# Patient Record
Sex: Female | Born: 1935 | Race: White | Hispanic: No | Smoking: Never smoker
Health system: Southern US, Community
[De-identification: ages and names within clinical notes are randomized; demographics above are authoritative.]

## PROBLEM LIST (undated history)

## (undated) DIAGNOSIS — J189 Pneumonia, unspecified organism: Secondary | ICD-10-CM

## (undated) DIAGNOSIS — I8390 Asymptomatic varicose veins of unspecified lower extremity: Secondary | ICD-10-CM

## (undated) DIAGNOSIS — H8110 Benign paroxysmal vertigo, unspecified ear: Secondary | ICD-10-CM

## (undated) DIAGNOSIS — R252 Cramp and spasm: Secondary | ICD-10-CM

## (undated) DIAGNOSIS — I10 Essential (primary) hypertension: Secondary | ICD-10-CM

## (undated) DIAGNOSIS — I639 Cerebral infarction, unspecified: Secondary | ICD-10-CM

## (undated) DIAGNOSIS — R112 Nausea with vomiting, unspecified: Secondary | ICD-10-CM

## (undated) DIAGNOSIS — Z8489 Family history of other specified conditions: Secondary | ICD-10-CM

## (undated) DIAGNOSIS — M719 Bursopathy, unspecified: Secondary | ICD-10-CM

## (undated) DIAGNOSIS — Z9889 Other specified postprocedural states: Secondary | ICD-10-CM

## (undated) DIAGNOSIS — A0472 Enterocolitis due to Clostridium difficile, not specified as recurrent: Secondary | ICD-10-CM

## (undated) DIAGNOSIS — M1990 Unspecified osteoarthritis, unspecified site: Secondary | ICD-10-CM

## (undated) HISTORY — PX: COLONOSCOPY: SHX174

## (undated) HISTORY — DX: Bursopathy, unspecified: M71.9

## (undated) HISTORY — PX: TUBAL LIGATION: SHX77

## (undated) HISTORY — PX: HERNIA REPAIR: SHX51

## (undated) HISTORY — DX: Cerebral infarction, unspecified: I63.9

## (undated) HISTORY — PX: ABDOMINAL HYSTERECTOMY: SHX81

## (undated) HISTORY — DX: Unspecified osteoarthritis, unspecified site: M19.90

## (undated) HISTORY — PX: KYPHOSIS SURGERY: SHX114

## (undated) HISTORY — DX: Essential (primary) hypertension: I10

## (undated) HISTORY — DX: Asymptomatic varicose veins of unspecified lower extremity: I83.90

## (undated) HISTORY — DX: Benign paroxysmal vertigo, unspecified ear: H81.10

## (undated) HISTORY — PX: APPENDECTOMY: SHX54

## (undated) HISTORY — PX: CATARACT EXTRACTION: SUR2

---

## 2004-03-22 ENCOUNTER — Emergency Department (HOSPITAL_COMMUNITY): Admission: EM | Admit: 2004-03-22 | Discharge: 2004-03-22 | Payer: Self-pay | Admitting: Emergency Medicine

## 2004-03-24 ENCOUNTER — Emergency Department (HOSPITAL_COMMUNITY): Admission: EM | Admit: 2004-03-24 | Discharge: 2004-03-24 | Payer: Self-pay | Admitting: Family Medicine

## 2005-01-13 ENCOUNTER — Encounter: Admission: RE | Admit: 2005-01-13 | Discharge: 2005-01-13 | Payer: Self-pay | Admitting: Geriatric Medicine

## 2005-02-09 ENCOUNTER — Encounter: Admission: RE | Admit: 2005-02-09 | Discharge: 2005-02-09 | Payer: Self-pay | Admitting: Geriatric Medicine

## 2005-04-15 ENCOUNTER — Encounter: Admission: RE | Admit: 2005-04-15 | Discharge: 2005-04-15 | Payer: Self-pay | Admitting: Geriatric Medicine

## 2005-04-24 ENCOUNTER — Ambulatory Visit (HOSPITAL_COMMUNITY): Admission: RE | Admit: 2005-04-24 | Discharge: 2005-04-24 | Payer: Self-pay | Admitting: Geriatric Medicine

## 2005-04-24 ENCOUNTER — Encounter (INDEPENDENT_AMBULATORY_CARE_PROVIDER_SITE_OTHER): Payer: Self-pay | Admitting: Specialist

## 2005-05-08 ENCOUNTER — Encounter: Payer: Self-pay | Admitting: Interventional Radiology

## 2005-06-29 ENCOUNTER — Encounter: Payer: Self-pay | Admitting: Interventional Radiology

## 2005-07-02 ENCOUNTER — Ambulatory Visit (HOSPITAL_COMMUNITY): Admission: RE | Admit: 2005-07-02 | Discharge: 2005-07-02 | Payer: Self-pay | Admitting: Interventional Radiology

## 2006-07-09 ENCOUNTER — Encounter: Admission: RE | Admit: 2006-07-09 | Discharge: 2006-07-09 | Payer: Self-pay | Admitting: Geriatric Medicine

## 2006-10-20 ENCOUNTER — Encounter: Admission: RE | Admit: 2006-10-20 | Discharge: 2006-10-20 | Payer: Self-pay | Admitting: General Surgery

## 2006-10-21 ENCOUNTER — Ambulatory Visit (HOSPITAL_BASED_OUTPATIENT_CLINIC_OR_DEPARTMENT_OTHER): Admission: RE | Admit: 2006-10-21 | Discharge: 2006-10-21 | Payer: Self-pay | Admitting: General Surgery

## 2007-07-12 ENCOUNTER — Encounter: Admission: RE | Admit: 2007-07-12 | Discharge: 2007-07-12 | Payer: Self-pay | Admitting: Geriatric Medicine

## 2007-10-04 ENCOUNTER — Encounter: Admission: RE | Admit: 2007-10-04 | Discharge: 2007-10-04 | Payer: Self-pay | Admitting: Geriatric Medicine

## 2007-10-15 ENCOUNTER — Inpatient Hospital Stay (HOSPITAL_COMMUNITY): Admission: EM | Admit: 2007-10-15 | Discharge: 2007-10-29 | Payer: Self-pay | Admitting: Emergency Medicine

## 2007-10-19 ENCOUNTER — Encounter (INDEPENDENT_AMBULATORY_CARE_PROVIDER_SITE_OTHER): Payer: Self-pay | Admitting: Gastroenterology

## 2009-02-25 ENCOUNTER — Encounter: Admission: RE | Admit: 2009-02-25 | Discharge: 2009-02-25 | Payer: Self-pay | Admitting: Geriatric Medicine

## 2010-01-09 ENCOUNTER — Encounter: Admission: RE | Admit: 2010-01-09 | Discharge: 2010-01-09 | Payer: Self-pay | Admitting: Geriatric Medicine

## 2011-05-12 NOTE — Op Note (Signed)
[name], [name] NO.:  [id number]   MEDICAL RECORD NO.:  [id number]          PATIENT TYPE:  INP   LOCATION:  6709                         FACILITY:  [facility]   PHYSICIAN:  Shirley Friar, MDDATE OF BIRTH:  August 22, 1928   DATE OF PROCEDURE:  10/19/2007  DATE OF DISCHARGE:                               OPERATIVE REPORT   PROCEDURE:  Colonoscopy.   INDICATIONS:  Chronic diarrhea.  Colonoscopy.   MEDICATIONS:  Fentanyl 75 mcg IV, Versed 2.5 mg IV.   FINDINGS:  A pediatric Pentax colonoscope was inserted into an  inadequately prepped colon and advanced to the cecum.  On insertion of  the colonoscope, there were scattered pseudomembranes noted in the  sigmoid colon and descending colon.  There were also pseudomembranes  noted in the rectum.  There was diffuse edema in the sigmoid colon as  well as scattered diverticula seen.  The colonoscope was easily advanced  through the colon down to the cecum where the ileocecal valve and  appendiceal orifice were identified.  In the cecum, were a few  pseudomembranes with white colored plaques as noted on the left side as  well.  In the ascending colon and transverse colon, there were no  identified pseudomembranes although the prep was not adequate to screen  the colon.  No mass lesions were noted and no obstruction was seen.  Random biopsies were taken on the right side and left side of the colon  for histology.   ASSESSMENT:  1. Pseudomembranous colitis mainly in the sigmoid colon but also noted      in cecum.  2. Edematous sigmoid colon secondary to pseudomembranous colitis.  3. Sigmoid diverticulosis.   PLAN:  1. Change Flagyl to 500 mg IV q.8h.  2. Continue p.o. vancomycin 250 mg p.o. q.i.d.  3. Clear liquid diet and advance as tolerated.  4. Follow-up on pathology.      Shirley Friar, MD  Electronically Signed     VCS/MEDQ  D:  10/19/2007  T:  10/20/2007  Job:  [id number]   cc:   Hal T.  Stoneking, M.D.

## 2011-05-12 NOTE — Consult Note (Signed)
[name], [name] NO.:  [id number]   MEDICAL RECORD NO.:  [id number]          PATIENT TYPE:  INP   LOCATION:  6709                         FACILITY:  [facility]   PHYSICIAN:  Shirley Friar, MDDATE OF BIRTH:  Jun 02, 1928   DATE OF CONSULTATION:  10/17/2007  DATE OF DISCHARGE:                                 CONSULTATION   HISTORY OF PRESENT ILLNESS:  Dr. Bosie Clos was asked to see Ms.  [name] today by Dr. Adela Glimpse in consultation for chronic diarrhea,  possible C.  Diff colitis.   HISTORY OF PRESENT ILLNESS:  This is a 77 year old female who reports  that prior to one month ago her bowel movements were soft and once a  day.  Since that time, she was bitten by a dog in her right hand and  suffered severe swelling.  She was started on amoxicillin which caused  severe diarrhea.  The amoxicillin was changed to clindamycin.  The  diarrhea continued.  Her stools are now very urgent and frequent.  She  has 10 or more per day.  They are semisolid.  They are yellow-brown in  color.  There is no blood, but they are guaiac-positive.  Today she is  positive for fever.  She is complaining of abdominal distention,  cramping in her groin.  She says I feel like I have a blockage in my  rectum, and I also am sore from straining.  She has no previous history  of such symptoms, her only vomiting was secondary to Percocet that was  given on Saturday.  She has been on Flagyl for the past 3 days.  Her  right hand is still very painful from her dog bite, and recent MRI shows  inflammation.  She was supposed to see Dr. Amanda Pea as an outpatient for a  biopsy.   PAST MEDICAL HISTORY:  1. Osteoporosis and chronic pain.  2. Mild hypertension.  3. Inguinal hernia repair.  4. Hysterectomy.  5. Bilateral tubal ligation.  6. Cataract surgery.  7. Appendectomy.  8. Shoulder surgery.  9. Vertigo approximately one month ago that she says it is better now.  10.Primary care physician is  Dr. Merlene Laughter.  She has never had a      colonoscopy despite by Dr. Laverle Hobby recommendation.   ALLERGIES:  PENICILLIN, AMOXICILLIN, CLINDAMYCIN AND CIPRO.  SHE ALSO  HAS ALLERGIES TO MANY NARCOTICS PERCOCET IS ONE OF THEM.   CURRENT MEDICATIONS:  Premarin, HCTZ, Fosamax, multivitamin, Flagyl x3  days and Advil.   REVIEW OF SYSTEMS:  Significant for severe osteoarthritis pain and also  pain and inflammation in her right hand.   SOCIAL HISTORY:  She lives alone.  She has no history of drug, alcohol  or tobacco use.   FAMILY HISTORY:  Negative for colon cancer.   PHYSICAL EXAMINATION:  GENERAL:  She is alert, oriented and in no  apparent distress.  HEART:  Regular rate and rhythm.  LUNGS:  Clear to auscultation bilaterally.  ABDOMEN:  Distended, somewhat firm, tender in the lower quadrant to mild  palpation.  She has decreased bowel sounds.  CURRENT VITAL SIGNS:  Her  temperature right now is 102.0, pulse 104,  respirations 19, blood pressure is 118/69.   LABORATORY DATA:  Labs show a sodium 133, potassium 3.7, chloride 101,  bicarb 23, BUN 5, creatinine 0.82, glucose 95.  Her hemoglobin is 11.1,  white count 9.8.  Her free T3 is 1.9.  She has been guaiac positive  times four on October 18 and 19.  Current radiological studies are a CT  that was done on October 18 that showed the patient was constipated, but  with no obstruction in her colon.  She has extensive sigmoid  diverticulosis.  X-ray of her abdomen on the 18th showed constipation as  well.   ASSESSMENT:  Dr. Charlott Rakes has seen and examined the patient,  collected a history and reviewed the chart.  His impression is that this  is a 77 year old female who is experiencing severe constipation despite  her loose stools which may very well be overflow diarrhea or C. diff  colitis.  These stools are guaiac-positive and she currently has fever.  Stool cultures are pending.  The patient will need laxatives to help   clear her colon.  A possible ID consult may be necessary considering her  many antibiotic allergies if her C diff stool studies come back  positive.      Stephani Police, [place]      Shirley Friar, MD  Electronically Signed    MLY/MEDQ  D:  10/17/2007  T:  10/18/2007  Job:  [id number]   cc:   Hal T. Stoneking, M.D.

## 2011-05-12 NOTE — H&P (Signed)
NAMEJAYLIANI, Kimberly NO.:  [id number]   MEDICAL RECORD NO.:  [id number]          PATIENT TYPE:  EMS   LOCATION:  MAJO                         FACILITY:  [facility]   PHYSICIAN:  [name], MDDATE OF BIRTH:  1928/07/23   DATE OF ADMISSION:  10/15/2007  DATE OF DISCHARGE:                              HISTORY & PHYSICAL   PRIMARY CARE PHYSICIAN:  Dr. Pete Glatter.   HISTORY OF PRESENT ILLNESS:  This is a 77 year old female with past  medical history significant for mild hypertension and recent antibiotic  use for a dog bite, presents with refractory diarrhea for 4 weeks  despite use of Imodium.   One month ago, the patient had sustained a dog bite and was started on  amoxicillin.  She developed diarrhea shortly thereafter, and took some  Imodium and then got better.  The patient was seen again in the office  and was given clindamycin and Cipro for her hand, and shortly thereafter  developed diarrhea again, which she attempted to treat with Imodium with  some improvement.  Then, one week ago, the patient started to have some  nausea, and then rapidly developed mucus diarrhea with no blood in  stools, as well as chills at night.  The patient was seen by Dr.  Laverle Hobby office, and was prescribed Flagyl for what presumptively is  possible C. diff.  The patient was also given Lomotil to control her  diarrhea, which has not helped.  Now, the patient presents at the  emergency department because her diarrhea has not improved, and she has  not been able to tolerate much p.o. for the past one week.   ALLERGIES:  PENICILLIN.   MEDICATIONS:  1. Premarin 0.45 mg p.o. daily.  2. Hydrochlorothiazide 12.5 mg p.o. daily.  3. Fosamax 70 mg p.o. once a week.  4. Multivitamins.  5. Flagyl.  6. Advil.   PAST MEDICAL HISTORY:  Significant for mild hypertension.   REVIEW OF SYSTEMS:  Negative for fever, but does report chills.  Negative for chest pain, shortness of  breath.  Occasional headache one  week ago, but currently improved.  No shortness of breath.  No  orthostasis, but does have occasional vertigo.  Denies cough.  Denies  dysuria.  No neurological abnormalities.  Reports nausea and vomiting,  as well as diarrhea per HPI.  Denies melena or red blood per rectum.   SOCIAL HISTORY:  Denies tobacco or alcohol.  Lives at home.  No drug  use.   FAMILY HISTORY:  Noncontributory.   PHYSICAL EXAMINATION:  VITAL SIGNS:  Temperature 98.7, pulse 103,  respirations 16, blood pressure 94/82, improved to 122/85 with fluids.  GENERAL:  Well-appearing female in no acute distress.  HEENT:  Dry mucous membranes.  HEART:  No murmurs, rubs or gallops.  Regular, but rapid.  LUNGS:  Clear to auscultation bilaterally.  SKIN:  No rashes.  ABDOMEN:  Tender over left lower quadrant, as well as somewhat right  lower quadrant.  No guarding.  No peritoneal signs.  LOWER EXTREMITIES:  Without edema.  RECTAL:  Trace heme-positive.  NEUROLOGICAL:  Nonfocal.   RADIOLOGICAL STUDIES:  KUB and chest possible COPD, nonspecific gas  pattern, possible left wall infusion and basil atelectasis.   LABORATORY DATA:  White blood cell count 7.0, H&H 11.2 and 33.6,  platelets 352.  Sodium 132, potassium 3.5, creatinine 1.0.   ASSESSMENT AND PLAN:  This is a 77 year old female with history of  hypertension, and now with diarrhea, possible secondary to Clostridium  difficile.  1. We will admit to hospital.  Rehydrate with normal saline at 125.      We will order a Clostridium difficile stool cultures, stool white      blood cell count or fecal leukocytes.  Given abdominal tenderness,      we will also evaluate the abdomen with CT with contrast.  We will      continue Flagyl here while in hospital.  2. Anemia:  The patient is mildly anemic.  Hemoccult is mildly      positive.  Hard to judge in this patient given the patient has been      having diarrhea and a lot of rectal  irritation.  We will order      anemia panel.  Currently, the patient is hemodynamically stable.  3. Dehydration:  We will rehydrate.  We will check orthostatics.  4. Prophylaxis:  We will put on sequential compression devices and      Protonix.  5. The patient is full code during this admission.      Kimberly Cowboy, MD  Electronically Signed     AVD/MEDQ  D:  10/15/2007  T:  10/16/2007  Job:  [id number]   cc:   Hal T. Stoneking, M.D.

## 2011-05-12 NOTE — Discharge Summary (Signed)
[name], [name] NO.:  [id number]   MEDICAL RECORD NO.:  [id number]          PATIENT TYPE:  INP   LOCATION:  6709                         FACILITY:  [facility]   PHYSICIAN:  Ramiro Harvest, MD    DATE OF BIRTH:  [birth year]   DATE OF ADMISSION:  10/15/2007  DATE OF DISCHARGE:  10/29/2007                               DISCHARGE SUMMARY   PRIMARY CARE PHYSICIAN:  Hal T. Stoneking, M.D.   DISCHARGE DIAGNOSES:  1. Pseudomembranous colitis secondary to Clostridium difficile.  2. Bilateral lower lobe pneumonia.  3. Hypokalemia.  4. Hypertension.  5. Anemia.  6. Dehydration.  7. Gout versus pseudogout.  8. Osteoporosis and chronic pain.   DISCHARGE MEDICATIONS:  1. Advil as needed.  2. Fosamax 70 mg weekly.  3. Multivitamins daily.  4. Premarin 0.45 mg daily.   DISPOSITION AND FOLLOWUP:  Patient is to schedule a followup with  gastroenterologist, Dr. Laural Benes, in 1 week.  On followup basic metabolic  profile needs to be checked to follow patient's electrolytes and a CBC  needs to be checked to follow patient's hemoglobin.  Patient is also to  follow up with her primary care physician, Dr. Merlene Laughter as  scheduled.  On followup PCP needs to reassess patient's blood pressure  and decide whether patient needs to be restarted on her low dose  diuretic of hydrochlorothiazide as patient will not be discharged home  on any antihypertensives as her blood pressures have been in the 120s  throughout the hospitalization.  A followup CBC will also have to be  monitored to follow patient's hemoglobin and platelets as patient may  have had a reactive thrombocytosis.   PROCEDURES PERFORMED:  1. Colonoscopy was performed on October 19, 2007.  Findings on the      colonoscopy was pseudomembranous colitis, mainly in the sigmoid      colon, also noted in the cecum edematous sigmoid colon secondary to      pseudomembranous colitis and sigmoid diverticulosis.  2. A 2-D echo  was obtained on October 20, 2007 which showed left      ventricular systolic function with normal EF 60-65%.  No evidence      of left ventricular regional wall motion abnormalities, mild aortic      valvular regurgitation, mild mitral valvular regurgitation, left      atrial size upper limits of normal, mild to moderate tricuspid      valvular regurgitation and right atrium was mildly dilated.      Estimated peak pulmonary artery systolic pressure was moderately      increased.  3. An acute abdominal series was done on October 21, 2007 which showed      a nonobstructive bowel gas pattern, probable __________  disease.      There may be a small amount of left pleural fluids versus basilar      atelectasis.  4. CT of the abdomen was done on October 15, 2007 that showed      constipation.  No acute abnormality.  5. CT of the pelvis was done on October 15, 2007 that showed chronic  sigmoid diverticulosis and constipation.  No acute abnormality.  6. Two views of the abdomen were done October 18, 2007 which showed a      nonspecific bowel gas pattern.  7. Chest x-ray was done October 20, 2007 that showed bilateral lower      lobe pneumonia and bilateral effusions.  8. Chest x-ray done on October 23, 2007 showed slight decrease in left      effusion with persistent bilateral effusions, bibasilar      consolidation and atelectasis.  9. A chest x-ray was done October 24, 2007 that showed bilateral      pleural effusions, right greater than left with bibasilar      atelectasis.  10.October 25, 2007 chest x-ray was done that showed persistent right-      sided pleural effusion with possible loculation laterally, improved      or resolved small left pleural effusion with persistent right      greater than left bibasilar air-space disease.  11.CT of the chest done October 17, 2007 showed small bilateral      pleural effusions and bibasilar atelectasis, right greater than      left, small  pericardial effusion, mild mediastinal adenopathy.   CONSULTATIONS:  Gastroenterology consult was done on October 17, 2007.  Patient was seen in consultation by Dr. Charlott Rakes and later on  followed by Dr. Laural Benes.   ADMISSION HISTORY AND PHYSICAL:  As done per Dr. Adela Glimpse, patient is a  77 year old female with a past medical history significant for mild  hypertension and recent antibiotic use secondary to a dog bite who  presented with refractory diarrhea x4, resolved by use of Imodium.  One  prior to admission patient had sustained a dog bite.  Was started on  amoxicillin.  Patient developed diarrhea shortly thereafter.  Took some  Imodium and got better.  Patient was seen in the office and was put on  clindamycin and Cipro for her hand and shortly thereafter developed  diarrhea again, which she attempted to treat with Imodium with some  improvement.  One week prior to admission, patient started to have some  nausea and then rapidly developed mucous diarrhea with no blood in the  stools as well as chills at night.  Patient was seen by Dr. Laverle Hobby  office, was prescribed Flagyl for what presumptively was possibly C.  diff.  Patient was also given Lomotil to control her diarrhea, which  gave no relief.  Patient now presents to the emergency room because her  diarrhea has not improved and she has not been able to tolerate any p.o.  for the past week.   PHYSICAL EXAMINATION:  VITAL SIGNS:  Temperature of 98.7, pulse of 103,  respirations 16, blood pressure 94/82 which increased to 122/85 with  fluid boluses.  GENERAL:  Patient is a well-appearing female in no apparent distress.  HEENT:  Normocephalic and atraumatic.  Pupils equal, round and reactive  to light.  Extraocular movements were intact.  Oropharynx was clear with  dry mucous membranes.  HEART:  Tachycardic but regular and normal rhythm.  No murmurs, rubs or  gallops.  RESPIRATORY:  Lungs are clear to auscultation  bilaterally.  ABDOMEN:  Soft, some tenderness over the left lower quadrant and the  right lower quadrant.  No guarding.  No rebound.  No peritoneal signs.  EXTREMITIES:  Lower extremities:  No clubbing, cyanosis or edema.  RECTAL:  Trace heme-positive.  NEUROLOGICAL:O  Patient was alert and oriented x3.  Cranial nerves II-  XII were grossly intact.  No focal deficits.   ADMISSION LABS:  A KUB and chest x-ray showed possible COPD and  nonspecific gas pattern, possible left wall effusion and bibasilar  atelectasis.  CBC:  White count 7, hemoglobin 11.2, hematocrit 33.6,  platelets of 352, ANC of 5.5, absolute lymphocytes of 0.6, absolute  monocytes of 0.9.  Her urinalysis was amber, cloudy.  Specific gravity  of 1.014, pH of 6, glucose negative.  Bilirubin small.  Ketones  negative.  Blood negative.  Protein negative.  Urobilinogen 0.2,  nitrites negative.  Small leukocyte esterase.  Microscopy:  3-6 white  blood cells.  Rare bacteria.  Comprehensive metabolic profile showed a  sodium of 132, potassium of 3.7, chloride of 100, bicarb 26, BUN 7,  creatinine 0.71, glucose of 99, bilirubin of 0.6, alkaline phosphatase  51, AST 14, ALT 11, total protein 5.9, albumin 2.7 and calcium of 7.8.  PT of 15.1, INR of 1.2, PTT of 37.  FOBT was positive and anemia panel  showed a reticulocyte of 2.3, absolute reticulocytes of 90.2, iron of  12, TIBC of 202, percent saturations of 6, vitamin B12 of 857, serum  folate greater than 20 and ferritin of 47.   HOSPITAL COURSE:  1. Pseudomembranous colitis.  Patient had presented as stated above      with symptoms of diarrhea after use of antibiotics.  Patient was      admitted into the hospital.  Patient's stools were guaiac-positive.      Stool cultures were also obtained.  Patient had fevers during the      initial phase of her hospitalization.  Stool cultures were      negative.  Blood cultures were negative.  The stool was checked for      C. diff which  was positive.  Patient was then started on Flagyl      p.o.  CT of the abdomen was also obtained and plain films of the      abdomen were obtained with the results as stated above.      Consultation was done per gastroenterology.  Patient was seen in      consultation by Dr. Bosie Clos.  Patient had a colonoscopy which was      done on October 19, 2007 with results as stated above.  Patient's      Flagyl was then switched to IV Flagyl.  Vancomycin was also added      to patient's regimen and a rectal pouch was placed on the patient      on October 17, 2007 and later removed on October 24, 2007.  Patient      was also placed on Questran and probiotics.  Patient was still      having diarrhea.  Her symptoms improved slowly throughout the      hospitalization.  The frequency of her diarrhea decreased and      stools started to become more formed.  Flagyl was discontinued on      October 27.  Patient was maintained on oral vancomycin.  The      patient had a total 10-day course of oral vancomycin.  Patient was      followed throughout the hospitalization by Dr. Laural Benes of      gastroenterology.  Patient's symptoms improved.  The frequency of      her diarrhea decreased and the stools became a little more formed.      Patient will be  discharged home in stable and improved condition to      follow up with Dr. Laural Benes in 1 week for reassessment of her      pseudomembranous colitis.  Patient has had a full course of      antibiotics and will not need any more antibiotics on discharge.      Patient will be discharged in stable and improved condition.  2. Bilateral lower lobe pneumonia.  During the hospitalization on      October 20, 2007 patient started to develop some shortness of      breath.  It was felt that patient may also be volume overloaded      from volume resuscitation secondary to dehydration.  Patient's IV      fluids were saline locked.  A chest x-ray was obtained with the      results  as stated above which showed that patient had bilateral      pneumonia.  A 2-D echocardiogram was also obtained which was      normal.  Patient was then started on Avelox and treated for 5 days      on Avelox for bilateral lower lobe pneumonia and also given a dose      of Lasix secondary to some pleural effusions.  Chest x-ray was      obtained.  CT scans were also done to make sure patient did not      have loculated effusion with the results as stated above.      Patient's condition improved.  Patient's oxygenation improved.  On      the day of discharge, patient was in improved and stable condition.      Patient no longer had any oxygen requirements prior to discharge.      Patient was discharged in stable and improved condition.  3. Hypokalemia.  Patient was hypokalemic on admission.  This was      thought to be secondary to problem #1.  Patient's potassium was      repleted.  On the day of discharge, patient's potassium had been      repleted.  4. Hypertension.  Throughout the hospitalization, the patient was not      restarted on hydrochlorothiazide secondary to volume depletion.      Patient's volume was resuscitated with IV fluids.  Patient's blood      pressure remained in the low 100s to 120s and improved steadily.      Patient's blood pressure for the rest of the hospitalization      remained in the 120s.  Patient was discharged home and not put on      antihypertensives.  This will be deferred to her primary care      physician on followup where her blood pressure will be reassessed,      and if needed, patient may be restarted back on her low dose      hydrochlorothiazide.  5. Anemia.  Patient was anemic on presentation.  It was thought      possibly secondary to diarrhea.  Patient's hemoglobin remained      stable throughout hospitalization and patient was discharged in      stable and improved condition.  6. Dehydration.  Patient presented dehydrated, likely secondary  to      problem #1.  Patient was fluid resuscitated.  Patient then started      tolerating p.o. and was discharged in stable and improved      condition.  The rest of the patient's medical problems were stable      throughout the hospitalization.  Patient will be discharged in      stable and improved condition.   DISCHARGE VITAL SIGNS:  On the day of discharge, patient had a  temperature of 98.6, pulse of 98, blood pressure 133/84, respirations of  21, saturating 97% on room air.   DISCHARGE LABS:  Basic metabolic profile:  Sodium 133, potassium 4,  chloride 101, bicarb 26, BUN 7, creatinine 0.64, glucose of 122 and a  calcium of 8.8, white count of 10.8, hemoglobin of 11.4, hematocrit of  34.2 and a platelet count of 716, magnesium level of 2.1.   It has been a pleasure taking care of Mrs. Kevyn Rabelo.      Ramiro Harvest, MD  Electronically Signed     DT/MEDQ  D:  10/29/2007  T:  10/30/2007  Job:  [id number]   cc:   Hal T. Stoneking, M.D.  Danise Edge, M.D.

## 2011-05-15 NOTE — Op Note (Signed)
[name], HADA NO.:  [id number]   MEDICAL RECORD NO.:  [id number]          PATIENT TYPE:  AMB   LOCATION:  DSC                          FACILITY:  [facility]   PHYSICIAN:  Adolph Pollack, M.D.DATE OF BIRTH:  20-Aug-1928   DATE OF PROCEDURE:  10/21/2006  DATE OF DISCHARGE:                                 OPERATIVE REPORT   PREOPERATIVE DIAGNOSIS:  Right inguinal hernia.   POSTOPERATIVE DIAGNOSIS:  Direct right inguinal hernia   PROCEDURE:  Right inguinal hernia repair with mesh.   SURGEON:  Adolph Pollack, M.D.   ANESTHESIA:  General plus 0.5% Marcaine local.   INDICATIONS:  A 77 year old female has had a right inguinal bulge that she  is having to reduce and wear tight underwear for.  It is symptomatic.  It is  a right inguinal hernia by examine and she now presents for repair.  We  discussed the procedure risks and aftercare preoperatively.   TECHNIQUE:  She was seen in the holding area and the right groin marked with  my initials.  She is brought to the operating room, placed supine on the  operating table and general anesthetic was administered.  Hair on the right  groin was clipped and the area sterilely prepped and draped.  Marcaine  solution was infiltrated superficially and deep in the right groin.  A right  groin incision was made dividing the skin, subcutaneous tissue and Scarpa  fascia.  The external oblique aponeurosis was identified and local  anesthetic infiltrated deep to that.  Scarring from previous Pfannenstiel  incision between the internal and external oblique aponeurosis was noted.  I  dissected this and freed and separated the two using electrocautery.  An  incision was made in the external oblique aponeurosis through the external  ring area medially and up toward the anterior superior iliac spine  laterally.  Blunt dissection was used at this level to expose the external  oblique muscle and aponeurosis superiorly and the  shelving edge of the  inguinal ligament inferiorly.  The ilioinguinal nerve was identified and  retracted inferiorly.   A large direct defect was noted with extraperitoneal fat but was reducible.  I identified the round ligament and isolated this.  Once I had reduced of  the hernia contents back through the defect, I brought a 3 x 6 inch piece of  polypropylene mesh to the field and anchored it 1 cm medial to the pubic  tubercle with 2-0 Prolene suture.  The inferior edge of the mesh was then  anchored to the shelving edge of the inguinal ligament with a running 2-0  Prolene suture well lateral of the defect.  A slit was cut in the mesh and  the tails were wrapped around the round ligament.  Superior aspect of the  mesh was anchored to the internal oblique aponeurosis with interrupted 2-0  Vicryl sutures.  The two tails of the mesh were crossed and anchored to the  shelving edge of the inguinal ligament with 2-0 Prolene suture.  The lateral  aspect of the mesh was tucked deep to the external oblique aponeurosis.  This provided for more than adequate coverage of the defect with good  overlap.  Hemostasis was adequate.  The external oblique aponeurosis was  closed over the mesh and round ligament with a running 3-0 Vicryl suture.  Scarpa's fascia was closed with running 2-0 Vicryl suture.  The skin was  closed with 4-0 Monocryl subcuticular stitch followed by Steri-Strips and  sterile dressing.   She tolerated the procedure well without any apparent complications and was  taken to the recovery room in satisfactory condition.      Adolph Pollack, M.D.  Electronically Signed     TJR/MEDQ  D:  10/21/2006  T:  10/22/2006  Job:  [id number]   cc:   Hal T. Stoneking, M.D.

## 2011-05-15 NOTE — Consult Note (Signed)
[name], [name] NO.:  [id number]   MEDICAL RECORD NO.:  [id number]          PATIENT TYPE:  OUT   LOCATION:  XRAY                         FACILITY:  [facility]   PHYSICIAN:  Sanjeev K. Deveshwar, M.D.DATE OF BIRTH:  November 06, 1928   DATE OF CONSULTATION:  04/21/2005  DATE OF DISCHARGE:                                   CONSULTATION   CHIEF COMPLAINT:  T10 compression fracture.   HISTORY OF PRESENT ILLNESS:  This is a 77 year old female followed by Dr.  Pete Glatter and referred to Dr. Corliss Skains for evaluation of a compression  fracture.  The patient fell on March 27, 2005 while trying to catch a dog  that had escaped.  She gradually developed some back pain over the next  several weeks.  An MRI was performed on April 15, 2005 that showed a mild  subacute inferior endplate compression fracture at T10, consistent with  osteoporosis.  There was focal disk bulging.  Since that time the patient's  pain has become progressively worse.  She is intolerant to many narcotic  pain medications, as they cause nausea and vomiting.  She has tolerated  Talwin in the past; however, this is not providing much relief.  She is also  taking Advil; however, the pain is quite severe and very debilitating.  Her  activity has been extremely limited.  The pain waxes and wanes, but at its  worse she rates it on a 10 on a 1-10 scale.   PAST MEDICAL HISTORY:  The patient has been fairly healthy.  She denies  diabetes, hyperlipidemia.  She has never had a stroke.  She has no  hypertension.  No known coronary disease.  No peptic ulcer disease.  No  gastroesophageal reflux disease.  No known cancer.  She does not have COPD.   The patient does have osteoporosis.  She has had an inguinal hernia repair.  She has some venous insufficiency. She is status post hysterectomy.  She had  cataract surgery, bilateral tubal ligation.  She is status post appendectomy  and shoulder surgery.   ALLERGIES:  The  patient is allergic to PENICILLIN (which causes a rash).  As  noted, she is INTOLERANT TO MANY NARCOTICS (which cause nausea and  vomiting).   CURRENT MEDICATIONS:  Calcium, multivitamin, Detrol LA 4 mg p.r.n.   SOCIAL HISTORY:  The patient is widowed.  Her husband recently died several  weeks ago from lung cancer.  She has three children and she lives alone in  [place].  She does not use alcohol or tobacco.   FAMILY HISTORY:  Her mother died from dementia.  Her father died in his 90's  from coronary disease.  She has two siblings who are alive and well.   REVIEW OF SYSTEMS:  Negative, except for the following:  She has had some  dizziness, she feels is related to her pain medications.  She has a previous  history of nosebleed.  She does have some arthritis.  She has been depressed  over the loss of her husband, which is understandable.  She has had  difficulty with her gait, secondary to her compression  fracture and obvious  pain.   IMPRESSION:  1.  T10 compression fracture.  2.  Osteoporosis.  3.  Status post multiple surgery.  4.  History of venous insufficiency.  5.  Recent depression, due to loss of her husband.  6.  Intolerant to many narcotic pain medications.   PLAN:  The patient was seen in consultation by Dr. Corliss Skains.  She was  accompanied by her daughter-in-law.  We had a long discussion regarding her  compression fracture, the possible causes and treatment options.  Risks and  benefit of kyphoplasty/vertebroplasty were discussed, and the patient and  her daughter agreed that they wanted the patient to be scheduled for this  procedure.  She has been scheduled for this coming Friday, April 24, 2005;  to undergo the kyphoplasty for the above-noted compression fracture.  The  risks and benefits of the procedure have been discussed.  They agree to  proceed.      DR/MEDQ  D:  04/21/2005  T:  04/21/2005  Job:  [id number]

## 2011-09-25 ENCOUNTER — Other Ambulatory Visit: Payer: Self-pay

## 2011-09-25 DIAGNOSIS — I83893 Varicose veins of bilateral lower extremities with other complications: Secondary | ICD-10-CM

## 2011-10-07 LAB — BASIC METABOLIC PANEL
BUN: 2 — ABNORMAL LOW
BUN: 3 — ABNORMAL LOW
CO2: 23
CO2: 27
CO2: 28
CO2: 29
CO2: 31
Calcium: 6.9 — ABNORMAL LOW
Calcium: 7.2 — ABNORMAL LOW
Calcium: 7.4 — ABNORMAL LOW
Calcium: 7.5 — ABNORMAL LOW
Calcium: 7.8 — ABNORMAL LOW
Calcium: 8 — ABNORMAL LOW
Calcium: 8.3 — ABNORMAL LOW
Chloride: 100
Chloride: 101
Chloride: 101
Chloride: 104
Creatinine, Ser: 0.68
Creatinine, Ser: 0.71
Creatinine, Ser: 0.8
Creatinine, Ser: 0.82
GFR calc Af Amer: >60
GFR calc Af Amer: >60
GFR calc Af Amer: >60
GFR calc Af Amer: >60
GFR calc Af Amer: >60
GFR calc Af Amer: >60
GFR calc Af Amer: >60
GFR calc non Af Amer: >60
GFR calc non Af Amer: >60
GFR calc non Af Amer: >60
GFR calc non Af Amer: >60
GFR calc non Af Amer: >60
GFR calc non Af Amer: >60
GFR calc non Af Amer: >60
GFR calc non Af Amer: >60
Glucose, Bld: 103 — ABNORMAL HIGH
Glucose, Bld: 149 — ABNORMAL HIGH
Glucose, Bld: 95
Glucose, Bld: 97
Glucose, Bld: 97
Potassium: 3 — ABNORMAL LOW
Potassium: 3.2 — ABNORMAL LOW
Potassium: 3.4 — ABNORMAL LOW
Potassium: 3.4 — ABNORMAL LOW
Potassium: 3.6
Potassium: 3.7
Potassium: 3.7
Potassium: 4
Sodium: 129 — ABNORMAL LOW
Sodium: 133 — ABNORMAL LOW
Sodium: 134 — ABNORMAL LOW
Sodium: 134 — ABNORMAL LOW
Sodium: 135
Sodium: 135
Sodium: 137
Sodium: 137

## 2011-10-07 LAB — CBC
HCT: 30.6 — ABNORMAL LOW
HCT: 31.2 — ABNORMAL LOW
HCT: 32.3 — ABNORMAL LOW
HCT: 33.6 — ABNORMAL LOW
HCT: 34 — ABNORMAL LOW
HCT: 34.2 — ABNORMAL LOW
Hemoglobin: 10.3 — ABNORMAL LOW
Hemoglobin: 10.3 — ABNORMAL LOW
Hemoglobin: 10.4 — ABNORMAL LOW
Hemoglobin: 10.7 — ABNORMAL LOW
Hemoglobin: 11.2 — ABNORMAL LOW
Hemoglobin: 11.2 — ABNORMAL LOW
Hemoglobin: 11.3 — ABNORMAL LOW
MCHC: 33.2
MCHC: 33.2
MCHC: 33.3
MCHC: 33.4
MCHC: 33.5
MCHC: 33.6
MCHC: 33.7
MCV: 86.7
MCV: 86.7
MCV: 86.8
MCV: 86.9
MCV: 87.4
Platelets: 352
Platelets: 373
Platelets: 408 — ABNORMAL HIGH
Platelets: 668 — ABNORMAL HIGH
RBC: 3.53 — ABNORMAL LOW
RBC: 3.56 — ABNORMAL LOW
RBC: 3.84 — ABNORMAL LOW
RBC: 3.84 — ABNORMAL LOW
RBC: 3.88
RBC: 3.94
RBC: 3.97
RBC: 4
RDW: 13.9
RDW: 14.4 — ABNORMAL HIGH
RDW: 14.5 — ABNORMAL HIGH
RDW: 14.5 — ABNORMAL HIGH
RDW: 14.6 — ABNORMAL HIGH
RDW: 14.8 — ABNORMAL HIGH
WBC: 10
WBC: 10.8 — ABNORMAL HIGH
WBC: 11 — ABNORMAL HIGH
WBC: 12.2 — ABNORMAL HIGH
WBC: 7
WBC: 8.1
WBC: 8.2
WBC: 9.8

## 2011-10-07 LAB — CLOSTRIDIUM DIFFICILE EIA

## 2011-10-07 LAB — PROTIME-INR: Prothrombin Time: 15.1

## 2011-10-07 LAB — DIFFERENTIAL
Basophils Absolute: 0
Basophils Relative: 0
Eosinophils Absolute: 0
Eosinophils Relative: 1
Lymphocytes Relative: 8 — ABNORMAL LOW
Lymphs Abs: 0.6 — ABNORMAL LOW
Monocytes Absolute: 0.9 — ABNORMAL HIGH
Monocytes Relative: 12 — ABNORMAL HIGH
Neutro Abs: 5.5
Neutrophils Relative %: 79 — ABNORMAL HIGH

## 2011-10-07 LAB — COMPREHENSIVE METABOLIC PANEL
ALT: 11
ALT: 17
AST: 27
Albumin: 2.1 — ABNORMAL LOW
BUN: 1 — ABNORMAL LOW
CO2: 23
Calcium: 7.8 — ABNORMAL LOW
Chloride: 103
Creatinine, Ser: 0.71
GFR calc Af Amer: >60
GFR calc non Af Amer: >60
GFR calc non Af Amer: >60
Glucose, Bld: 104 — ABNORMAL HIGH
Glucose, Bld: 99
Potassium: 3.4 — ABNORMAL LOW
Sodium: 132 — ABNORMAL LOW
Total Protein: 4.6 — ABNORMAL LOW
Total Protein: 5.9 — ABNORMAL LOW

## 2011-10-07 LAB — IRON AND TIBC
Iron: 12 — ABNORMAL LOW
Saturation Ratios: 6 — ABNORMAL LOW
UIBC: 190

## 2011-10-07 LAB — OSMOLALITY, STOOL: Osmolality,Stl: 366 mosm/kg — ABNORMAL HIGH (ref 220–280)

## 2011-10-07 LAB — FECAL LACTOFERRIN, QUANT: Fecal Lactoferrin: POSITIVE

## 2011-10-07 LAB — POTASSIUM, STOOL: Potassium, Stl: 35 meq/kg (ref 0–200)

## 2011-10-07 LAB — URINALYSIS, ROUTINE W REFLEX MICROSCOPIC
Glucose, UA: NEGATIVE
Hgb urine dipstick: NEGATIVE
Ketones, ur: NEGATIVE
Nitrite: NEGATIVE
Protein, ur: NEGATIVE
Specific Gravity, Urine: 1.014
Urobilinogen, UA: 0.2
pH: 6

## 2011-10-07 LAB — I-STAT 8, (EC8 V) (CONVERTED LAB)
Bicarbonate: 24.8 — ABNORMAL HIGH
Glucose, Bld: 129 — ABNORMAL HIGH
HCT: 36
Operator id: 285491
Sodium: 132 — ABNORMAL LOW

## 2011-10-07 LAB — URINE MICROSCOPIC-ADD ON

## 2011-10-07 LAB — FERRITIN: Ferritin: 47 (ref 10–291)

## 2011-10-07 LAB — CULTURE, BLOOD (ROUTINE X 2): Culture: NO GROWTH

## 2011-10-07 LAB — POCT I-STAT CREATININE
Creatinine, Ser: 1
Operator id: 285491

## 2011-10-07 LAB — OCCULT BLOOD X 1 CARD TO LAB, STOOL
Fecal Occult Bld: POSITIVE
Fecal Occult Bld: POSITIVE
Fecal Occult Bld: POSITIVE

## 2011-10-07 LAB — STOOL CULTURE

## 2011-10-07 LAB — MAGNESIUM: Magnesium: 2.1

## 2011-10-07 LAB — RETICULOCYTES
RBC.: 3.92
Retic Count, Absolute: 90.2
Retic Ct Pct: 2.3

## 2011-10-07 LAB — SODIUM, STOOL: Sodium, Stl: 77 meq/kg (ref 0–160)

## 2011-11-02 ENCOUNTER — Other Ambulatory Visit: Payer: Self-pay | Admitting: Geriatric Medicine

## 2011-11-02 DIAGNOSIS — M25552 Pain in left hip: Secondary | ICD-10-CM

## 2011-11-03 ENCOUNTER — Ambulatory Visit
Admission: RE | Admit: 2011-11-03 | Discharge: 2011-11-03 | Disposition: A | Discharge status: Home / Self Care | Payer: Medicare Other | Source: Ambulatory Visit | Attending: Geriatric Medicine | Admitting: Geriatric Medicine

## 2011-11-03 DIAGNOSIS — M25552 Pain in left hip: Secondary | ICD-10-CM

## 2011-11-12 ENCOUNTER — Encounter: Payer: Self-pay | Admitting: Vascular Surgery

## 2011-12-09 ENCOUNTER — Other Ambulatory Visit: Payer: Self-pay

## 2011-12-09 ENCOUNTER — Encounter: Payer: Self-pay | Admitting: Vascular Surgery

## 2011-12-10 ENCOUNTER — Other Ambulatory Visit: Payer: Self-pay

## 2011-12-10 ENCOUNTER — Encounter: Payer: Self-pay | Admitting: Vascular Surgery

## 2011-12-30 DIAGNOSIS — M76899 Other specified enthesopathies of unspecified lower limb, excluding foot: Secondary | ICD-10-CM | POA: Diagnosis not present

## 2011-12-31 DIAGNOSIS — M76899 Other specified enthesopathies of unspecified lower limb, excluding foot: Secondary | ICD-10-CM | POA: Diagnosis not present

## 2012-01-05 ENCOUNTER — Encounter: Payer: Self-pay | Admitting: Vascular Surgery

## 2012-01-06 ENCOUNTER — Encounter: Payer: Self-pay | Admitting: Vascular Surgery

## 2012-01-06 ENCOUNTER — Other Ambulatory Visit (INDEPENDENT_AMBULATORY_CARE_PROVIDER_SITE_OTHER): Payer: Medicare Other | Admitting: Vascular Surgery

## 2012-01-06 ENCOUNTER — Ambulatory Visit (INDEPENDENT_AMBULATORY_CARE_PROVIDER_SITE_OTHER): Payer: Medicare Other | Admitting: Vascular Surgery

## 2012-01-06 VITALS — BP 121/88 | HR 74 | Resp 16 | Ht 59.0 in | Wt 127.0 lb

## 2012-01-06 DIAGNOSIS — I83893 Varicose veins of bilateral lower extremities with other complications: Secondary | ICD-10-CM

## 2012-01-06 DIAGNOSIS — M79609 Pain in unspecified limb: Secondary | ICD-10-CM

## 2012-01-06 DIAGNOSIS — M76899 Other specified enthesopathies of unspecified lower limb, excluding foot: Secondary | ICD-10-CM | POA: Diagnosis not present

## 2012-01-06 NOTE — Progress Notes (Signed)
[facility]  Patient name: [name] MRN: [id number] DOB: May 22, 1928 Sex: female  REASON FOR CONSULT: varicose veins. Referred by Dr. Pete Glatter.  HPI: [name] is a 78 y.o. female with a long history of varicose veins. She describes cramping pain in her legs at night but has had no other significant symptoms related to her varicose veins. She denies burning pain significant aching pain or heaviness. She's had no claudication or rest pain. She does state that her legs get tired at times.  She had a friend who had laser ablation of her saphenous vein and had an excellent result and she wanted to consider this option if possible. She feels that the varicosities have been more prominent recently. She had tried compression stockings in the past and had a very difficult time putting them on because of her arthritis. Tablet she had stocking with a fairly significant gradient.  She does have a family history of varicose veins. Her mother had significant percussive disease.  Past Medical History  Diagnosis Date  . Hypertension   . Varicose veins   . Benign positional vertigo   . DJD (degenerative joint disease)   . Osteoporosis   . Bursitis started 10-2011    left leg and left hip    Family History  Problem Relation Age of Onset  . Alzheimer's disease Mother   . Coronary artery disease Father   . Ulcers Father     SOCIAL HISTORY: History  Substance Use Topics  . Smoking status: Never Smoker   . Smokeless tobacco: Not on file  . Alcohol Use: No    Allergies  Allergen Reactions  . Amoxicillin Diarrhea  . Ciprofloxacin   . Clindamycin/Lincomycin Diarrhea  . Codeine   . Lisinopril   . Penicillins   . Tetanus Toxoids   . Ultracet (Tramadol-Acetaminophen)     Current Outpatient Prescriptions  Medication Sig Dispense Refill  . alendronate (FOSAMAX) 70 MG tablet Take 70 mg by mouth every 7 (seven) days. Take with a full glass  of water on an empty stomach.       Marland Kitchen aspirin EC 81 MG tablet Take 81 mg by mouth daily.        . calcium gluconate 500 MG tablet Take 500 mg by mouth daily.        . Cholecalciferol (VITAMIN D) 2000 UNITS CAPS Take 1 capsule by mouth daily.        . Cranberry Extract 250 MG TABS Take 1 tablet by mouth daily.        . hydrochlorothiazide (HYDRODIURIL) 25 MG tablet Take 25 mg by mouth daily.      Marland Kitchen ibuprofen (ADVIL,MOTRIN) 200 MG tablet Take 200 mg by mouth every 6 (six) hours as needed.        . loratadine (CLARITIN) 10 MG tablet Take 10 mg by mouth daily.        . Lutein 6 MG CAPS Take 6 mg by mouth daily.        . Magnesium 300 MG CAPS Take 1 capsule by mouth daily.        . meclizine (ANTIVERT) 25 MG tablet Take 25 mg by mouth 2 (two) times daily as needed.        . vitamin B-12 (CYANOCOBALAMIN) 1000 MCG tablet Take 1,000 mcg by mouth daily.        . Bilberry, Vaccinium myrtillus, 60 MG CAPS Take by mouth.        Marland Kitchen  dextroamphetamine (DEXTROSTAT) 5 MG tablet Take 5 mg by mouth daily.        Marland Kitchen guaiFENesin (MUCINEX) 600 MG 12 hr tablet Take 1,200 mg by mouth 2 (two) times daily.          REVIEW OF SYSTEMS: Arly.Keller ] denotes positive finding; [  ] denotes negative finding CARDIOVASCULAR:  [ ]  chest pain   [ ]  chest pressure   [ ]  palpitations   [ ]  orthopnea   [ ]  dyspnea on exertion   [ ]  claudication   [ ]  rest pain   [ ]  DVT   [ ]  phlebitis PULMONARY:   [ ]  productive cough   [ ]  asthma   [ ]  wheezing NEUROLOGIC:   [ ]  weakness  [ ]  paresthesias  [ ]  aphasia  [ ]  amaurosis  Arly.Keller ] dizziness HEMATOLOGIC:   Arly.Keller ] bleeding problems (not related to her varicose veins)  [ ]  clotting disorders MUSCULOSKELETAL:  [ ]  joint pain   [ ]  joint swelling [ ]  leg swelling GASTROINTESTINAL: [ ]   blood in stool  [ ]   hematemesis GENITOURINARY:  [ ]   dysuria  [ ]   hematuria PSYCHIATRIC:  [ ]  history of major depression INTEGUMENTARY:  [ ]  rashes  [ ]  ulcers CONSTITUTIONAL:  [ ]  fever   [ ]  chills  PHYSICAL  EXAM: Filed Vitals:   01/06/12 1136  BP: 121/88  Pulse: 74  Resp: 16  Height: 4\' 11"  (1.499 m)  Weight: 127 lb (57.607 kg)   Body mass index is 25.65 kg/(m^2). GENERAL: The patient is a well-nourished female, in no acute distress. The vital signs are documented above. CARDIOVASCULAR: There is a regular rate and rhythm without significant murmur appreciated. I do not detect any carotid bruits. She has palpable femoral pulses and palpable dorsalis pedis pulses bilaterally. His mild bilateral lower extremity swelling. PULMONARY: There is good air exchange bilaterally without wheezing or rales. ABDOMEN: Soft and non-tender with normal pitched bowel sounds.  MUSCULOSKELETAL: There are no major deformities or cyanosis. NEUROLOGIC: No focal weakness or paresthesias are detected. SKIN: she has hyperpigmentation bilaterally. She has significant  Telangiectasias bilaterally. She has some superficial varicosities in the medial aspect of her left leg. PSYCHIATRIC: The patient has a normal affect.  DATA:  Have independently interpreted her venous duplex scan today. She has no evidence of DVT in either lower extremity. She has competent greater saphenous veins and lesser saphenous veins bilaterally. She does have some deep vein reflux in both common femoral veins. She has an incompetent perforator in the left mid medial calf.  Have reviewed her records from Dr. Ann Maki Stoneking's office. She has a history of hypertension which is been well controlled.  MEDICAL ISSUES: I've explained that given that her greater saphenous veins are competent she is not a candidate for laser ablated of her saphenous veins. Her venous insufficiency is related to valvular incompetence in the common femoral veins bilaterally. Therefore the treatment is simply elevation and compression therapy. I have instructed her on the importance of daily leg elevation and the proper positioning for this. I've also written her a prescription  for a compression stocking with a mild gradient (only 10-15 mmHg) as I think these stockings would help some and would be easier to get on and off. I'll be happy to see her back at any time if her symptoms progress.   Jaivon Vanbeek S [facility] Beeper: [phone]

## 2012-01-11 DIAGNOSIS — M76899 Other specified enthesopathies of unspecified lower limb, excluding foot: Secondary | ICD-10-CM | POA: Diagnosis not present

## 2012-01-13 NOTE — Procedures (Unsigned)
LOWER EXTREMITY VENOUS REFLUX EXAM  INDICATION:  Varicose veins, pain.  EXAM:  Using color-flow imaging and pulse Doppler spectral analysis, the bilateral common femoral, superficial femoral, popliteal, posterior tibial, greater and lesser saphenous veins are evaluated.  There is evidence suggesting deep venous insufficiency in the bilateral common femoral veins.  The bilateral saphenofemoral junctions are competent. The bilateral GSV's are competent.  The bilateral proximal short saphenous vein demonstrate competency.  GSV Diameter (used if found to be incompetent only)                                           Right    Left Proximal Greater Saphenous Vein           cm       cm Proximal-to-mid-thigh                     cm       cm Mid thigh                                 cm       cm Mid-distal thigh                          cm       cm Distal thigh                              cm       cm Knee                                      cm       cm  IMPRESSION: 1. The bilateral great saphenous veins are competent. 2. The bilateral great saphenous veins are not tortuous. 3. The deep venous system bilaterally including the common femoral     veins, is not competent with reflux of >500 milliseconds . 4. The bilateral small saphenous veins are competent. 5. There is a perforator vein involving the left medial calf, mid     segment, measuring 0.22 cm with reflux of >500 milliseconds.  ___________________________________________ Di Kindle. Edilia Bo, M.D.  SH/MEDQ  D:  01/06/2012  T:  01/06/2012  Job:  [id number]

## 2012-01-18 DIAGNOSIS — M76899 Other specified enthesopathies of unspecified lower limb, excluding foot: Secondary | ICD-10-CM | POA: Diagnosis not present

## 2012-01-25 DIAGNOSIS — M7150 Other bursitis, not elsewhere classified, unspecified site: Secondary | ICD-10-CM | POA: Diagnosis not present

## 2012-01-25 DIAGNOSIS — I10 Essential (primary) hypertension: Secondary | ICD-10-CM | POA: Diagnosis not present

## 2012-01-25 DIAGNOSIS — J309 Allergic rhinitis, unspecified: Secondary | ICD-10-CM | POA: Diagnosis not present

## 2012-01-25 DIAGNOSIS — Z79899 Other long term (current) drug therapy: Secondary | ICD-10-CM | POA: Diagnosis not present

## 2012-01-25 DIAGNOSIS — M810 Age-related osteoporosis without current pathological fracture: Secondary | ICD-10-CM | POA: Diagnosis not present

## 2012-01-27 DIAGNOSIS — M76899 Other specified enthesopathies of unspecified lower limb, excluding foot: Secondary | ICD-10-CM | POA: Diagnosis not present

## 2012-01-27 DIAGNOSIS — M4180 Other forms of scoliosis, site unspecified: Secondary | ICD-10-CM | POA: Diagnosis not present

## 2012-02-01 DIAGNOSIS — M25559 Pain in unspecified hip: Secondary | ICD-10-CM | POA: Diagnosis not present

## 2012-02-01 DIAGNOSIS — W010XXA Fall on same level from slipping, tripping and stumbling without subsequent striking against object, initial encounter: Secondary | ICD-10-CM | POA: Diagnosis not present

## 2012-02-01 DIAGNOSIS — M25579 Pain in unspecified ankle and joints of unspecified foot: Secondary | ICD-10-CM | POA: Diagnosis not present

## 2012-02-01 DIAGNOSIS — R51 Headache: Secondary | ICD-10-CM | POA: Diagnosis not present

## 2012-02-03 DIAGNOSIS — R269 Unspecified abnormalities of gait and mobility: Secondary | ICD-10-CM | POA: Diagnosis not present

## 2012-02-03 DIAGNOSIS — M4180 Other forms of scoliosis, site unspecified: Secondary | ICD-10-CM | POA: Diagnosis not present

## 2012-02-04 DIAGNOSIS — M76899 Other specified enthesopathies of unspecified lower limb, excluding foot: Secondary | ICD-10-CM | POA: Diagnosis not present

## 2012-02-04 DIAGNOSIS — R269 Unspecified abnormalities of gait and mobility: Secondary | ICD-10-CM | POA: Diagnosis not present

## 2012-02-08 DIAGNOSIS — R269 Unspecified abnormalities of gait and mobility: Secondary | ICD-10-CM | POA: Diagnosis not present

## 2012-02-10 DIAGNOSIS — R269 Unspecified abnormalities of gait and mobility: Secondary | ICD-10-CM | POA: Diagnosis not present

## 2012-02-15 DIAGNOSIS — R269 Unspecified abnormalities of gait and mobility: Secondary | ICD-10-CM | POA: Diagnosis not present

## 2012-02-17 DIAGNOSIS — R269 Unspecified abnormalities of gait and mobility: Secondary | ICD-10-CM | POA: Diagnosis not present

## 2012-02-23 DIAGNOSIS — R269 Unspecified abnormalities of gait and mobility: Secondary | ICD-10-CM | POA: Diagnosis not present

## 2012-02-25 DIAGNOSIS — M545 Low back pain: Secondary | ICD-10-CM | POA: Diagnosis not present

## 2012-02-25 DIAGNOSIS — M76899 Other specified enthesopathies of unspecified lower limb, excluding foot: Secondary | ICD-10-CM | POA: Diagnosis not present

## 2012-02-25 DIAGNOSIS — R269 Unspecified abnormalities of gait and mobility: Secondary | ICD-10-CM | POA: Diagnosis not present

## 2012-03-01 DIAGNOSIS — M810 Age-related osteoporosis without current pathological fracture: Secondary | ICD-10-CM | POA: Diagnosis not present

## 2012-03-02 DIAGNOSIS — M25559 Pain in unspecified hip: Secondary | ICD-10-CM | POA: Diagnosis not present

## 2012-03-14 DIAGNOSIS — M545 Low back pain: Secondary | ICD-10-CM | POA: Diagnosis not present

## 2012-03-14 DIAGNOSIS — M25559 Pain in unspecified hip: Secondary | ICD-10-CM | POA: Diagnosis not present

## 2012-03-16 DIAGNOSIS — R269 Unspecified abnormalities of gait and mobility: Secondary | ICD-10-CM | POA: Diagnosis not present

## 2012-03-17 DIAGNOSIS — R269 Unspecified abnormalities of gait and mobility: Secondary | ICD-10-CM | POA: Diagnosis not present

## 2012-03-21 DIAGNOSIS — R269 Unspecified abnormalities of gait and mobility: Secondary | ICD-10-CM | POA: Diagnosis not present

## 2012-03-22 DIAGNOSIS — Z79899 Other long term (current) drug therapy: Secondary | ICD-10-CM | POA: Diagnosis not present

## 2012-03-22 DIAGNOSIS — J301 Allergic rhinitis due to pollen: Secondary | ICD-10-CM | POA: Diagnosis not present

## 2012-03-22 DIAGNOSIS — M79609 Pain in unspecified limb: Secondary | ICD-10-CM | POA: Diagnosis not present

## 2012-03-22 DIAGNOSIS — I10 Essential (primary) hypertension: Secondary | ICD-10-CM | POA: Diagnosis not present

## 2012-03-22 DIAGNOSIS — M810 Age-related osteoporosis without current pathological fracture: Secondary | ICD-10-CM | POA: Diagnosis not present

## 2012-03-22 DIAGNOSIS — I872 Venous insufficiency (chronic) (peripheral): Secondary | ICD-10-CM | POA: Diagnosis not present

## 2012-03-23 DIAGNOSIS — R269 Unspecified abnormalities of gait and mobility: Secondary | ICD-10-CM | POA: Diagnosis not present

## 2012-03-30 DIAGNOSIS — J3489 Other specified disorders of nose and nasal sinuses: Secondary | ICD-10-CM | POA: Diagnosis not present

## 2012-03-30 DIAGNOSIS — J309 Allergic rhinitis, unspecified: Secondary | ICD-10-CM | POA: Diagnosis not present

## 2012-03-30 DIAGNOSIS — H04129 Dry eye syndrome of unspecified lacrimal gland: Secondary | ICD-10-CM | POA: Diagnosis not present

## 2012-04-01 DIAGNOSIS — R269 Unspecified abnormalities of gait and mobility: Secondary | ICD-10-CM | POA: Diagnosis not present

## 2012-04-05 DIAGNOSIS — R269 Unspecified abnormalities of gait and mobility: Secondary | ICD-10-CM | POA: Diagnosis not present

## 2012-04-07 DIAGNOSIS — R269 Unspecified abnormalities of gait and mobility: Secondary | ICD-10-CM | POA: Diagnosis not present

## 2012-04-12 DIAGNOSIS — M79609 Pain in unspecified limb: Secondary | ICD-10-CM | POA: Diagnosis not present

## 2012-04-13 ENCOUNTER — Other Ambulatory Visit: Payer: Self-pay | Admitting: Vascular Surgery

## 2012-04-28 DIAGNOSIS — M109 Gout, unspecified: Secondary | ICD-10-CM | POA: Diagnosis not present

## 2012-04-28 DIAGNOSIS — J3489 Other specified disorders of nose and nasal sinuses: Secondary | ICD-10-CM | POA: Diagnosis not present

## 2012-04-28 DIAGNOSIS — R490 Dysphonia: Secondary | ICD-10-CM | POA: Diagnosis not present

## 2012-04-28 DIAGNOSIS — J309 Allergic rhinitis, unspecified: Secondary | ICD-10-CM | POA: Diagnosis not present

## 2012-05-25 DIAGNOSIS — I872 Venous insufficiency (chronic) (peripheral): Secondary | ICD-10-CM | POA: Diagnosis not present

## 2012-05-25 DIAGNOSIS — M810 Age-related osteoporosis without current pathological fracture: Secondary | ICD-10-CM | POA: Diagnosis not present

## 2012-05-25 DIAGNOSIS — L299 Pruritus, unspecified: Secondary | ICD-10-CM | POA: Diagnosis not present

## 2012-05-25 DIAGNOSIS — I10 Essential (primary) hypertension: Secondary | ICD-10-CM | POA: Diagnosis not present

## 2012-06-23 DIAGNOSIS — R21 Rash and other nonspecific skin eruption: Secondary | ICD-10-CM | POA: Diagnosis not present

## 2012-09-28 DIAGNOSIS — Z23 Encounter for immunization: Secondary | ICD-10-CM | POA: Diagnosis not present

## 2012-09-28 DIAGNOSIS — Z0000 Encounter for general adult medical examination without abnormal findings: Secondary | ICD-10-CM | POA: Diagnosis not present

## 2012-09-28 DIAGNOSIS — I10 Essential (primary) hypertension: Secondary | ICD-10-CM | POA: Diagnosis not present

## 2012-09-28 DIAGNOSIS — Z1331 Encounter for screening for depression: Secondary | ICD-10-CM | POA: Diagnosis not present

## 2012-10-26 DIAGNOSIS — R05 Cough: Secondary | ICD-10-CM | POA: Diagnosis not present

## 2012-10-26 DIAGNOSIS — I10 Essential (primary) hypertension: Secondary | ICD-10-CM | POA: Diagnosis not present

## 2012-10-26 DIAGNOSIS — R079 Chest pain, unspecified: Secondary | ICD-10-CM | POA: Diagnosis not present

## 2013-04-26 DIAGNOSIS — I10 Essential (primary) hypertension: Secondary | ICD-10-CM | POA: Diagnosis not present

## 2013-04-26 DIAGNOSIS — D239 Other benign neoplasm of skin, unspecified: Secondary | ICD-10-CM | POA: Diagnosis not present

## 2013-04-26 DIAGNOSIS — M810 Age-related osteoporosis without current pathological fracture: Secondary | ICD-10-CM | POA: Diagnosis not present

## 2013-04-26 DIAGNOSIS — M1990 Unspecified osteoarthritis, unspecified site: Secondary | ICD-10-CM | POA: Diagnosis not present

## 2013-04-26 DIAGNOSIS — L659 Nonscarring hair loss, unspecified: Secondary | ICD-10-CM | POA: Diagnosis not present

## 2013-06-06 DIAGNOSIS — H40019 Open angle with borderline findings, low risk, unspecified eye: Secondary | ICD-10-CM | POA: Diagnosis not present

## 2013-06-06 DIAGNOSIS — H04129 Dry eye syndrome of unspecified lacrimal gland: Secondary | ICD-10-CM | POA: Diagnosis not present

## 2013-08-15 ENCOUNTER — Other Ambulatory Visit: Payer: Self-pay | Admitting: Dermatology

## 2013-08-15 DIAGNOSIS — L820 Inflamed seborrheic keratosis: Secondary | ICD-10-CM | POA: Diagnosis not present

## 2013-08-15 DIAGNOSIS — C44519 Basal cell carcinoma of skin of other part of trunk: Secondary | ICD-10-CM | POA: Diagnosis not present

## 2013-08-15 DIAGNOSIS — C44611 Basal cell carcinoma of skin of unspecified upper limb, including shoulder: Secondary | ICD-10-CM | POA: Diagnosis not present

## 2013-09-18 DIAGNOSIS — H40019 Open angle with borderline findings, low risk, unspecified eye: Secondary | ICD-10-CM | POA: Diagnosis not present

## 2013-10-04 ENCOUNTER — Ambulatory Visit
Admission: RE | Admit: 2013-10-04 | Discharge: 2013-10-04 | Disposition: A | Discharge status: Home / Self Care | Payer: Medicare Other | Source: Ambulatory Visit | Attending: Geriatric Medicine | Admitting: Geriatric Medicine

## 2013-10-04 ENCOUNTER — Other Ambulatory Visit: Payer: Self-pay | Admitting: Geriatric Medicine

## 2013-10-04 DIAGNOSIS — Z0000 Encounter for general adult medical examination without abnormal findings: Secondary | ICD-10-CM | POA: Diagnosis not present

## 2013-10-04 DIAGNOSIS — L659 Nonscarring hair loss, unspecified: Secondary | ICD-10-CM | POA: Diagnosis not present

## 2013-10-04 DIAGNOSIS — I872 Venous insufficiency (chronic) (peripheral): Secondary | ICD-10-CM | POA: Diagnosis not present

## 2013-10-04 DIAGNOSIS — D649 Anemia, unspecified: Secondary | ICD-10-CM | POA: Diagnosis not present

## 2013-10-04 DIAGNOSIS — M549 Dorsalgia, unspecified: Secondary | ICD-10-CM | POA: Diagnosis not present

## 2013-10-04 DIAGNOSIS — M4040 Postural lordosis, site unspecified: Secondary | ICD-10-CM | POA: Diagnosis not present

## 2013-10-04 DIAGNOSIS — Z79899 Other long term (current) drug therapy: Secondary | ICD-10-CM | POA: Diagnosis not present

## 2013-10-04 DIAGNOSIS — R252 Cramp and spasm: Secondary | ICD-10-CM | POA: Diagnosis not present

## 2013-10-04 DIAGNOSIS — M779 Enthesopathy, unspecified: Secondary | ICD-10-CM | POA: Diagnosis not present

## 2013-10-04 DIAGNOSIS — Z1331 Encounter for screening for depression: Secondary | ICD-10-CM | POA: Diagnosis not present

## 2013-12-26 DIAGNOSIS — Z23 Encounter for immunization: Secondary | ICD-10-CM | POA: Diagnosis not present

## 2014-01-17 DIAGNOSIS — Z85828 Personal history of other malignant neoplasm of skin: Secondary | ICD-10-CM | POA: Diagnosis not present

## 2014-01-22 DIAGNOSIS — H40019 Open angle with borderline findings, low risk, unspecified eye: Secondary | ICD-10-CM | POA: Diagnosis not present

## 2014-02-02 DIAGNOSIS — J019 Acute sinusitis, unspecified: Secondary | ICD-10-CM | POA: Diagnosis not present

## 2014-04-06 DIAGNOSIS — I10 Essential (primary) hypertension: Secondary | ICD-10-CM | POA: Diagnosis not present

## 2014-04-06 DIAGNOSIS — M545 Low back pain, unspecified: Secondary | ICD-10-CM | POA: Diagnosis not present

## 2014-04-06 DIAGNOSIS — Z79899 Other long term (current) drug therapy: Secondary | ICD-10-CM | POA: Diagnosis not present

## 2014-05-29 DIAGNOSIS — H40019 Open angle with borderline findings, low risk, unspecified eye: Secondary | ICD-10-CM | POA: Diagnosis not present

## 2014-05-29 DIAGNOSIS — H35039 Hypertensive retinopathy, unspecified eye: Secondary | ICD-10-CM | POA: Diagnosis not present

## 2014-05-29 DIAGNOSIS — H18509 Unspecified hereditary corneal dystrophies, unspecified eye: Secondary | ICD-10-CM | POA: Diagnosis not present

## 2014-09-17 DIAGNOSIS — H40019 Open angle with borderline findings, low risk, unspecified eye: Secondary | ICD-10-CM | POA: Diagnosis not present

## 2014-10-10 DIAGNOSIS — I872 Venous insufficiency (chronic) (peripheral): Secondary | ICD-10-CM | POA: Diagnosis not present

## 2014-10-10 DIAGNOSIS — M810 Age-related osteoporosis without current pathological fracture: Secondary | ICD-10-CM | POA: Diagnosis not present

## 2014-10-10 DIAGNOSIS — Z79899 Other long term (current) drug therapy: Secondary | ICD-10-CM | POA: Diagnosis not present

## 2014-10-10 DIAGNOSIS — I10 Essential (primary) hypertension: Secondary | ICD-10-CM | POA: Diagnosis not present

## 2014-10-10 DIAGNOSIS — R159 Full incontinence of feces: Secondary | ICD-10-CM | POA: Diagnosis not present

## 2014-10-10 DIAGNOSIS — Z1389 Encounter for screening for other disorder: Secondary | ICD-10-CM | POA: Diagnosis not present

## 2014-10-10 DIAGNOSIS — Z23 Encounter for immunization: Secondary | ICD-10-CM | POA: Diagnosis not present

## 2014-10-10 DIAGNOSIS — Z0000 Encounter for general adult medical examination without abnormal findings: Secondary | ICD-10-CM | POA: Diagnosis not present

## 2014-10-24 DIAGNOSIS — M810 Age-related osteoporosis without current pathological fracture: Secondary | ICD-10-CM | POA: Diagnosis not present

## 2014-11-05 DIAGNOSIS — M810 Age-related osteoporosis without current pathological fracture: Secondary | ICD-10-CM | POA: Diagnosis not present

## 2015-02-13 DIAGNOSIS — I10 Essential (primary) hypertension: Secondary | ICD-10-CM | POA: Diagnosis not present

## 2015-02-13 DIAGNOSIS — R252 Cramp and spasm: Secondary | ICD-10-CM | POA: Diagnosis not present

## 2015-02-13 DIAGNOSIS — M542 Cervicalgia: Secondary | ICD-10-CM | POA: Diagnosis not present

## 2015-02-13 DIAGNOSIS — Z79899 Other long term (current) drug therapy: Secondary | ICD-10-CM | POA: Diagnosis not present

## 2015-02-14 DIAGNOSIS — R040 Epistaxis: Secondary | ICD-10-CM | POA: Diagnosis not present

## 2015-02-27 DIAGNOSIS — R040 Epistaxis: Secondary | ICD-10-CM | POA: Diagnosis not present

## 2015-02-27 DIAGNOSIS — J300 Vasomotor rhinitis: Secondary | ICD-10-CM | POA: Diagnosis not present

## 2015-04-15 DIAGNOSIS — H35033 Hypertensive retinopathy, bilateral: Secondary | ICD-10-CM | POA: Diagnosis not present

## 2015-04-15 DIAGNOSIS — H40023 Open angle with borderline findings, high risk, bilateral: Secondary | ICD-10-CM | POA: Diagnosis not present

## 2015-04-15 DIAGNOSIS — H401230 Low-tension glaucoma, bilateral, stage unspecified: Secondary | ICD-10-CM | POA: Diagnosis not present

## 2015-06-03 DIAGNOSIS — H40023 Open angle with borderline findings, high risk, bilateral: Secondary | ICD-10-CM | POA: Diagnosis not present

## 2015-06-03 DIAGNOSIS — H04123 Dry eye syndrome of bilateral lacrimal glands: Secondary | ICD-10-CM | POA: Diagnosis not present

## 2015-06-03 DIAGNOSIS — H35033 Hypertensive retinopathy, bilateral: Secondary | ICD-10-CM | POA: Diagnosis not present

## 2015-06-07 ENCOUNTER — Encounter (HOSPITAL_COMMUNITY): Payer: Self-pay | Admitting: Emergency Medicine

## 2015-06-07 ENCOUNTER — Emergency Department (INDEPENDENT_AMBULATORY_CARE_PROVIDER_SITE_OTHER)
Admission: EM | Admit: 2015-06-07 | Discharge: 2015-06-07 | Disposition: A | Discharge status: Home / Self Care | Payer: Medicare Other | Source: Home / Self Care | Attending: Emergency Medicine | Admitting: Emergency Medicine

## 2015-06-07 DIAGNOSIS — S30860A Insect bite (nonvenomous) of lower back and pelvis, initial encounter: Secondary | ICD-10-CM

## 2015-06-07 DIAGNOSIS — W57XXXA Bitten or stung by nonvenomous insect and other nonvenomous arthropods, initial encounter: Secondary | ICD-10-CM

## 2015-06-07 MED ORDER — MUPIROCIN CALCIUM 2 % EX CREA: 1.0000 "application " | TOPICAL_CREAM | Freq: Two times a day (BID) | CUTANEOUS | Status: DC

## 2015-06-07 NOTE — ED Notes (Signed)
Patient c/o tick bite on her upper back x 2 days ago. She states her family helped her remove the tick and she is not sure if it all came out. She had a neighbor put [employer] and aloe vera on it. Site has redness and itching. Patient is in NAD.

## 2015-06-07 NOTE — ED Provider Notes (Signed)
CSN: [id number]     Arrival date & time 06/07/15  77 History   First MD Initiated Contact with Patient 06/07/15 1327     Chief Complaint  Patient presents with  . Tick Removal   (Consider location/radiation/quality/duration/timing/severity/associated sxs/prior Treatment) HPI  She is an 81 year old woman here for evaluation of tick bite. She states she noticed the take 2 days ago. Her grandson removed it. It is located on her upper back at the level of the bra strap. She states the site is red and itchy. She denies any drainage. No fevers or rash. She has a history of severe C. difficile.  Past Medical History  Diagnosis Date  . Hypertension   . Varicose veins   . Benign positional vertigo   . DJD (degenerative joint disease)   . Osteoporosis   . Bursitis started 10-2011    left leg and left hip   Past Surgical History  Procedure Laterality Date  . Abdominal hysterectomy    . Hernia repair    . Tubal ligation      bilateral  . Appendectomy    . Cataract extraction      right  . Kyphosis surgery     Family History  Problem Relation Age of Onset  . Alzheimer's disease Mother   . Coronary artery disease Father   . Ulcers Father    History  Substance Use Topics  . Smoking status: Never Smoker   . Smokeless tobacco: Not on file  . Alcohol Use: No   OB History    No data available     Review of Systems As in history of present illness Allergies  Amoxicillin; Ciprofloxacin; Clindamycin/lincomycin; Codeine; Lisinopril; Penicillins; Tetanus toxoids; and Ultracet  Home Medications   Prior to Admission medications   Medication Sig Start Date End Date Taking? Authorizing Provider  alendronate (FOSAMAX) 70 MG tablet Take 70 mg by mouth every 7 (seven) days. Take with a full glass of water on an empty stomach.     Historical Provider, MD  aspirin EC 81 MG tablet Take 81 mg by mouth daily.      Historical Provider, MD  Bilberry, Vaccinium myrtillus, 60 MG CAPS Take by  mouth.      Historical Provider, MD  calcium gluconate 500 MG tablet Take 500 mg by mouth daily.      Historical Provider, MD  Cholecalciferol (VITAMIN D) 2000 UNITS CAPS Take 1 capsule by mouth daily.      Historical Provider, MD  Cranberry Extract 250 MG TABS Take 1 tablet by mouth daily.      Historical Provider, MD  dextroamphetamine (DEXTROSTAT) 5 MG tablet Take 5 mg by mouth daily.      Historical Provider, MD  guaiFENesin (MUCINEX) 600 MG 12 hr tablet Take 1,200 mg by mouth 2 (two) times daily.      Historical Provider, MD  hydrochlorothiazide (HYDRODIURIL) 25 MG tablet Take 25 mg by mouth daily.    Historical Provider, MD  ibuprofen (ADVIL,MOTRIN) 200 MG tablet Take 200 mg by mouth every 6 (six) hours as needed.      Historical Provider, MD  loratadine (CLARITIN) 10 MG tablet Take 10 mg by mouth daily.      Historical Provider, MD  Lutein 6 MG CAPS Take 6 mg by mouth daily.      Historical Provider, MD  Magnesium 300 MG CAPS Take 1 capsule by mouth daily.      Historical Provider, MD  meclizine (ANTIVERT) 25 MG tablet  Take 25 mg by mouth 2 (two) times daily as needed.      Historical Provider, MD  mupirocin cream (BACTROBAN) 2 % Apply 1 application topically 2 (two) times daily. 06/07/15   Melony Overly, MD  vitamin B-12 (CYANOCOBALAMIN) 1000 MCG tablet Take 1,000 mcg by mouth daily.      Historical Provider, MD   BP 151/86 mmHg  Pulse 99  Temp(Src) 97.8 F (36.6 C) (Oral)  Resp 12  SpO2 98% Physical Exam  Constitutional: She is oriented to person, place, and time. She appears well-developed and well-nourished. No distress.  Cardiovascular: Normal rate.   Pulmonary/Chest: Effort normal.  Neurological: She is alert and oriented to person, place, and time.  Skin:  1.5 cm area of redness in her mid back at the level of the bra strap. There are no retained tick products.    ED Course  Procedures (including critical care time) Labs Review Labs Reviewed - No data to  display  Imaging Review No results found.   MDM   1. Tick bite of back, initial encounter    The site of the tick bite is irritated, likely from the bra strap. No signs of infection. No fever or erythema migrans at this time to suggest need for antibiotics. Especially given her history of C. difficile, we will avoid antibiotics. Recommended using mupirocin cream twice a day. Reviewed return precautions including a spreading red rash or fever.    Melony Overly, MD 06/07/15 [phone]

## 2015-06-07 NOTE — Discharge Instructions (Signed)
There is no retained tick. At this time, I do not see signs of infection. The site is irritated from your bra. Apply mupirocin cream twice a day. If you develop fevers or you see a spreading red rash, please follow-up here or with your doctor.

## 2015-06-12 ENCOUNTER — Other Ambulatory Visit: Payer: Self-pay | Admitting: Geriatric Medicine

## 2015-06-12 DIAGNOSIS — I10 Essential (primary) hypertension: Secondary | ICD-10-CM | POA: Diagnosis not present

## 2015-06-12 DIAGNOSIS — R1084 Generalized abdominal pain: Secondary | ICD-10-CM

## 2015-06-12 DIAGNOSIS — Z79899 Other long term (current) drug therapy: Secondary | ICD-10-CM | POA: Diagnosis not present

## 2015-06-12 DIAGNOSIS — S20369A Insect bite (nonvenomous) of unspecified front wall of thorax, initial encounter: Secondary | ICD-10-CM | POA: Diagnosis not present

## 2015-06-12 DIAGNOSIS — Z1389 Encounter for screening for other disorder: Secondary | ICD-10-CM | POA: Diagnosis not present

## 2015-06-12 DIAGNOSIS — W57XXXA Bitten or stung by nonvenomous insect and other nonvenomous arthropods, initial encounter: Secondary | ICD-10-CM | POA: Diagnosis not present

## 2015-06-12 DIAGNOSIS — I872 Venous insufficiency (chronic) (peripheral): Secondary | ICD-10-CM | POA: Diagnosis not present

## 2015-06-19 ENCOUNTER — Ambulatory Visit
Admission: RE | Admit: 2015-06-19 | Discharge: 2015-06-19 | Disposition: A | Discharge status: Home / Self Care | Payer: Medicare Other | Source: Ambulatory Visit | Attending: Geriatric Medicine | Admitting: Geriatric Medicine

## 2015-06-19 DIAGNOSIS — R1084 Generalized abdominal pain: Secondary | ICD-10-CM | POA: Diagnosis not present

## 2015-06-19 DIAGNOSIS — K5730 Diverticulosis of large intestine without perforation or abscess without bleeding: Secondary | ICD-10-CM | POA: Diagnosis not present

## 2015-06-19 MED ORDER — IOPAMIDOL (ISOVUE-300) INJECTION 61%
100.0000 mL | Freq: Once | INTRAVENOUS | Status: AC | PRN
  Administered 2015-06-19: 100 mL via INTRAVENOUS

## 2015-07-29 DIAGNOSIS — R197 Diarrhea, unspecified: Secondary | ICD-10-CM | POA: Diagnosis not present

## 2015-08-26 DIAGNOSIS — H35033 Hypertensive retinopathy, bilateral: Secondary | ICD-10-CM | POA: Diagnosis not present

## 2015-08-26 DIAGNOSIS — H40023 Open angle with borderline findings, high risk, bilateral: Secondary | ICD-10-CM | POA: Diagnosis not present

## 2015-08-26 DIAGNOSIS — H04123 Dry eye syndrome of bilateral lacrimal glands: Secondary | ICD-10-CM | POA: Diagnosis not present

## 2015-12-03 DIAGNOSIS — I872 Venous insufficiency (chronic) (peripheral): Secondary | ICD-10-CM | POA: Diagnosis not present

## 2015-12-03 DIAGNOSIS — Z23 Encounter for immunization: Secondary | ICD-10-CM | POA: Diagnosis not present

## 2015-12-03 DIAGNOSIS — Z1389 Encounter for screening for other disorder: Secondary | ICD-10-CM | POA: Diagnosis not present

## 2015-12-03 DIAGNOSIS — M810 Age-related osteoporosis without current pathological fracture: Secondary | ICD-10-CM | POA: Diagnosis not present

## 2015-12-03 DIAGNOSIS — Z0000 Encounter for general adult medical examination without abnormal findings: Secondary | ICD-10-CM | POA: Diagnosis not present

## 2015-12-03 DIAGNOSIS — Z79899 Other long term (current) drug therapy: Secondary | ICD-10-CM | POA: Diagnosis not present

## 2015-12-03 DIAGNOSIS — I10 Essential (primary) hypertension: Secondary | ICD-10-CM | POA: Diagnosis not present

## 2015-12-03 DIAGNOSIS — R252 Cramp and spasm: Secondary | ICD-10-CM | POA: Diagnosis not present

## 2016-02-12 DIAGNOSIS — B029 Zoster without complications: Secondary | ICD-10-CM | POA: Diagnosis not present

## 2016-05-05 DIAGNOSIS — H04123 Dry eye syndrome of bilateral lacrimal glands: Secondary | ICD-10-CM | POA: Diagnosis not present

## 2016-05-05 DIAGNOSIS — H40023 Open angle with borderline findings, high risk, bilateral: Secondary | ICD-10-CM | POA: Diagnosis not present

## 2016-05-05 DIAGNOSIS — H35033 Hypertensive retinopathy, bilateral: Secondary | ICD-10-CM | POA: Diagnosis not present

## 2016-05-08 DIAGNOSIS — W57XXXA Bitten or stung by nonvenomous insect and other nonvenomous arthropods, initial encounter: Secondary | ICD-10-CM | POA: Diagnosis not present

## 2016-05-08 DIAGNOSIS — S30860A Insect bite (nonvenomous) of lower back and pelvis, initial encounter: Secondary | ICD-10-CM | POA: Diagnosis not present

## 2016-05-08 DIAGNOSIS — R21 Rash and other nonspecific skin eruption: Secondary | ICD-10-CM | POA: Diagnosis not present

## 2016-06-03 DIAGNOSIS — I10 Essential (primary) hypertension: Secondary | ICD-10-CM | POA: Diagnosis not present

## 2016-06-03 DIAGNOSIS — W57XXXD Bitten or stung by nonvenomous insect and other nonvenomous arthropods, subsequent encounter: Secondary | ICD-10-CM | POA: Diagnosis not present

## 2016-06-03 DIAGNOSIS — S30860D Insect bite (nonvenomous) of lower back and pelvis, subsequent encounter: Secondary | ICD-10-CM | POA: Diagnosis not present

## 2016-06-03 DIAGNOSIS — Z79899 Other long term (current) drug therapy: Secondary | ICD-10-CM | POA: Diagnosis not present

## 2016-06-03 DIAGNOSIS — M4120 Other idiopathic scoliosis, site unspecified: Secondary | ICD-10-CM | POA: Diagnosis not present

## 2016-06-03 DIAGNOSIS — M25561 Pain in right knee: Secondary | ICD-10-CM | POA: Diagnosis not present

## 2016-06-09 DIAGNOSIS — W57XXXA Bitten or stung by nonvenomous insect and other nonvenomous arthropods, initial encounter: Secondary | ICD-10-CM | POA: Diagnosis not present

## 2016-06-09 DIAGNOSIS — S20161A Insect bite (nonvenomous) of breast, right breast, initial encounter: Secondary | ICD-10-CM | POA: Diagnosis not present

## 2016-08-03 DIAGNOSIS — H524 Presbyopia: Secondary | ICD-10-CM | POA: Diagnosis not present

## 2016-08-03 DIAGNOSIS — H40023 Open angle with borderline findings, high risk, bilateral: Secondary | ICD-10-CM | POA: Diagnosis not present

## 2016-08-03 DIAGNOSIS — H35033 Hypertensive retinopathy, bilateral: Secondary | ICD-10-CM | POA: Diagnosis not present

## 2016-08-03 DIAGNOSIS — H04123 Dry eye syndrome of bilateral lacrimal glands: Secondary | ICD-10-CM | POA: Diagnosis not present

## 2016-08-03 DIAGNOSIS — H52223 Regular astigmatism, bilateral: Secondary | ICD-10-CM | POA: Diagnosis not present

## 2016-08-10 DIAGNOSIS — R1031 Right lower quadrant pain: Secondary | ICD-10-CM | POA: Diagnosis not present

## 2016-08-10 DIAGNOSIS — K469 Unspecified abdominal hernia without obstruction or gangrene: Secondary | ICD-10-CM | POA: Diagnosis not present

## 2016-08-24 ENCOUNTER — Ambulatory Visit
Admission: RE | Admit: 2016-08-24 | Discharge: 2016-08-24 | Disposition: A | Discharge status: Home / Self Care | Payer: Medicare Other | Source: Ambulatory Visit | Attending: Geriatric Medicine | Admitting: Geriatric Medicine

## 2016-08-24 ENCOUNTER — Other Ambulatory Visit: Payer: Self-pay | Admitting: Geriatric Medicine

## 2016-08-24 DIAGNOSIS — M25551 Pain in right hip: Secondary | ICD-10-CM

## 2016-08-24 DIAGNOSIS — M1611 Unilateral primary osteoarthritis, right hip: Secondary | ICD-10-CM | POA: Diagnosis not present

## 2016-08-24 DIAGNOSIS — R21 Rash and other nonspecific skin eruption: Secondary | ICD-10-CM | POA: Diagnosis not present

## 2016-08-24 DIAGNOSIS — G47419 Narcolepsy without cataplexy: Secondary | ICD-10-CM | POA: Diagnosis not present

## 2016-08-24 DIAGNOSIS — I10 Essential (primary) hypertension: Secondary | ICD-10-CM | POA: Diagnosis not present

## 2016-08-24 DIAGNOSIS — K432 Incisional hernia without obstruction or gangrene: Secondary | ICD-10-CM | POA: Diagnosis not present

## 2016-09-16 ENCOUNTER — Emergency Department (HOSPITAL_COMMUNITY): Payer: Medicare Other

## 2016-09-16 ENCOUNTER — Inpatient Hospital Stay (HOSPITAL_COMMUNITY)
Admission: EM | Admit: 2016-09-16 | Discharge: 2016-09-18 | DRG: 066 | Disposition: A | Discharge status: Home Health | Payer: Medicare Other | Attending: Internal Medicine | Admitting: Internal Medicine

## 2016-09-16 ENCOUNTER — Other Ambulatory Visit (HOSPITAL_COMMUNITY): Payer: Medicare Other

## 2016-09-16 ENCOUNTER — Encounter (HOSPITAL_COMMUNITY): Payer: Self-pay | Admitting: Internal Medicine

## 2016-09-16 DIAGNOSIS — Z79899 Other long term (current) drug therapy: Secondary | ICD-10-CM

## 2016-09-16 DIAGNOSIS — R29810 Facial weakness: Secondary | ICD-10-CM | POA: Diagnosis present

## 2016-09-16 DIAGNOSIS — H8110 Benign paroxysmal vertigo, unspecified ear: Secondary | ICD-10-CM | POA: Diagnosis not present

## 2016-09-16 DIAGNOSIS — Z880 Allergy status to penicillin: Secondary | ICD-10-CM

## 2016-09-16 DIAGNOSIS — M810 Age-related osteoporosis without current pathological fracture: Secondary | ICD-10-CM | POA: Diagnosis present

## 2016-09-16 DIAGNOSIS — R000 Tachycardia, unspecified: Secondary | ICD-10-CM | POA: Diagnosis present

## 2016-09-16 DIAGNOSIS — I639 Cerebral infarction, unspecified: Secondary | ICD-10-CM | POA: Insufficient documentation

## 2016-09-16 DIAGNOSIS — I10 Essential (primary) hypertension: Secondary | ICD-10-CM | POA: Diagnosis present

## 2016-09-16 DIAGNOSIS — I6330 Cerebral infarction due to thrombosis of unspecified cerebral artery: Principal | ICD-10-CM | POA: Insufficient documentation

## 2016-09-16 DIAGNOSIS — R269 Unspecified abnormalities of gait and mobility: Secondary | ICD-10-CM

## 2016-09-16 DIAGNOSIS — Z888 Allergy status to other drugs, medicaments and biological substances status: Secondary | ICD-10-CM

## 2016-09-16 DIAGNOSIS — R195 Other fecal abnormalities: Secondary | ICD-10-CM | POA: Diagnosis present

## 2016-09-16 DIAGNOSIS — Z885 Allergy status to narcotic agent status: Secondary | ICD-10-CM

## 2016-09-16 DIAGNOSIS — Z90710 Acquired absence of both cervix and uterus: Secondary | ICD-10-CM

## 2016-09-16 DIAGNOSIS — Z887 Allergy status to serum and vaccine status: Secondary | ICD-10-CM

## 2016-09-16 DIAGNOSIS — Z7982 Long term (current) use of aspirin: Secondary | ICD-10-CM

## 2016-09-16 DIAGNOSIS — R4781 Slurred speech: Secondary | ICD-10-CM | POA: Diagnosis not present

## 2016-09-16 DIAGNOSIS — E785 Hyperlipidemia, unspecified: Secondary | ICD-10-CM | POA: Diagnosis present

## 2016-09-16 DIAGNOSIS — R29702 NIHSS score 2: Secondary | ICD-10-CM | POA: Diagnosis present

## 2016-09-16 LAB — COMPREHENSIVE METABOLIC PANEL
ALBUMIN: 4.1 g/dL (ref 3.5–5.0)
ALK PHOS: 51 U/L (ref 38–126)
ALT: 16 U/L (ref 14–54)
ANION GAP: 6 (ref 5–15)
AST: 22 U/L (ref 15–41)
BILIRUBIN TOTAL: 0.4 mg/dL (ref 0.3–1.2)
BUN: 17 mg/dL (ref 6–20)
CALCIUM: 9.6 mg/dL (ref 8.9–10.3)
CO2: 27 mmol/L (ref 22–32)
CREATININE: 0.91 mg/dL (ref 0.44–1.00)
Chloride: 104 mmol/L (ref 101–111)
GFR calc Af Amer: >60 mL/min (ref >60)
GFR calc non Af Amer: 56 mL/min — ABNORMAL LOW (ref >60)
GLUCOSE: 119 mg/dL — AB (ref 65–99)
Potassium: 4.2 mmol/L (ref 3.5–5.1)
SODIUM: 137 mmol/L (ref 135–145)
TOTAL PROTEIN: 7.2 g/dL (ref 6.5–8.1)

## 2016-09-16 LAB — URINE MICROSCOPIC-ADD ON: RBC / HPF: NONE SEEN RBC/hpf (ref 0–5)

## 2016-09-16 LAB — I-STAT CHEM 8, ED
BUN: 17 mg/dL (ref 6–20)
CALCIUM ION: 1.19 mmol/L (ref 1.15–1.40)
CHLORIDE: 101 mmol/L (ref 101–111)
CREATININE: 0.9 mg/dL (ref 0.44–1.00)
GLUCOSE: 120 mg/dL — AB (ref 65–99)
HCT: 38 % (ref 36.0–46.0)
Hemoglobin: 12.9 g/dL (ref 12.0–15.0)
POTASSIUM: 4.2 mmol/L (ref 3.5–5.1)
Sodium: 139 mmol/L (ref 135–145)
TCO2: 26 mmol/L (ref 0–100)

## 2016-09-16 LAB — DIFFERENTIAL
BASOS ABS: 0.1 10*3/uL (ref 0.0–0.1)
Basophils Relative: 1 %
EOS PCT: 2 %
Eosinophils Absolute: 0.2 10*3/uL (ref 0.0–0.7)
LYMPHS ABS: 1.4 10*3/uL (ref 0.7–4.0)
LYMPHS PCT: 20 %
Monocytes Absolute: 1 10*3/uL (ref 0.1–1.0)
Monocytes Relative: 15 %
NEUTROS ABS: 4.3 10*3/uL (ref 1.7–7.7)
NEUTROS PCT: 62 %

## 2016-09-16 LAB — URINALYSIS, ROUTINE W REFLEX MICROSCOPIC
Bilirubin Urine: NEGATIVE
GLUCOSE, UA: NEGATIVE mg/dL
HGB URINE DIPSTICK: NEGATIVE
Ketones, ur: NEGATIVE mg/dL
Nitrite: NEGATIVE
PROTEIN: NEGATIVE mg/dL
SPECIFIC GRAVITY, URINE: 1.007 (ref 1.005–1.030)
pH: 8 (ref 5.0–8.0)

## 2016-09-16 LAB — CBC
HCT: 37.6 % (ref 36.0–46.0)
HEMOGLOBIN: 12.1 g/dL (ref 12.0–15.0)
MCH: 28.3 pg (ref 26.0–34.0)
MCHC: 32.2 g/dL (ref 30.0–36.0)
MCV: 87.9 fL (ref 78.0–100.0)
PLATELETS: 310 10*3/uL (ref 150–400)
RBC: 4.28 MIL/uL (ref 3.87–5.11)
RDW: 14.6 % (ref 11.5–15.5)
WBC: 7 10*3/uL (ref 4.0–10.5)

## 2016-09-16 LAB — PROTIME-INR
INR: 1.02
PROTHROMBIN TIME: 13.4 s (ref 11.4–15.2)

## 2016-09-16 LAB — APTT: APTT: 32 s (ref 24–36)

## 2016-09-16 LAB — CBG MONITORING, ED: GLUCOSE-CAPILLARY: 118 mg/dL — AB (ref 65–99)

## 2016-09-16 LAB — I-STAT TROPONIN, ED: Troponin i, poc: 0 ng/mL (ref 0.00–0.08)

## 2016-09-16 MED ORDER — MECLIZINE HCL 25 MG PO TABS
25.0000 mg | ORAL_TABLET | Freq: Two times a day (BID) | ORAL | Status: DC | PRN
  Filled 2016-09-16: qty 1

## 2016-09-16 MED ORDER — FLUTICASONE PROPIONATE 50 MCG/ACT NA SUSP
2.0000 | Freq: Every day | NASAL | Status: DC
  Administered 2016-09-17 – 2016-09-18 (×2): 2 via NASAL
  Filled 2016-09-16: qty 16

## 2016-09-16 MED ORDER — OXYCODONE-ACETAMINOPHEN 5-325 MG PO TABS: 0.5000 | ORAL_TABLET | ORAL | Status: DC | PRN

## 2016-09-16 MED ORDER — SENNOSIDES-DOCUSATE SODIUM 8.6-50 MG PO TABS: 1.0000 | ORAL_TABLET | Freq: Every evening | ORAL | Status: DC | PRN

## 2016-09-16 MED ORDER — DEXTROAMPHETAMINE SULFATE ER 5 MG PO CP24
5.0000 mg | ORAL_CAPSULE | Freq: Every day | ORAL | Status: DC
  Administered 2016-09-17 – 2016-09-18 (×2): 5 mg via ORAL
  Filled 2016-09-16 (×2): qty 1

## 2016-09-16 MED ORDER — LORATADINE 10 MG PO TABS: 10.0000 mg | ORAL_TABLET | Freq: Every day | ORAL | Status: DC | PRN

## 2016-09-16 MED ORDER — HYDRALAZINE HCL 20 MG/ML IJ SOLN: 5.0000 mg | INTRAMUSCULAR | Status: DC | PRN

## 2016-09-16 MED ORDER — ASPIRIN 300 MG RE SUPP: 300.0000 mg | Freq: Every day | RECTAL | Status: DC

## 2016-09-16 MED ORDER — CALCIUM CARBONATE 1250 (500 CA) MG PO TABS
1250.0000 mg | ORAL_TABLET | Freq: Every day | ORAL | Status: DC
  Administered 2016-09-17 – 2016-09-18 (×2): 1250 mg via ORAL
  Filled 2016-09-16 (×2): qty 1

## 2016-09-16 MED ORDER — ONDANSETRON HCL 4 MG/2ML IJ SOLN: 4.0000 mg | Freq: Three times a day (TID) | INTRAMUSCULAR | Status: DC | PRN

## 2016-09-16 MED ORDER — STROKE: EARLY STAGES OF RECOVERY BOOK
Freq: Once | Status: AC
  Administered 2016-09-17
  Filled 2016-09-16: qty 1

## 2016-09-16 MED ORDER — ENOXAPARIN SODIUM 40 MG/0.4ML ~~LOC~~ SOLN
40.0000 mg | Freq: Every day | SUBCUTANEOUS | Status: DC
  Administered 2016-09-17 (×2): 40 mg via SUBCUTANEOUS
  Filled 2016-09-16 (×2): qty 0.4

## 2016-09-16 MED ORDER — IBUPROFEN 200 MG PO TABS: 200.0000 mg | ORAL_TABLET | Freq: Four times a day (QID) | ORAL | Status: DC | PRN

## 2016-09-16 MED ORDER — LISINOPRIL 10 MG PO TABS
10.0000 mg | ORAL_TABLET | Freq: Every day | ORAL | Status: DC
  Administered 2016-09-17: 10 mg via ORAL
  Filled 2016-09-16: qty 1

## 2016-09-16 MED ORDER — SODIUM CHLORIDE 0.9 % IV SOLN
INTRAVENOUS | Status: DC
  Administered 2016-09-17: via INTRAVENOUS

## 2016-09-16 MED ORDER — CRANBERRY EXTRACT 250 MG PO TABS: 1.0000 | ORAL_TABLET | Freq: Every day | ORAL | Status: DC

## 2016-09-16 MED ORDER — CALCIUM GLUCONATE 500 MG PO TABS
500.0000 mg | ORAL_TABLET | Freq: Every day | ORAL | Status: DC
  Filled 2016-09-16: qty 1

## 2016-09-16 MED ORDER — VITAMIN D 1000 UNITS PO TABS
5000.0000 [IU] | ORAL_TABLET | Freq: Every day | ORAL | Status: DC
  Administered 2016-09-17 – 2016-09-18 (×2): 5000 [IU] via ORAL
  Filled 2016-09-16 (×3): qty 5

## 2016-09-16 MED ORDER — LORAZEPAM 2 MG/ML IJ SOLN
1.0000 mg | Freq: Once | INTRAMUSCULAR | Status: AC
  Administered 2016-09-17: 1 mg via INTRAVENOUS
  Filled 2016-09-16: qty 1

## 2016-09-16 MED ORDER — LUTEIN 6 MG PO CAPS: 6.0000 mg | ORAL_CAPSULE | Freq: Every day | ORAL | Status: DC

## 2016-09-16 MED ORDER — TRIAMCINOLONE ACETONIDE 0.5 % EX CREA: 1.0000 "application " | TOPICAL_CREAM | Freq: Two times a day (BID) | CUTANEOUS | Status: DC | PRN

## 2016-09-16 MED ORDER — ASPIRIN 325 MG PO TABS
325.0000 mg | ORAL_TABLET | Freq: Once | ORAL | Status: AC
  Administered 2016-09-16: 325 mg via ORAL
  Filled 2016-09-16: qty 1

## 2016-09-16 MED ORDER — VITAMIN B-12 1000 MCG PO TABS
1000.0000 ug | ORAL_TABLET | Freq: Every day | ORAL | Status: DC
  Administered 2016-09-17 – 2016-09-18 (×2): 1000 ug via ORAL
  Filled 2016-09-16 (×3): qty 1

## 2016-09-16 MED ORDER — ZOLPIDEM TARTRATE 5 MG PO TABS: 5.0000 mg | ORAL_TABLET | Freq: Every evening | ORAL | Status: DC | PRN

## 2016-09-16 MED ORDER — MAGNESIUM OXIDE 400 (241.3 MG) MG PO TABS
400.0000 mg | ORAL_TABLET | Freq: Every day | ORAL | Status: DC
  Administered 2016-09-17 – 2016-09-18 (×2): 400 mg via ORAL
  Filled 2016-09-16 (×3): qty 1

## 2016-09-16 MED ORDER — ASPIRIN 325 MG PO TABS
325.0000 mg | ORAL_TABLET | Freq: Every day | ORAL | Status: DC
  Administered 2016-09-17 – 2016-09-18 (×2): 325 mg via ORAL
  Filled 2016-09-16 (×2): qty 1

## 2016-09-16 NOTE — Progress Notes (Signed)
.  PHARMACIST - PHYSICIAN ORDER COMMUNICATION  CONCERNING: P&T Medication Policy on Herbal Medications  DESCRIPTION:  This patient's order for:  Cranberry extract and lutein  has been noted.  This product(s) is classified as an "herbal" or natural product. Due to a lack of definitive safety studies or FDA approval, nonstandard manufacturing practices, plus the potential risk of unknown drug-drug interactions while on inpatient medications, the Pharmacy and Therapeutics Committee does not permit the use of "herbal" or natural products of this type within [facility].   ACTION TAKEN: The pharmacy department is unable to verify this order at this time and your patient has been informed of this safety policy. Please reevaluate patient's clinical condition at discharge and address if the herbal or natural product(s) should be resumed at that time.  Sherlon Handing, PharmD, BCPS Clinical pharmacist, pager [phone] 09/16/2016 11:02 PM

## 2016-09-16 NOTE — H&P (Addendum)
History and Physical    [name] [id number] DOB: [birth year] DOA: 09/16/2016  Referring MD/NP/PA:   PCP: Mathews Argyle, MD   Patient coming from:  The patient is coming from home.  At baseline, pt is independent for most of ADL.      Chief Complaint: Slurred speech, left facial droop  HPI: [name] is a 82 y.o. female with medical history significant of hypertension, BPPV, DJD, varicose veins, who presents with a slurred speech and left facial droop.  Per patient's daughter, patient was noted to have left facial droop and mild slurred speech since 3 PM yesterday. Symptoms have been persistent. Patient does not have unilateral weakness, numbness or tingling sensations. Denies recent viral infection symptoms. She has chronic hearing loss, which has not changed recently. No vision change. Patient does not have chest pain, shortness of breath, cough. No nausea, vomiting or abdominal pain. She states she has passed out thick mucus in stool, not sure if she had diarrhea. She is taking psyllium. Patient states that she always urinates frequently since she drinks a lot of water, denies dysuria or burning on urination.  ED Course: pt was found to have WBC 7.0, electrolytes and renal function okay, INR 1.02, troponin negative, temperature normal, mildly tachycardia, oxygen saturation 99% on room air, negative CT head for acute intracranial abnormalities. Pending urinalysis. Patient is placed on telemetry bed observation. Neurology will be consulted by EDP.  Review of Systems:   General: no fevers, chills, no changes in body weight, no fatigue HEENT: no blurry vision, has chronic hearing loss. No sore throat Respiratory: no dyspnea, coughing, wheezing CV: no chest pain, no palpitations GI: no nausea, vomiting, abdominal pain, diarrhea, constipation GU: no dysuria, burning on urination, increased urinary frequency, hematuria  Ext: no leg edema Neuro: has left facial  droop and slurred speech. No vision change. No unilateral weakness in extremities. Skin: no rash, no skin tear. MSK: No muscle spasm, no deformity, no limitation of range of movement in spin Heme: No easy bruising.  Travel history: No recent long distant travel.  Allergy:  Allergies  Allergen Reactions  . Amoxicillin Diarrhea  . Ciprofloxacin Other (See Comments)    unknown  . Clindamycin/Lincomycin Diarrhea  . Codeine Nausea And Vomiting  . Tetanus Toxoids     unknown  . Ultracet [Tramadol-Acetaminophen] Nausea And Vomiting  . Penicillins Hives, Swelling and Rash    Has patient had a PCN reaction causing immediate rash, facial/tongue/throat swelling, SOB or lightheadedness with hypotension: swelling of arm, and rash Has patient had a PCN reaction causing severe rash involving mucus membranes or skin necrosis: unknown Has patient had a PCN reaction that required hospitalization : unknown Has patient had a PCN reaction occurring within the last 10 years: no If all of the above answers are "NO", then may proceed with Cephalosporin use.     Past Medical History:  Diagnosis Date  . Benign positional vertigo   . Bursitis started 10-2011   left leg and left hip  . DJD (degenerative joint disease)   . Hypertension   . Osteoporosis   . Varicose veins     Past Surgical History:  Procedure Laterality Date  . ABDOMINAL HYSTERECTOMY    . APPENDECTOMY    . CATARACT EXTRACTION     right  . HERNIA REPAIR    . KYPHOSIS SURGERY    . TUBAL LIGATION     bilateral    Social History:  reports that she has never smoked.  She does not have any smokeless tobacco history on file. She reports that she does not drink alcohol. Her drug history is not on file.  Family History:  Family History  Problem Relation Age of Onset  . Alzheimer's disease Mother   . Coronary artery disease Father   . Ulcers Father      Prior to Admission medications   Medication Sig Start Date End Date Taking?  Authorizing Provider  aspirin EC 81 MG tablet Take 81 mg by mouth daily with lunch.    Yes Historical Provider, MD  calcium gluconate 500 MG tablet Take 500 mg by mouth daily.     Yes Historical Provider, MD  Cholecalciferol (VITAMIN D) 2000 UNITS CAPS Take 5,000 capsules by mouth daily.    Yes Historical Provider, MD  Cranberry Extract 250 MG TABS Take 1 tablet by mouth daily.     Yes Historical Provider, MD  dextroamphetamine (DEXTROSTAT) 5 MG tablet Take 5 mg by mouth daily.     Yes Historical Provider, MD  fluticasone (FLONASE) 50 MCG/ACT nasal spray Place 2 sprays into both nostrils daily.   Yes Historical Provider, MD  ibuprofen (ADVIL,MOTRIN) 200 MG tablet Take 200 mg by mouth every 6 (six) hours as needed for headache or moderate pain.    Yes Historical Provider, MD  lisinopril (PRINIVIL,ZESTRIL) 10 MG tablet Take 10 mg by mouth daily.   Yes Historical Provider, MD  loratadine (CLARITIN) 10 MG tablet Take 10 mg by mouth daily as needed for allergies or itching.    Yes Historical Provider, MD  Lutein 6 MG CAPS Take 6 mg by mouth daily.     Yes Historical Provider, MD  Magnesium 300 MG CAPS Take 1 capsule by mouth daily.     Yes Historical Provider, MD  meclizine (ANTIVERT) 25 MG tablet Take 25 mg by mouth 2 (two) times daily as needed for dizziness.    Yes Historical Provider, MD  oxyCODONE-acetaminophen (PERCOCET/ROXICET) 5-325 MG tablet Take 0.5-1 tablets by mouth every 4 (four) hours as needed for severe pain.   Yes Historical Provider, MD  Psyllium (METAMUCIL FIBER PO) Take 1 scoop by mouth daily as needed (constipation).   Yes Historical Provider, MD  triamcinolone cream (KENALOG) 0.5 % Apply 1 application topically 2 (two) times daily as needed (rash/ eczema).   Yes Historical Provider, MD  vitamin B-12 (CYANOCOBALAMIN) 1000 MCG tablet Take 1,000 mcg by mouth daily.     Yes Historical Provider, MD    Physical Exam: Vitals:   09/16/16 1923 09/16/16 1930 09/16/16 1956 09/16/16 2027    BP: 174/95 172/81 172/81 176/80  Pulse: 91 91 94 93  Resp: 14 14 26 26   Temp:      TempSrc:      SpO2: 95% 100% 100% 98%  Weight:      Height:       General: Not in acute distress HEENT:       Eyes: PERRL, EOMI, no scleral icterus.       ENT: No discharge from the ears and nose, no pharynx injection, no tonsillar enlargement.        Neck: No JVD, no bruit, no mass felt. Heme: No neck lymph node enlargement. Cardiac: S1/S2, RRR, No murmurs, No gallops or rubs. Respiratory: No rales, wheezing, rhonchi or rubs. GI: Soft, nondistended, nontender, no rebound pain, no organomegaly, BS present. GU: No hematuria Ext: No pitting leg edema bilaterally. 2+DP/PT pulse bilaterally. Musculoskeletal: No joint deformities, No joint redness or warmth, no limitation  of ROM in spin. Skin: No rashes.  Neuro: Alert, oriented X3, cranial nerves II-XII grossly intact except left facial droop and left deviation of tongue. Muscle strength 5/5 in all extremities, sensation to light touch intact. Knee reflex 1+ bilaterally. Negative Babinski's sign. Normal finger to nose test. Psych: Patient is not psychotic, no suicidal or hemocidal ideation.  Labs on Admission: I have personally reviewed following labs and imaging studies  CBC:  Recent Labs Lab 09/16/16 1639 09/16/16 1652  WBC 7.0  --   NEUTROABS 4.3  --   HGB 12.1 12.9  HCT 37.6 38.0  MCV 87.9  --   PLT 310  --    Basic Metabolic Panel:  Recent Labs Lab 09/16/16 1639 09/16/16 1652  NA 137 139  K 4.2 4.2  CL 104 101  CO2 27  --   GLUCOSE 119* 120*  BUN 17 17  CREATININE 0.91 0.90  CALCIUM 9.6  --    GFR: Estimated Creatinine Clearance: 35.4 mL/min (by C-G formula based on SCr of 0.9 mg/dL). Liver Function Tests:  Recent Labs Lab 09/16/16 1639  AST 22  ALT 16  ALKPHOS 51  BILITOT 0.4  PROT 7.2  ALBUMIN 4.1   No results for input(s): LIPASE, AMYLASE in the last 168 hours. No results for input(s): AMMONIA in the last 168  hours. Coagulation Profile:  Recent Labs Lab 09/16/16 1639  INR 1.02   Cardiac Enzymes: No results for input(s): CKTOTAL, CKMB, CKMBINDEX, TROPONINI in the last 168 hours. BNP (last 3 results) No results for input(s): PROBNP in the last 8760 hours. HbA1C: No results for input(s): HGBA1C in the last 72 hours. CBG:  Recent Labs Lab 09/16/16 1643  GLUCAP 118*   Lipid Profile: No results for input(s): CHOL, HDL, LDLCALC, TRIG, CHOLHDL, LDLDIRECT in the last 72 hours. Thyroid Function Tests: No results for input(s): TSH, T4TOTAL, FREET4, T3FREE, THYROIDAB in the last 72 hours. Anemia Panel: No results for input(s): VITAMINB12, FOLATE, FERRITIN, TIBC, IRON, RETICCTPCT in the last 72 hours. Urine analysis:    Component Value Date/Time   COLORURINE YELLOW 09/16/2016 2018   APPEARANCEUR CLEAR 09/16/2016 2018   LABSPEC 1.007 09/16/2016 2018   PHURINE 8.0 09/16/2016 2018   GLUCOSEU NEGATIVE 09/16/2016 2018   HGBUR NEGATIVE 09/16/2016 2018   BILIRUBINUR NEGATIVE 09/16/2016 2018   KETONESUR NEGATIVE 09/16/2016 2018   PROTEINUR NEGATIVE 09/16/2016 2018   UROBILINOGEN 0.2 10/15/2007 1211   NITRITE NEGATIVE 09/16/2016 2018   LEUKOCYTESUR SMALL (A) 09/16/2016 2018   Sepsis Labs: @LABRCNTIP (procalcitonin:4,lacticidven:4) )No results found for this or any previous visit (from the past 240 hour(s)).   Radiological Exams on Admission: Ct Head Wo Contrast  Result Date: 09/16/2016 CLINICAL DATA:  Slurred speech, left facial droop EXAM: CT HEAD WITHOUT CONTRAST TECHNIQUE: Contiguous axial images were obtained from the base of the skull through the vertex without intravenous contrast. COMPARISON:  None. FINDINGS: Brain: No evidence of acute infarction, hemorrhage, extra-axial collection, ventriculomegaly, or mass effect. Generalized cerebral atrophy. Periventricular white matter low attenuation likely secondary to microangiopathy. Vascular: Cerebrovascular atherosclerotic calcifications are  noted. Skull: Negative for fracture or focal lesion. Sinuses/Orbits: Visualized portions of the orbits are unremarkable. Visualized portions of the paranasal sinuses and mastoid air cells are unremarkable. Other: None. IMPRESSION: 1. No acute intracranial pathology. 2. Chronic microvascular disease and cerebral atrophy. Electronically Signed   By: Kathreen Devoid   On: 09/16/2016 17:18     EKG: Independently reviewed.  Sinus rhythm, QTC 483, occasional PVC, nonspecific T-wave change.  Assessment/Plan Principal Problem:   Facial droop-left Active Problems:   Hypertension   Benign positional vertigo   Slurred speech   Facial droop-left and slurred speech: Etiology is not clear. Differential diagnosis include Bell's palsy and TIA/stroke. CT head is negative for acute intracranial abnormalities. Neurology will be consulted by EDP.  - will place on tele bed for obs - Neurology will be consulted by EDP, will follow up recommendations. -Atrial fibrillation: not present  -tPA was not given because symptoms are out of window for tPA - Risk factor modification: HgbA1c, fasting lipid panel - MRI, MRA of the brain without contrast-->will wait until patient is evaluated by neurology - Echocardiogram - Carotid dopplers - PT consult, OT consult - Bedside swallowing screen was ordered, will get speech consult in AM - Aspirin  Benign positional vertigo: -continue meclizine when necessary  Hypertension: -Continue lisinopril -IV hydralazine when necessary  Mucus in stool: An clear etiology. Patient does not have nausea, vomiting or abdominal pain. Patient is taking psyllium which may be related. -hold psyllium and observe closely   DVT ppx: SQ Lovenox Code Status: Full code Family Communication: Yes, patient's daughter at bed side Disposition Plan:  Anticipate discharge back to previous home environment Consults called:  Neurology will be consulted by EDP Admission status: Obs / tele   Date  of Service 09/16/2016    Ivor Costa [facility] Pager [phone]  If 7PM-7AM, please contact night-coverage [email or web address] Password [email or web address] 09/16/2016, 9:04 PM

## 2016-09-16 NOTE — ED Notes (Signed)
[facility] notified of need for transport to [facility] for admission.  [facility] advised significant wait in transport due to high volume of transports.

## 2016-09-16 NOTE — ED Notes (Signed)
Patient transported to CT 

## 2016-09-16 NOTE — ED Provider Notes (Signed)
[place] DEPT Provider Note   CSN: [id number] Arrival date & time: 09/16/16  1626     History   Chief Complaint Chief Complaint  Patient presents with  . Aphasia    HPI [name] is a 82 y.o. female.  HPI Patient was at a party yesterday and had a normal day. At approximately 3 PM she noticed that her left face felt a little numb and seemed a little droopy. She went home. She wasn't sure if the symptoms are really there. She also noticed she is having some trouble saying her 'S's". No other weakness numbness or tingling. No associated headache blurred vision chest pain or abdominal pain. No prior history of stroke. Symptoms persisted today. Past Medical History:  Diagnosis Date  . Benign positional vertigo   . Bursitis started 10-2011   left leg and left hip  . DJD (degenerative joint disease)   . Hypertension   . Osteoporosis   . Varicose veins     Patient Active Problem List   Diagnosis Date Noted  . CVA (cerebral infarction) 09/16/2016  . Facial droop-left 09/16/2016  . Slurred speech 09/16/2016  . Hypertension   . Benign positional vertigo   . Essential hypertension   . Varicose veins of lower extremities with other complications [id number]  . Pain in limb 01/06/2012    Past Surgical History:  Procedure Laterality Date  . ABDOMINAL HYSTERECTOMY    . APPENDECTOMY    . CATARACT EXTRACTION     right  . HERNIA REPAIR    . KYPHOSIS SURGERY    . TUBAL LIGATION     bilateral    OB History    No data available       Home Medications    Prior to Admission medications   Medication Sig Start Date End Date Taking? Authorizing Provider  aspirin EC 81 MG tablet Take 81 mg by mouth daily with lunch.    Yes Historical Provider, MD  calcium gluconate 500 MG tablet Take 500 mg by mouth daily.     Yes Historical Provider, MD  Cholecalciferol (VITAMIN D) 2000 UNITS CAPS Take 5,000 capsules by mouth daily.    Yes Historical Provider, MD  Cranberry  Extract 250 MG TABS Take 1 tablet by mouth daily.     Yes Historical Provider, MD  dextroamphetamine (DEXTROSTAT) 5 MG tablet Take 5 mg by mouth daily.     Yes Historical Provider, MD  fluticasone (FLONASE) 50 MCG/ACT nasal spray Place 2 sprays into both nostrils daily.   Yes Historical Provider, MD  ibuprofen (ADVIL,MOTRIN) 200 MG tablet Take 200 mg by mouth every 6 (six) hours as needed for headache or moderate pain.    Yes Historical Provider, MD  lisinopril (PRINIVIL,ZESTRIL) 10 MG tablet Take 10 mg by mouth daily.   Yes Historical Provider, MD  loratadine (CLARITIN) 10 MG tablet Take 10 mg by mouth daily as needed for allergies or itching.    Yes Historical Provider, MD  Lutein 6 MG CAPS Take 6 mg by mouth daily.     Yes Historical Provider, MD  Magnesium 300 MG CAPS Take 1 capsule by mouth daily.     Yes Historical Provider, MD  meclizine (ANTIVERT) 25 MG tablet Take 25 mg by mouth 2 (two) times daily as needed for dizziness.    Yes Historical Provider, MD  oxyCODONE-acetaminophen (PERCOCET/ROXICET) 5-325 MG tablet Take 0.5-1 tablets by mouth every 4 (four) hours as needed for severe pain.   Yes Historical Provider, MD  Psyllium (METAMUCIL FIBER PO) Take 1 scoop by mouth daily as needed (constipation).   Yes Historical Provider, MD  triamcinolone cream (KENALOG) 0.5 % Apply 1 application topically 2 (two) times daily as needed (rash/ eczema).   Yes Historical Provider, MD  vitamin B-12 (CYANOCOBALAMIN) 1000 MCG tablet Take 1,000 mcg by mouth daily.     Yes Historical Provider, MD    Family History Family History  Problem Relation Age of Onset  . Alzheimer's disease Mother   . Coronary artery disease Father   . Ulcers Father     Social History Social History  Substance Use Topics  . Smoking status: Never Smoker  . Smokeless tobacco: Not on file  . Alcohol use No     Allergies   Amoxicillin; Ciprofloxacin; Clindamycin/lincomycin; Codeine; Tetanus toxoids; Ultracet  [tramadol-acetaminophen]; and Penicillins   Review of Systems Review of Systems 10 Systems reviewed and are negative for acute change except as noted in the HPI.   Physical Exam Updated Vital Signs BP 156/88   Pulse 92   Temp 98.7 F (37.1 C) (Oral)   Resp 16   Ht 5' (1.524 m)   Wt 125 lb (56.7 kg)   SpO2 98%   BMI 24.41 kg/m   Physical Exam  Constitutional: She is oriented to person, place, and time. She appears well-developed and well-nourished. No distress.  HENT:  Head: Normocephalic and atraumatic.  Right Ear: External ear normal.  Left Ear: External ear normal.  Nose: Nose normal.  Mouth/Throat: Oropharynx is clear and moist.  Eyes: Conjunctivae are normal.  Neck: Neck supple.  Cardiovascular: Normal rate and regular rhythm.   No murmur heard. Pulmonary/Chest: Effort normal and breath sounds normal. No respiratory distress.  Abdominal: Soft. There is no tenderness.  Musculoskeletal: She exhibits no edema.  Neurological: She is alert and oriented to person, place, and time. A cranial nerve deficit is present. She exhibits normal muscle tone. Coordination normal.  Patient does have drooping of the left mouth. Slight tongue deviation with tongue extrusion. Posterior oropharynx widely pain with symmetric elevation of the palate. Other cranial nerves are intact. 5\5 upper shotty motor strength. 5\5 lower extremity motor strength. Sensation intact to light touch 4. Cognitive function normal.  Skin: Skin is warm and dry.  Psychiatric: She has a normal mood and affect.  Nursing note and vitals reviewed.    ED Treatments / Results  Labs (all labs ordered are listed, but only abnormal results are displayed) Labs Reviewed  COMPREHENSIVE METABOLIC PANEL - Abnormal; Notable for the following:       Result Value   Glucose, Bld 119 (*)    GFR calc non Af Amer 56 (*)    All other components within normal limits  URINALYSIS, ROUTINE W REFLEX MICROSCOPIC (NOT AT [facility]) -  Abnormal; Notable for the following:    Leukocytes, UA SMALL (*)    All other components within normal limits  URINE MICROSCOPIC-ADD ON - Abnormal; Notable for the following:    Squamous Epithelial / LPF 0-5 (*)    Bacteria, UA RARE (*)    All other components within normal limits  CBG MONITORING, ED - Abnormal; Notable for the following:    Glucose-Capillary 118 (*)    All other components within normal limits  I-STAT CHEM 8, ED - Abnormal; Notable for the following:    Glucose, Bld 120 (*)    All other components within normal limits  PROTIME-INR  APTT  CBC  DIFFERENTIAL  HEMOGLOBIN A1C  LIPID  PANEL  I-STAT TROPOININ, ED    EKG  EKG Interpretation None       Radiology Ct Head Wo Contrast  Result Date: 09/16/2016 CLINICAL DATA:  Slurred speech, left facial droop EXAM: CT HEAD WITHOUT CONTRAST TECHNIQUE: Contiguous axial images were obtained from the base of the skull through the vertex without intravenous contrast. COMPARISON:  None. FINDINGS: Brain: No evidence of acute infarction, hemorrhage, extra-axial collection, ventriculomegaly, or mass effect. Generalized cerebral atrophy. Periventricular white matter low attenuation likely secondary to microangiopathy. Vascular: Cerebrovascular atherosclerotic calcifications are noted. Skull: Negative for fracture or focal lesion. Sinuses/Orbits: Visualized portions of the orbits are unremarkable. Visualized portions of the paranasal sinuses and mastoid air cells are unremarkable. Other: None. IMPRESSION: 1. No acute intracranial pathology. 2. Chronic microvascular disease and cerebral atrophy. Electronically Signed   By: Kathreen Devoid   On: 09/16/2016 17:18    Procedures Procedures (including critical care time) CRITICAL CARE Performed by: Charlesetta Shanks   Total critical care time: 30 minutes  Critical care time was exclusive of separately billable procedures and treating other patients.  Critical care was necessary to treat or  prevent imminent or life-threatening deterioration.  Critical care was time spent personally by me on the following activities: development of treatment plan with patient and/or surrogate as well as nursing, discussions with consultants, evaluation of patient's response to treatment, examination of patient, obtaining history from patient or surrogate, ordering and performing treatments and interventions, ordering and review of laboratory studies, ordering and review of radiographic studies, pulse oximetry and re-evaluation of patient's condition. Medications Ordered in ED Medications  fluticasone (FLONASE) 50 MCG/ACT nasal spray 2 spray (not administered)  lisinopril (PRINIVIL,ZESTRIL) tablet 10 mg (not administered)  oxyCODONE-acetaminophen (PERCOCET/ROXICET) 5-325 MG per tablet 0.5-1 tablet (not administered)  triamcinolone cream (KENALOG) 0.5 % 1 application (not administered)  calcium gluconate tablet 500 mg (not administered)  Vitamin D CAPS 10,000,000 Units (not administered)  Cranberry Extract TABS 1 tablet (not administered)  dextroamphetamine (DEXTROSTAT) tablet 5 mg (not administered)  ibuprofen (ADVIL,MOTRIN) tablet 200 mg (not administered)  loratadine (CLARITIN) tablet 10 mg (not administered)  Lutein CAPS 6 mg (not administered)  Magnesium CAPS 300 mg (not administered)  meclizine (ANTIVERT) tablet 25 mg (not administered)  vitamin B-12 (CYANOCOBALAMIN) tablet 1,000 mcg (not administered)   stroke: mapping our early stages of recovery book (not administered)  0.9 %  sodium chloride infusion (not administered)  senna-docusate (Senokot-S) tablet 1 tablet (not administered)  enoxaparin (LOVENOX) injection 40 mg (not administered)  aspirin suppository 300 mg (not administered)    Or  aspirin tablet 325 mg (not administered)  zolpidem (AMBIEN) tablet 5 mg (not administered)  ondansetron (ZOFRAN) injection 4 mg (not administered)  hydrALAZINE (APRESOLINE) injection 5 mg (not  administered)  LORazepam (ATIVAN) injection 1 mg (not administered)  aspirin tablet 325 mg (325 mg Oral Given 09/16/16 1922)     Initial Impression / Assessment and Plan / ED Course  I have reviewed the triage vital signs and the nursing notes.  Pertinent labs & imaging results that were available during my care of the patient were reviewed by me and considered in my medical decision making (see chart for details).  Clinical Course   Consult: Reviewed with Dr. Donna Bernard for admission. Consult: Reviewed with Dr. Tressie Ellis for planned transfer to [facility] for probable CVA. Final Clinical Impressions(s) / ED Diagnoses   Final diagnoses:  Slurred speech  Facial droop  Essential hypertension  Benign positional vertigo, unspecified laterality  Patient is  alert with intact airway. Symptoms started at 3 PM yesterday. She is out of window for TPA treatment. Patient symptoms are limited to facial droop and slight slurring of speech. She is otherwise well.  New Prescriptions New Prescriptions   No medications on file     Charlesetta Shanks, MD 09/16/16 2133

## 2016-09-16 NOTE — ED Triage Notes (Signed)
Pt c/o slurred speech, and left facial droop starting yesterday at 3pm. Pt denies HA. Pt A+OX4, speaking in complete sentences, equal grip strength. Pt denies difficulty taking pills this AM.

## 2016-09-17 ENCOUNTER — Encounter (HOSPITAL_COMMUNITY): Payer: Self-pay | Admitting: *Deleted

## 2016-09-17 ENCOUNTER — Observation Stay (HOSPITAL_COMMUNITY): Payer: Medicare Other

## 2016-09-17 DIAGNOSIS — Z79899 Other long term (current) drug therapy: Secondary | ICD-10-CM | POA: Diagnosis not present

## 2016-09-17 DIAGNOSIS — R000 Tachycardia, unspecified: Secondary | ICD-10-CM | POA: Diagnosis present

## 2016-09-17 DIAGNOSIS — I10 Essential (primary) hypertension: Secondary | ICD-10-CM

## 2016-09-17 DIAGNOSIS — I639 Cerebral infarction, unspecified: Secondary | ICD-10-CM | POA: Diagnosis present

## 2016-09-17 DIAGNOSIS — Z887 Allergy status to serum and vaccine status: Secondary | ICD-10-CM | POA: Diagnosis not present

## 2016-09-17 DIAGNOSIS — Z888 Allergy status to other drugs, medicaments and biological substances status: Secondary | ICD-10-CM | POA: Diagnosis not present

## 2016-09-17 DIAGNOSIS — I6330 Cerebral infarction due to thrombosis of unspecified cerebral artery: Secondary | ICD-10-CM | POA: Insufficient documentation

## 2016-09-17 DIAGNOSIS — R195 Other fecal abnormalities: Secondary | ICD-10-CM | POA: Diagnosis present

## 2016-09-17 DIAGNOSIS — R29810 Facial weakness: Secondary | ICD-10-CM

## 2016-09-17 DIAGNOSIS — R29702 NIHSS score 2: Secondary | ICD-10-CM | POA: Diagnosis present

## 2016-09-17 DIAGNOSIS — E785 Hyperlipidemia, unspecified: Secondary | ICD-10-CM | POA: Diagnosis present

## 2016-09-17 DIAGNOSIS — Z7982 Long term (current) use of aspirin: Secondary | ICD-10-CM | POA: Diagnosis not present

## 2016-09-17 DIAGNOSIS — M810 Age-related osteoporosis without current pathological fracture: Secondary | ICD-10-CM | POA: Diagnosis present

## 2016-09-17 DIAGNOSIS — R4781 Slurred speech: Secondary | ICD-10-CM | POA: Diagnosis not present

## 2016-09-17 DIAGNOSIS — Z885 Allergy status to narcotic agent status: Secondary | ICD-10-CM | POA: Diagnosis not present

## 2016-09-17 DIAGNOSIS — Z880 Allergy status to penicillin: Secondary | ICD-10-CM | POA: Diagnosis not present

## 2016-09-17 DIAGNOSIS — H8110 Benign paroxysmal vertigo, unspecified ear: Secondary | ICD-10-CM | POA: Diagnosis not present

## 2016-09-17 DIAGNOSIS — Z90710 Acquired absence of both cervix and uterus: Secondary | ICD-10-CM | POA: Diagnosis not present

## 2016-09-17 DIAGNOSIS — I6789 Other cerebrovascular disease: Secondary | ICD-10-CM | POA: Diagnosis not present

## 2016-09-17 LAB — VAS US CAROTID
LCCADSYS: 69 cm/s
LCCAPDIAS: -16 cm/s
LCCAPSYS: -79 cm/s
LEFT VERTEBRAL DIAS: 7 cm/s
Left CCA dist dias: 15 cm/s
RCCAPDIAS: -26 cm/s
RIGHT ECA DIAS: -11 cm/s
RIGHT VERTEBRAL DIAS: 15 cm/s
Right CCA prox sys: -99 cm/s
Right cca dist sys: -124 cm/s

## 2016-09-17 NOTE — Consult Note (Signed)
Admission H&P    Chief Complaint: Acute onset of left facial weakness and slurred speech.  HPI: [name] is an 82 y.o. female history of hypertension, osteoporosis, degenerative joint disease and benign positional vertigo, brought to the ED for evaluation of onset left facial weakness and slurred speech. Onset was at 3 PM on 09/15/2016. There is no previous history of stroke nor TIA. She's been on aspirin daily. CT scan of her head showed no acute intracranial abnormality. No weakness no numbness involving left extremities. NIH stroke score at the time of this evaluation was 2.  LSN: 3:00 PM on 09/15/2016 tPA Given: No: Beyond time window for treatment consideration mRankin:  Past Medical History:  Diagnosis Date  . Benign positional vertigo   . Bursitis started 10-2011   left leg and left hip  . DJD (degenerative joint disease)   . Hypertension   . Osteoporosis   . Varicose veins     Past Surgical History:  Procedure Laterality Date  . ABDOMINAL HYSTERECTOMY    . APPENDECTOMY    . CATARACT EXTRACTION     right  . HERNIA REPAIR    . KYPHOSIS SURGERY    . TUBAL LIGATION     bilateral    Family History  Problem Relation Age of Onset  . Alzheimer's disease Mother   . Coronary artery disease Father   . Ulcers Father    Social History:  reports that she has never smoked. She does not have any smokeless tobacco history on file. She reports that she does not drink alcohol. Her drug history is not on file.  Allergies:  Allergies  Allergen Reactions  . Amoxicillin Diarrhea  . Ciprofloxacin Other (See Comments)    unknown  . Clindamycin/Lincomycin Diarrhea  . Codeine Nausea And Vomiting  . Tetanus Toxoids     unknown  . Ultracet [Tramadol-Acetaminophen] Nausea And Vomiting  . Penicillins Hives, Swelling and Rash    Has patient had a PCN reaction causing immediate rash, facial/tongue/throat swelling, SOB or lightheadedness with hypotension: swelling of arm, and  rash Has patient had a PCN reaction causing severe rash involving mucus membranes or skin necrosis: unknown Has patient had a PCN reaction that required hospitalization : unknown Has patient had a PCN reaction occurring within the last 10 years: no If all of the above answers are "NO", then may proceed with Cephalosporin use.     Medications Prior to Admission  Medication Sig Dispense Refill  . aspirin EC 81 MG tablet Take 81 mg by mouth daily with lunch.     . calcium gluconate 500 MG tablet Take 500 mg by mouth daily.      . Cholecalciferol (VITAMIN D) 2000 UNITS CAPS Take 5,000 capsules by mouth daily.     . Cranberry Extract 250 MG TABS Take 1 tablet by mouth daily.      Marland Kitchen dextroamphetamine (DEXTROSTAT) 5 MG tablet Take 5 mg by mouth daily.      . fluticasone (FLONASE) 50 MCG/ACT nasal spray Place 2 sprays into both nostrils daily.    Marland Kitchen ibuprofen (ADVIL,MOTRIN) 200 MG tablet Take 200 mg by mouth every 6 (six) hours as needed for headache or moderate pain.     Marland Kitchen lisinopril (PRINIVIL,ZESTRIL) 10 MG tablet Take 10 mg by mouth daily.    Marland Kitchen loratadine (CLARITIN) 10 MG tablet Take 10 mg by mouth daily as needed for allergies or itching.     . Lutein 6 MG CAPS Take 6 mg by mouth  daily.      . Magnesium 300 MG CAPS Take 1 capsule by mouth daily.      . meclizine (ANTIVERT) 25 MG tablet Take 25 mg by mouth 2 (two) times daily as needed for dizziness.     Marland Kitchen oxyCODONE-acetaminophen (PERCOCET/ROXICET) 5-325 MG tablet Take 0.5-1 tablets by mouth every 4 (four) hours as needed for severe pain.    Marland Kitchen Psyllium (METAMUCIL FIBER PO) Take 1 scoop by mouth daily as needed (constipation).    . triamcinolone cream (KENALOG) 0.5 % Apply 1 application topically 2 (two) times daily as needed (rash/ eczema).    . vitamin B-12 (CYANOCOBALAMIN) 1000 MCG tablet Take 1,000 mcg by mouth daily.        ROS: History obtained from the patient  General ROS: negative for - chills, fatigue, fever, night sweats, weight  gain or weight loss Psychological ROS: negative for - behavioral disorder, hallucinations, memory difficulties, mood swings or suicidal ideation Ophthalmic ROS: negative for - blurry vision, double vision, eye pain or loss of vision ENT ROS: negative for - epistaxis, nasal discharge, oral lesions, sore throat, tinnitus or vertigo Allergy and Immunology ROS: negative for - hives or itchy/watery eyes Hematological and Lymphatic ROS: negative for - bleeding problems, bruising or swollen lymph nodes Endocrine ROS: negative for - galactorrhea, hair pattern changes, polydipsia/polyuria or temperature intolerance Respiratory ROS: negative for - cough, hemoptysis, shortness of breath or wheezing Cardiovascular ROS: negative for - chest pain, dyspnea on exertion, edema or irregular heartbeat Gastrointestinal ROS: negative for - abdominal pain, diarrhea, hematemesis, nausea/vomiting or stool incontinence Genito-Urinary ROS: negative for - dysuria, hematuria, incontinence or urinary frequency/urgency Musculoskeletal ROS: negative for - joint swelling or muscular weakness Neurological ROS: as noted in HPI Dermatological ROS: negative for rash and skin lesion changes  Physical Examination: Blood pressure (!) 153/60, pulse 100, temperature 98.1 F (36.7 C), temperature source Oral, resp. rate 16, height 5' (1.524 m), weight 56.7 kg (125 lb), SpO2 100 %.  HEENT-  Normocephalic, no lesions, without obvious abnormality.  Normal external eye and conjunctiva.  Normal TM's bilaterally.  Normal auditory canals and external ears. Normal external nose, mucus membranes and septum.  Normal pharynx. Neck supple with no masses, nodes, nodules or enlargement. Cardiovascular - regular rate and rhythm, S1, S2 normal, no murmur, click, rub or gallop Lungs - chest clear, no wheezing, rales, normal symmetric air entry Abdomen - soft, non-tender; bowel sounds normal; no masses,  no organomegaly Extremities - no joint  deformities, effusion, or inflammation and no edema  Neurologic Examination: Mental Status: Alert, oriented, thought content appropriate.  Speech fluent without evidence of aphasia. Able to follow commands without difficulty. Cranial Nerves: II-Visual fields were normal. III/IV/VI-Pupils were equal and reacted normally to light. Extraocular movements were full and conjugate.    V/VII-no facial numbness; mild to moderate left lower facial weakness. VIII-moderate difficulty with hearing bilaterally. X-mild dysarthria; symmetrical palatal movement. XI: trapezius strength/neck flexion strength normal bilaterally XII-midline tongue extension with normal strength. Motor: 5/5 bilaterally with normal tone and bulk Sensory: Normal throughout. Deep Tendon Reflexes: 1+ and symmetric. Plantars: Mute bilaterally Cerebellar: Normal finger-to-nose testing. Carotid auscultation: Normal  Results for orders placed or performed during the hospital encounter of 09/16/16 (from the past 48 hour(s))  Protime-INR     Status: None   Collection Time: 09/16/16  4:39 PM  Result Value Ref Range   Prothrombin Time 13.4 11.4 - 15.2 seconds   INR 1.02   APTT     Status:  None   Collection Time: 09/16/16  4:39 PM  Result Value Ref Range   aPTT 32 24 - 36 seconds  CBC     Status: None   Collection Time: 09/16/16  4:39 PM  Result Value Ref Range   WBC 7.0 4.0 - 10.5 K/uL   RBC 4.28 3.87 - 5.11 MIL/uL   Hemoglobin 12.1 12.0 - 15.0 g/dL   HCT 37.6 36.0 - 46.0 %   MCV 87.9 78.0 - 100.0 fL   MCH 28.3 26.0 - 34.0 pg   MCHC 32.2 30.0 - 36.0 g/dL   RDW 14.6 11.5 - 15.5 %   Platelets 310 150 - 400 K/uL  Differential     Status: None   Collection Time: 09/16/16  4:39 PM  Result Value Ref Range   Neutrophils Relative % 62 %   Neutro Abs 4.3 1.7 - 7.7 K/uL   Lymphocytes Relative 20 %   Lymphs Abs 1.4 0.7 - 4.0 K/uL   Monocytes Relative 15 %   Monocytes Absolute 1.0 0.1 - 1.0 K/uL   Eosinophils Relative 2 %    Eosinophils Absolute 0.2 0.0 - 0.7 K/uL   Basophils Relative 1 %   Basophils Absolute 0.1 0.0 - 0.1 K/uL  Comprehensive metabolic panel     Status: Abnormal   Collection Time: 09/16/16  4:39 PM  Result Value Ref Range   Sodium 137 135 - 145 mmol/L   Potassium 4.2 3.5 - 5.1 mmol/L   Chloride 104 101 - 111 mmol/L   CO2 27 22 - 32 mmol/L   Glucose, Bld 119 (H) 65 - 99 mg/dL   BUN 17 6 - 20 mg/dL   Creatinine, Ser 0.91 0.44 - 1.00 mg/dL   Calcium 9.6 8.9 - 10.3 mg/dL   Total Protein 7.2 6.5 - 8.1 g/dL   Albumin 4.1 3.5 - 5.0 g/dL   AST 22 15 - 41 U/L   ALT 16 14 - 54 U/L   Alkaline Phosphatase 51 38 - 126 U/L   Total Bilirubin 0.4 0.3 - 1.2 mg/dL   GFR calc non Af Amer 56 (L) >60 mL/min   GFR calc Af Amer >60 >60 mL/min    Comment: (NOTE) The eGFR has been calculated using the CKD EPI equation. This calculation has not been validated in all clinical situations. eGFR's persistently <60 mL/min signify possible Chronic Kidney Disease.    Anion gap 6 5 - 15  CBG monitoring, ED     Status: Abnormal   Collection Time: 09/16/16  4:43 PM  Result Value Ref Range   Glucose-Capillary 118 (H) 65 - 99 mg/dL  I-stat troponin, ED     Status: None   Collection Time: 09/16/16  4:50 PM  Result Value Ref Range   Troponin i, poc 0.00 0.00 - 0.08 ng/mL   Comment 3            Comment: Due to the release kinetics of cTnI, a negative result within the first hours of the onset of symptoms does not rule out myocardial infarction with certainty. If myocardial infarction is still suspected, repeat the test at appropriate intervals.   I-Stat Chem 8, ED     Status: Abnormal   Collection Time: 09/16/16  4:52 PM  Result Value Ref Range   Sodium 139 135 - 145 mmol/L   Potassium 4.2 3.5 - 5.1 mmol/L   Chloride 101 101 - 111 mmol/L   BUN 17 6 - 20 mg/dL   Creatinine, Ser 0.90 0.44 -  1.00 mg/dL   Glucose, Bld 120 (H) 65 - 99 mg/dL   Calcium, Ion 1.19 1.15 - 1.40 mmol/L   TCO2 26 0 - 100 mmol/L    Hemoglobin 12.9 12.0 - 15.0 g/dL   HCT 38.0 36.0 - 46.0 %  Urinalysis, Routine w reflex microscopic (not at [facility])     Status: Abnormal   Collection Time: 09/16/16  8:18 PM  Result Value Ref Range   Color, Urine YELLOW YELLOW   APPearance CLEAR CLEAR   Specific Gravity, Urine 1.007 1.005 - 1.030   pH 8.0 5.0 - 8.0   Glucose, UA NEGATIVE NEGATIVE mg/dL   Hgb urine dipstick NEGATIVE NEGATIVE   Bilirubin Urine NEGATIVE NEGATIVE   Ketones, ur NEGATIVE NEGATIVE mg/dL   Protein, ur NEGATIVE NEGATIVE mg/dL   Nitrite NEGATIVE NEGATIVE   Leukocytes, UA SMALL (A) NEGATIVE  Urine microscopic-add on     Status: Abnormal   Collection Time: 09/16/16  8:18 PM  Result Value Ref Range   Squamous Epithelial / LPF 0-5 (A) NONE SEEN   WBC, UA 0-5 0 - 5 WBC/hpf   RBC / HPF NONE SEEN 0 - 5 RBC/hpf   Bacteria, UA RARE (A) NONE SEEN   Urine-Other LESS THAN 10 mL OF URINE SUBMITTED    Ct Head Wo Contrast  Result Date: 09/16/2016 CLINICAL DATA:  Slurred speech, left facial droop EXAM: CT HEAD WITHOUT CONTRAST TECHNIQUE: Contiguous axial images were obtained from the base of the skull through the vertex without intravenous contrast. COMPARISON:  None. FINDINGS: Brain: No evidence of acute infarction, hemorrhage, extra-axial collection, ventriculomegaly, or mass effect. Generalized cerebral atrophy. Periventricular white matter low attenuation likely secondary to microangiopathy. Vascular: Cerebrovascular atherosclerotic calcifications are noted. Skull: Negative for fracture or focal lesion. Sinuses/Orbits: Visualized portions of the orbits are unremarkable. Visualized portions of the paranasal sinuses and mastoid air cells are unremarkable. Other: None. IMPRESSION: 1. No acute intracranial pathology. 2. Chronic microvascular disease and cerebral atrophy. Electronically Signed   By: Kathreen Devoid   On: 09/16/2016 17:18    Assessment: 82 y.o. 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 HgbA1c, fasting lipid panel 2. MRI, MRA  of the brain without contrast 3. PT consult, OT consult, Speech consult 4. Echocardiogram 5. Carotid dopplers 6. Prophylactic therapy-Antiplatelet med: Aspirin  7. Risk factor modification 8. Telemetry monitoring  C.R. Nicole Kindred, MD [facility] [phone]  09/17/2016, 12:08 AM

## 2016-09-17 NOTE — Progress Notes (Signed)
Pt back to floor.

## 2016-09-17 NOTE — Progress Notes (Signed)
STROKE TEAM PROGRESS NOTE   HISTORY OF PRESENT ILLNESS (per record) [name] is an 82 y.o. female history of hypertension, osteoporosis, degenerative joint disease and benign positional vertigo, brought to the ED for evaluation of onset left facial weakness and slurred speech. Onset was at 3 PM on 09/15/2016. There is no previous history of stroke nor TIA. She's been on aspirin daily. CT scan of her head showed no acute intracranial abnormality. No weakness no numbness involving left extremities. NIH stroke score at the time of this evaluation was 2. Patient was not administered IV t-PA secondary to Beyond time window for treatment consideration. She was admitted for further evaluation and treatment.   SUBJECTIVE (INTERVAL HISTORY)  She states she is back to her baseline patient weakness and speech has resolved. She states she was not taking aspirin regularly.   OBJECTIVE Temp:  [97.7 F (36.5 C)-98.7 F (37.1 C)] 98.7 F (37.1 C) (09/21 1730) Pulse Rate:  [77-103] 90 (09/21 1730) Cardiac Rhythm: Normal sinus rhythm (09/21 0744) Resp:  [12-26] 18 (09/21 1730) BP: (125-177)/(55-95) 158/66 (09/21 1730) SpO2:  [95 %-100 %] 99 % (09/21 1730)  CBC:  Recent Labs Lab 09/16/16 1639 09/16/16 1652  WBC 7.0  --   NEUTROABS 4.3  --   HGB 12.1 12.9  HCT 37.6 38.0  MCV 87.9  --   PLT 310  --     Basic Metabolic Panel:  Recent Labs Lab 09/16/16 1639 09/16/16 1652  NA 137 139  K 4.2 4.2  CL 104 101  CO2 27  --   GLUCOSE 119* 120*  BUN 17 17  CREATININE 0.91 0.90  CALCIUM 9.6  --     Lipid Panel: No results found for: CHOL, TRIG, HDL, CHOLHDL, VLDL, LDLCALC HgbA1c: No results found for: HGBA1C Urine Drug Screen: No results found for: LABOPIA, COCAINSCRNUR, LABBENZ, AMPHETMU, THCU, LABBARB    IMAGING  Ct Head Wo Contrast  Result Date: 09/16/2016 CLINICAL DATA:  Slurred speech, left facial droop EXAM: CT HEAD WITHOUT CONTRAST TECHNIQUE: Contiguous axial images were  obtained from the base of the skull through the vertex without intravenous contrast. COMPARISON:  None. FINDINGS: Brain: No evidence of acute infarction, hemorrhage, extra-axial collection, ventriculomegaly, or mass effect. Generalized cerebral atrophy. Periventricular white matter low attenuation likely secondary to microangiopathy. Vascular: Cerebrovascular atherosclerotic calcifications are noted. Skull: Negative for fracture or focal lesion. Sinuses/Orbits: Visualized portions of the orbits are unremarkable. Visualized portions of the paranasal sinuses and mastoid air cells are unremarkable. Other: None. IMPRESSION: 1. No acute intracranial pathology. 2. Chronic microvascular disease and cerebral atrophy. Electronically Signed   By: Kathreen Devoid   On: 09/16/2016 17:18   Mr Brain Wo Contrast  Result Date: 09/17/2016 CLINICAL DATA:  Initial evaluation for acute onset left facial weakness with slurred speech. EXAM: MRI HEAD WITHOUT CONTRAST MRA HEAD WITHOUT CONTRAST TECHNIQUE: Multiplanar, multiecho pulse sequences of the brain and surrounding structures were obtained without intravenous contrast. Angiographic images of the head were obtained using MRA technique without contrast. COMPARISON:  Prior CT from 09/16/2016. FINDINGS: MRI HEAD FINDINGS Brain: The study is degraded by motion artifact. Patchy and confluent T2/FLAIR hyperintensity within the periventricular and deep white matter both cerebral hemispheres most consistent with chronic microvascular ischemic changes, mild for age. Small remote cortical infarct present within the parasagittal left parietal lobe. No other areas of remote infarction identified. Abnormal restricted diffusion measuring approximately 16 mm present within the central aspect of the right corona radiata/centrum semi ovale (series 3,  image 30), consistent with acute ischemic infarct. No associated hemorrhage or mass effect. No other evidence for acute infarction. Gray-white matter  differentiation maintained. No acute or chronic intracranial hemorrhage. No mass lesion, midline shift, or mass effect. No hydrocephalus. No extra-axial fluid collection. Major dural sinuses are grossly patent. Pituitary gland normal. Vascular: Major intracranial vascular flow voids are maintained. Skull and upper cervical spine: Craniocervical junction normal. Visualized upper cervical spine within normal limits. Bone marrow signal intensity normal. No scalp soft tissue abnormality. Sinuses/Orbits: Globes and orbital soft tissues within normal limits. Patient status post cataract extraction bilaterally. Paranasal sinuses are clear. Trace opacity posterior right mastoid air cells. Mastoid air cells are otherwise clear. Inner ear structures grossly normal. Other: No other significant finding. MRA HEAD FINDINGS ANTERIOR CIRCULATION: Study fairly degraded by motion artifact. Distal cervical segments of the internal carotid arteries are patent with antegrade flow. Petrous, cavernous, and supraclinoid segments patent without flow-limiting stenosis. A1 segments grossly patent. Anterior cerebral arteries patent to their distal aspects. No obvious stenosis, although evaluation limited by motion. M1 segments grossly patent without occlusion or obvious stenosis. Distal MCA branches fairly symmetric, but poorly evaluated on this motion degraded study. POSTERIOR CIRCULATION: Right vertebral artery dominant and patent to the vertebrobasilar junction. Diminutive left vertebral artery patent as well. Posterior inferior cerebral arteries patent proximally. Basilar artery well opacified to its distal aspect. Superior cerebral arteries patent proximally. Left PCA arises from the basilar artery and is opacified to its distal aspect. Fetal type right PCA supplied via the right posterior communicating artery. Right PCA also supplied to its distal aspect. No obvious stenosis within the posterior circulation. No aneurysm or vascular  malformation. IMPRESSION: MRI HEAD IMPRESSION: 1. 16 mm acute ischemic nonhemorrhagic infarct within the central right centrum semi ovale/corona radiata. 2. Small remote left parietal cortical infarct as above. 3. Mild chronic microvascular ischemic disease. MRA HEAD IMPRESSION: Motion degraded study. Negative for large vessel occlusion. No obvious high-grade or correctable stenosis. Electronically Signed   By: Jeannine Boga M.D.   On: 09/17/2016 04:04   Mr Jodene Nam Head/brain [id number] Cm  Result Date: 09/17/2016 CLINICAL DATA:  Initial evaluation for acute onset left facial weakness with slurred speech. EXAM: MRI HEAD WITHOUT CONTRAST MRA HEAD WITHOUT CONTRAST TECHNIQUE: Multiplanar, multiecho pulse sequences of the brain and surrounding structures were obtained without intravenous contrast. Angiographic images of the head were obtained using MRA technique without contrast. COMPARISON:  Prior CT from 09/16/2016. FINDINGS: MRI HEAD FINDINGS Brain: The study is degraded by motion artifact. Patchy and confluent T2/FLAIR hyperintensity within the periventricular and deep white matter both cerebral hemispheres most consistent with chronic microvascular ischemic changes, mild for age. Small remote cortical infarct present within the parasagittal left parietal lobe. No other areas of remote infarction identified. Abnormal restricted diffusion measuring approximately 16 mm present within the central aspect of the right corona radiata/centrum semi ovale (series 3, image 30), consistent with acute ischemic infarct. No associated hemorrhage or mass effect. No other evidence for acute infarction. Gray-white matter differentiation maintained. No acute or chronic intracranial hemorrhage. No mass lesion, midline shift, or mass effect. No hydrocephalus. No extra-axial fluid collection. Major dural sinuses are grossly patent. Pituitary gland normal. Vascular: Major intracranial vascular flow voids are maintained. Skull and upper  cervical spine: Craniocervical junction normal. Visualized upper cervical spine within normal limits. Bone marrow signal intensity normal. No scalp soft tissue abnormality. Sinuses/Orbits: Globes and orbital soft tissues within normal limits. Patient status post cataract extraction bilaterally. Paranasal sinuses are  clear. Trace opacity posterior right mastoid air cells. Mastoid air cells are otherwise clear. Inner ear structures grossly normal. Other: No other significant finding. MRA HEAD FINDINGS ANTERIOR CIRCULATION: Study fairly degraded by motion artifact. Distal cervical segments of the internal carotid arteries are patent with antegrade flow. Petrous, cavernous, and supraclinoid segments patent without flow-limiting stenosis. A1 segments grossly patent. Anterior cerebral arteries patent to their distal aspects. No obvious stenosis, although evaluation limited by motion. M1 segments grossly patent without occlusion or obvious stenosis. Distal MCA branches fairly symmetric, but poorly evaluated on this motion degraded study. POSTERIOR CIRCULATION: Right vertebral artery dominant and patent to the vertebrobasilar junction. Diminutive left vertebral artery patent as well. Posterior inferior cerebral arteries patent proximally. Basilar artery well opacified to its distal aspect. Superior cerebral arteries patent proximally. Left PCA arises from the basilar artery and is opacified to its distal aspect. Fetal type right PCA supplied via the right posterior communicating artery. Right PCA also supplied to its distal aspect. No obvious stenosis within the posterior circulation. No aneurysm or vascular malformation. IMPRESSION: MRI HEAD IMPRESSION: 1. 16 mm acute ischemic nonhemorrhagic infarct within the central right centrum semi ovale/corona radiata. 2. Small remote left parietal cortical infarct as above. 3. Mild chronic microvascular ischemic disease. MRA HEAD IMPRESSION: Motion degraded study. Negative for large  vessel occlusion. No obvious high-grade or correctable stenosis. Electronically Signed   By: Jeannine Boga M.D.   On: 09/17/2016 04:04   Carotid Doppler   There is 1-39% bilateral ICA stenosis. Vertebral artery flow is antegrade.    PHYSICAL EXAM Pleasant elderly lady not in distress. . Afebrile. Head is nontraumatic. Neck is supple without bruit.    Cardiac exam no murmur or gallop. Lungs are clear to auscultation. Distal pulses are well felt.   Neurological Exam ;   Awake  Alert oriented x 3. Normal speech and language.eye movements full without nystagmus.fundi were not visualized. Vision acuity and fields appear normal. Hearing is normal. Palatal movements are normal. Face symmetric. Tongue midline. Normal strength, tone, reflexes and coordination. Normal sensation. Gait deferred.  ASSESSMENT/PLAN [name] is a 82 y.o. female with history of hypertension, osteoporosis, degenerative joint disease and benign positional vertigo presenting with left facial weakness and slurred speech. She did not receive IV t-PA due to Beyond time window for treatment consideration.   Stroke:  Right centrum semiovale/corona radiata lacunar infarct secondary to small vessel disease   MRI  right centrum semiovale/corona radiata lacunar infarct  MRA  No large vessel occlusion/stenosis  Carotid Doppler  No significant ICA stenosis  2D Echo  pending  LDL pending  HgbA1c pending  Lovenox 40 mg sq daily for VTE prophylaxis  Diet Heart Room service appropriate? Yes; Fluid consistency: Thin  aspirin 81 mg daily prior to admission but not taking it routinely, now on aspirin 325 mg daily. Continue aspirin at discharge  Patient counseled to be compliant with her antithrombotic medications  Ongoing aggressive stroke risk factor management  Therapy recommendations:  HH PT  Disposition:  pending   Hypertension  Stable Permissive hypertension (OK if < 220/120) but gradually  normalize in 5-7 days Long-term BP goal normotensive  Other Stroke Risk Factors  Advanced age  Other Active Problems  BPPV  Mucus in stool  Hospital day # 0  I have personally examined this patient, reviewed notes, independently viewed imaging studies, participated in medical decision making and plan of care.ROS completed by me personally and pertinent positives fully documented  I have  made any additions or clarifications directly to the above note.  She presented with transient slurred speech and facial weakness secondary to right brain lacunar infarct likely from small vessel disease. She remains at risk for neurological worsening, recurrent stroke, TIA needs ongoing stroke evaluation and aggressive risk factor modification. Start aspirin 325 mg daily. I counseled the patient to be compliant with her medications. I had a long discussion with the patient and her son at the bedside and answered questions. Greater than 50% time during this 35 minute visit was spent on counseling and coordination of care about stroke, risk factor, prevention and treatment  Antony Contras, MD Medical Director [place] Pager: [phone] 09/17/2016 6:56 PM     To contact Stroke Continuity provider, please refer to [email or web address] After hours, contact General Neurology

## 2016-09-17 NOTE — Evaluation (Signed)
Physical Therapy Evaluation Patient Details Name: [name] MRN: [id number] DOB: 03-30-1928 Today's Date: 09/17/2016   History of Present Illness  [name] is a 82 y.o. female with medical history significant of hypertension, BPPV, DJD, varicose veins, who presents with a slurred speech and left facial droop. MRI positive for 16 mm acute ischemic nonhemorrhagic infarct in R centrum semi ovale/corona radiata.    Clinical Impression  Patient presents with decreased mobility due to mild balance deficits and weakness.  Feel she may have had some previous to this episode and is exacerbated by bedrest/hospitalization.  Feel she will be safe to d/c home with intermittent help from family and friends and with HHPT for safety eval.  Discussed ways to get back into community exercise and encouraged her to seek clearance from her PCP prior to starting exercise participation. Will follow up acutely if not d/c.     Follow Up Recommendations Home health PT (safety evaluation)    Equipment Recommendations  None recommended by PT    Recommendations for Other Services       Precautions / Restrictions Precautions Precautions: Fall      Mobility  Bed Mobility Overal bed mobility: Modified Independent                Transfers Overall transfer level: Needs assistance   Transfers: Sit to/from Stand Sit to Stand: Supervision         General transfer comment: initially steady, then with LOB with self recovery  Ambulation/Gait Ambulation/Gait assistance: Supervision;Min guard Ambulation Distance (Feet): 250 Feet Assistive device: None Gait Pattern/deviations: Step-through pattern;Drifts right/left;Decreased stride length     General Gait Details: initially felt unstable, but donned shoes with improved stability and no LOB after with initial standing  Stairs Stairs: Yes Stairs assistance: Supervision Stair Management: Two rails;Alternating  pattern;Forwards Number of Stairs: 2    Wheelchair Mobility    Modified Rankin (Stroke Patients Only) Modified Rankin (Stroke Patients Only) Pre-Morbid Rankin Score: No symptoms Modified Rankin: Slight disability     Balance Overall balance assessment: Needs assistance                               Standardized Balance Assessment Standardized Balance Assessment : Dynamic Gait Index   Dynamic Gait Index Level Surface: Mild Impairment Change in Gait Speed: Normal Gait with Horizontal Head Turns: Mild Impairment Gait with Vertical Head Turns: Mild Impairment Gait and Pivot Turn: Normal Step Over Obstacle: Mild Impairment Step Around Obstacles: Normal Steps: Mild Impairment Total Score: 19       Pertinent Vitals/Pain Pain Assessment: No/denies pain    Home Living Family/patient expects to be discharged to:: Private residence Living Arrangements: Alone Available Help at Discharge: Family;Available PRN/intermittently (son) Type of Home: House Home Access: Stairs to enter Entrance Stairs-Rails: None Entrance Stairs-Number of Steps: 2 Home Layout: Two level;Able to live on main level with bedroom/bathroom Home Equipment: Shower seat - built in;Hand held shower head      Prior Function Level of Independence: Independent         Comments: wears glasses     Hand Dominance   Dominant Hand: Right    Extremity/Trunk Assessment   Upper Extremity Assessment: Overall WFL for tasks assessed           Lower Extremity Assessment: Overall WFL for tasks assessed      Cervical / Trunk Assessment: Kyphotic  Communication   Communication: No difficulties  Cognition Arousal/Alertness: Awake/alert Behavior During Therapy: WFL for tasks assessed/performed Overall Cognitive Status: Within Functional Limits for tasks assessed                      General Comments      Exercises     Assessment/Plan    PT Assessment All further PT needs  can be met in the next venue of care  PT Problem List Decreased balance;Decreased knowledge of use of DME;Decreased safety awareness          PT Treatment Interventions      PT Goals (Current goals can be found in the Care Plan section)  Acute Rehab PT Goals Patient Stated Goal: To return to independent PT Goal Formulation: With patient/family Time For Goal Achievement: 09/19/16 Potential to Achieve Goals: Good    Frequency     Barriers to discharge        Co-evaluation               End of Session Equipment Utilized During Treatment: Gait belt Activity Tolerance: Patient tolerated treatment well Patient left: in bed;with call bell/phone within reach;with family/visitor present      Functional Assessment Tool Used: Clinical Judgement Functional Limitation: Mobility: Walking and moving around Mobility: Walking and Moving Around Current Status (W0981): At least 1 percent but less than 20 percent impaired, limited or restricted Mobility: Walking and Moving Around Goal Status [phone]): At least 1 percent but less than 20 percent impaired, limited or restricted Mobility: Walking and Moving Around Discharge Status [phone]): At least 1 percent but less than 20 percent impaired, limited or restricted    Time: 1335-1404 PT Time Calculation (min) (ACUTE ONLY): 29 min   Charges:   PT Evaluation $PT Eval Moderate Complexity: 1 Procedure PT Treatments $Gait Training: 8-22 mins   PT G Codes:   PT G-Codes **NOT FOR INPATIENT CLASS** Functional Assessment Tool Used: Clinical Judgement Functional Limitation: Mobility: Walking and moving around Mobility: Walking and Moving Around Current Status (O1308): At least 1 percent but less than 20 percent impaired, limited or restricted Mobility: Walking and Moving Around Goal Status [phone]): At least 1 percent but less than 20 percent impaired, limited or restricted Mobility: Walking and Moving Around Discharge Status [phone]): At least 1  percent but less than 20 percent impaired, limited or restricted    Reginia Naas 09/17/2016, 4:55 PM  Magda Kiel, [place] 09/17/2016

## 2016-09-17 NOTE — Progress Notes (Signed)
Pt off floor for test.

## 2016-09-17 NOTE — Progress Notes (Signed)
VASCULAR LAB PRELIMINARY  PRELIMINARY  PRELIMINARY  PRELIMINARY  Carotid duplex completed.    Preliminary report:  1-39% ICA plaquing.  Vertebral artery flow is antegrade.   Hamp Moreland, RVT 09/17/2016, 12:19 PM

## 2016-09-17 NOTE — Progress Notes (Signed)
PROGRESS NOTE  SHAIDA KITTELSON  [id number] DOB: [birth year] DOA: 09/16/2016 PCP: Mathews Argyle, MD   Brief Narrative: LATORY DELOERA is a 82 y.o. female with medical history significant of hypertension, BPPV, DJD, varicose veins, who presented with a slurred speech and left facial droop. She was found to have WBC 7.0, electrolytes and renal function okay, INR 1.02, troponin negative, temperature normal, mildly tachycardia, oxygen saturation 99% on room air, negative CT head for acute intracranial abnormalities. She was placed on telemetry bed observation for CVA work up. Subsequent MRI showed lacunar infarct. Symptoms have improved mildly since that time. Neurology is following.   Assessment & Plan: Principal Problem:   Facial droop-left Active Problems:   Hypertension   Benign positional vertigo   Slurred speech   Cerebral thrombosis with cerebral infarction  Acute ischemic right infarct in the centrum semiovale/corona radiata: Seen on MRI. Also with remote left parietal cortical infarct on chronic microvascular disease. - Stroke team following - ASA 325mg  (was not taking this reliably PTA - MRA negative  - Echo pending will need to remain for completion of work up.  - PT/OT/SLP valuations pending  Benign positional vertigo: - continue meclizine prn  Hypertension: - Permissive HTN, hold lisinopril - IV hydralazine when necessary  Mucus in stool: An clear etiology. Patient does not have nausea, vomiting or abdominal pain. Patient is taking psyllium which may be related. -hold psyllium and observe closely  DVT prophylaxis: Lovenox Code Status: Full Family Communication: None at bedside, declined offer to call family today. Disposition Plan: Continue CVA work up, if unrevealing, will discharge 9/22.  Consultants:   Neurology, stroke team, Dr. Leonie Man  Procedures:   Echo pending  Carotid U/S pending  Antimicrobials:  None   Subjective: Still  with some speech difficulties, particularly with "s." Choked on water earlier, but no other trouble swallowing. No other deficits/weakness/numbness.  Objective: Vitals:   09/17/16 0730 09/17/16 0924 09/17/16 1115 09/17/16 1343  BP: 132/69 (!) 141/69 132/60 (!) 132/59  Pulse: (!) 103 95 88 93  Resp: 16 16 18 18   Temp: 97.9 F (36.6 C) 98.4 F (36.9 C)  98.6 F (37 C)  TempSrc: Oral Oral  Oral  SpO2: 97% 98% 95% 98%  Weight:      Height:        Intake/Output Summary (Last 24 hours) at 09/17/16 1617 Last data filed at 09/17/16 1200  Gross per 24 hour  Intake              540 ml  Output                0 ml  Net              540 ml   Filed Weights   09/16/16 1641  Weight: 56.7 kg (125 lb)    Examination: General exam: 82 y.o. female in no distress Respiratory system: Non-labored breathing. Clear to auscultation bilaterally.  Cardiovascular system: Regular rate and rhythm. No murmur, rub, or gallop. No JVD, and no pedal edema. Gastrointestinal system: Abdomen soft, non-tender, non-distended, with normoactive bowel sounds. No organomegaly or masses felt. Central nervous system: Alert and oriented. Mild left facial droop and leftward tongue deviation. Extremities: Warm, no deformities Skin: No rashes, lesions No ulcers Psychiatry: Judgement and insight appear normal. Mood & affect appropriate.   Data Reviewed: I have personally reviewed following labs and imaging studies  CBC:  Recent Labs Lab 09/16/16 1639 09/16/16 1652  WBC 7.0  --  NEUTROABS 4.3  --   HGB 12.1 12.9  HCT 37.6 38.0  MCV 87.9  --   PLT 310  --    Basic Metabolic Panel:  Recent Labs Lab 09/16/16 1639 09/16/16 1652  NA 137 139  K 4.2 4.2  CL 104 101  CO2 27  --   GLUCOSE 119* 120*  BUN 17 17  CREATININE 0.91 0.90  CALCIUM 9.6  --    GFR: Estimated Creatinine Clearance: 35.4 mL/min (by C-G formula based on SCr of 0.9 mg/dL). Liver Function Tests:  Recent Labs Lab 09/16/16 1639  AST  22  ALT 16  ALKPHOS 51  BILITOT 0.4  PROT 7.2  ALBUMIN 4.1   No results for input(s): LIPASE, AMYLASE in the last 168 hours. No results for input(s): AMMONIA in the last 168 hours. Coagulation Profile:  Recent Labs Lab 09/16/16 1639  INR 1.02   Cardiac Enzymes: No results for input(s): CKTOTAL, CKMB, CKMBINDEX, TROPONINI in the last 168 hours. BNP (last 3 results) No results for input(s): PROBNP in the last 8760 hours. HbA1C: No results for input(s): HGBA1C in the last 72 hours. CBG:  Recent Labs Lab 09/16/16 1643  GLUCAP 118*   Lipid Profile: No results for input(s): CHOL, HDL, LDLCALC, TRIG, CHOLHDL, LDLDIRECT in the last 72 hours. Thyroid Function Tests: No results for input(s): TSH, T4TOTAL, FREET4, T3FREE, THYROIDAB in the last 72 hours. Anemia Panel: No results for input(s): VITAMINB12, FOLATE, FERRITIN, TIBC, IRON, RETICCTPCT in the last 72 hours. Urine analysis:    Component Value Date/Time   COLORURINE YELLOW 09/16/2016 2018   APPEARANCEUR CLEAR 09/16/2016 2018   LABSPEC 1.007 09/16/2016 2018   PHURINE 8.0 09/16/2016 2018   GLUCOSEU NEGATIVE 09/16/2016 2018   HGBUR NEGATIVE 09/16/2016 2018   BILIRUBINUR NEGATIVE 09/16/2016 2018   KETONESUR NEGATIVE 09/16/2016 2018   PROTEINUR NEGATIVE 09/16/2016 2018   UROBILINOGEN 0.2 10/15/2007 1211   NITRITE NEGATIVE 09/16/2016 2018   LEUKOCYTESUR SMALL (A) 09/16/2016 2018   Sepsis Labs: @LABRCNTIP (procalcitonin:4,lacticidven:4)  )No results found for this or any previous visit (from the past 240 hour(s)).   Radiology Studies: Ct Head Wo Contrast  Result Date: 09/16/2016 CLINICAL DATA:  Slurred speech, left facial droop EXAM: CT HEAD WITHOUT CONTRAST TECHNIQUE: Contiguous axial images were obtained from the base of the skull through the vertex without intravenous contrast. COMPARISON:  None. FINDINGS: Brain: No evidence of acute infarction, hemorrhage, extra-axial collection, ventriculomegaly, or mass effect.  Generalized cerebral atrophy. Periventricular white matter low attenuation likely secondary to microangiopathy. Vascular: Cerebrovascular atherosclerotic calcifications are noted. Skull: Negative for fracture or focal lesion. Sinuses/Orbits: Visualized portions of the orbits are unremarkable. Visualized portions of the paranasal sinuses and mastoid air cells are unremarkable. Other: None. IMPRESSION: 1. No acute intracranial pathology. 2. Chronic microvascular disease and cerebral atrophy. Electronically Signed   By: Kathreen Devoid   On: 09/16/2016 17:18   Mr Brain Wo Contrast  Result Date: 09/17/2016 CLINICAL DATA:  Initial evaluation for acute onset left facial weakness with slurred speech. EXAM: MRI HEAD WITHOUT CONTRAST MRA HEAD WITHOUT CONTRAST TECHNIQUE: Multiplanar, multiecho pulse sequences of the brain and surrounding structures were obtained without intravenous contrast. Angiographic images of the head were obtained using MRA technique without contrast. COMPARISON:  Prior CT from 09/16/2016. FINDINGS: MRI HEAD FINDINGS Brain: The study is degraded by motion artifact. Patchy and confluent T2/FLAIR hyperintensity within the periventricular and deep white matter both cerebral hemispheres most consistent with chronic microvascular ischemic changes, mild for age. Small remote cortical  infarct present within the parasagittal left parietal lobe. No other areas of remote infarction identified. Abnormal restricted diffusion measuring approximately 16 mm present within the central aspect of the right corona radiata/centrum semi ovale (series 3, image 30), consistent with acute ischemic infarct. No associated hemorrhage or mass effect. No other evidence for acute infarction. Gray-white matter differentiation maintained. No acute or chronic intracranial hemorrhage. No mass lesion, midline shift, or mass effect. No hydrocephalus. No extra-axial fluid collection. Major dural sinuses are grossly patent. Pituitary gland  normal. Vascular: Major intracranial vascular flow voids are maintained. Skull and upper cervical spine: Craniocervical junction normal. Visualized upper cervical spine within normal limits. Bone marrow signal intensity normal. No scalp soft tissue abnormality. Sinuses/Orbits: Globes and orbital soft tissues within normal limits. Patient status post cataract extraction bilaterally. Paranasal sinuses are clear. Trace opacity posterior right mastoid air cells. Mastoid air cells are otherwise clear. Inner ear structures grossly normal. Other: No other significant finding. MRA HEAD FINDINGS ANTERIOR CIRCULATION: Study fairly degraded by motion artifact. Distal cervical segments of the internal carotid arteries are patent with antegrade flow. Petrous, cavernous, and supraclinoid segments patent without flow-limiting stenosis. A1 segments grossly patent. Anterior cerebral arteries patent to their distal aspects. No obvious stenosis, although evaluation limited by motion. M1 segments grossly patent without occlusion or obvious stenosis. Distal MCA branches fairly symmetric, but poorly evaluated on this motion degraded study. POSTERIOR CIRCULATION: Right vertebral artery dominant and patent to the vertebrobasilar junction. Diminutive left vertebral artery patent as well. Posterior inferior cerebral arteries patent proximally. Basilar artery well opacified to its distal aspect. Superior cerebral arteries patent proximally. Left PCA arises from the basilar artery and is opacified to its distal aspect. Fetal type right PCA supplied via the right posterior communicating artery. Right PCA also supplied to its distal aspect. No obvious stenosis within the posterior circulation. No aneurysm or vascular malformation. IMPRESSION: MRI HEAD IMPRESSION: 1. 16 mm acute ischemic nonhemorrhagic infarct within the central right centrum semi ovale/corona radiata. 2. Small remote left parietal cortical infarct as above. 3. Mild chronic  microvascular ischemic disease. MRA HEAD IMPRESSION: Motion degraded study. Negative for large vessel occlusion. No obvious high-grade or correctable stenosis. Electronically Signed   By: Jeannine Boga M.D.   On: 09/17/2016 04:04   Mr Jodene Nam Head/brain [id number] Cm  Result Date: 09/17/2016 CLINICAL DATA:  Initial evaluation for acute onset left facial weakness with slurred speech. EXAM: MRI HEAD WITHOUT CONTRAST MRA HEAD WITHOUT CONTRAST TECHNIQUE: Multiplanar, multiecho pulse sequences of the brain and surrounding structures were obtained without intravenous contrast. Angiographic images of the head were obtained using MRA technique without contrast. COMPARISON:  Prior CT from 09/16/2016. FINDINGS: MRI HEAD FINDINGS Brain: The study is degraded by motion artifact. Patchy and confluent T2/FLAIR hyperintensity within the periventricular and deep white matter both cerebral hemispheres most consistent with chronic microvascular ischemic changes, mild for age. Small remote cortical infarct present within the parasagittal left parietal lobe. No other areas of remote infarction identified. Abnormal restricted diffusion measuring approximately 16 mm present within the central aspect of the right corona radiata/centrum semi ovale (series 3, image 30), consistent with acute ischemic infarct. No associated hemorrhage or mass effect. No other evidence for acute infarction. Gray-white matter differentiation maintained. No acute or chronic intracranial hemorrhage. No mass lesion, midline shift, or mass effect. No hydrocephalus. No extra-axial fluid collection. Major dural sinuses are grossly patent. Pituitary gland normal. Vascular: Major intracranial vascular flow voids are maintained. Skull and upper cervical spine: Craniocervical junction normal.  Visualized upper cervical spine within normal limits. Bone marrow signal intensity normal. No scalp soft tissue abnormality. Sinuses/Orbits: Globes and orbital soft tissues within  normal limits. Patient status post cataract extraction bilaterally. Paranasal sinuses are clear. Trace opacity posterior right mastoid air cells. Mastoid air cells are otherwise clear. Inner ear structures grossly normal. Other: No other significant finding. MRA HEAD FINDINGS ANTERIOR CIRCULATION: Study fairly degraded by motion artifact. Distal cervical segments of the internal carotid arteries are patent with antegrade flow. Petrous, cavernous, and supraclinoid segments patent without flow-limiting stenosis. A1 segments grossly patent. Anterior cerebral arteries patent to their distal aspects. No obvious stenosis, although evaluation limited by motion. M1 segments grossly patent without occlusion or obvious stenosis. Distal MCA branches fairly symmetric, but poorly evaluated on this motion degraded study. POSTERIOR CIRCULATION: Right vertebral artery dominant and patent to the vertebrobasilar junction. Diminutive left vertebral artery patent as well. Posterior inferior cerebral arteries patent proximally. Basilar artery well opacified to its distal aspect. Superior cerebral arteries patent proximally. Left PCA arises from the basilar artery and is opacified to its distal aspect. Fetal type right PCA supplied via the right posterior communicating artery. Right PCA also supplied to its distal aspect. No obvious stenosis within the posterior circulation. No aneurysm or vascular malformation. IMPRESSION: MRI HEAD IMPRESSION: 1. 16 mm acute ischemic nonhemorrhagic infarct within the central right centrum semi ovale/corona radiata. 2. Small remote left parietal cortical infarct as above. 3. Mild chronic microvascular ischemic disease. MRA HEAD IMPRESSION: Motion degraded study. Negative for large vessel occlusion. No obvious high-grade or correctable stenosis. Electronically Signed   By: Jeannine Boga M.D.   On: 09/17/2016 04:04    Scheduled Meds: . aspirin  300 mg Rectal Daily   Or  . aspirin  325 mg Oral  Daily  . calcium carbonate  1,250 mg Oral Q breakfast  . cholecalciferol  5,000 Units Oral Daily  . dextroamphetamine  5 mg Oral Daily  . enoxaparin (LOVENOX) injection  40 mg Subcutaneous QHS  . fluticasone  2 spray Each Nare Daily  . lisinopril  10 mg Oral Daily  . magnesium oxide  400 mg Oral Daily  . vitamin B-12  1,000 mcg Oral Daily   Continuous Infusions: . sodium chloride 75 mL/hr at 09/17/16 0007     LOS: 0 days   Time spent: 25 minutes.  Vance Gather, MD [facility] Pager [phone]  If 7PM-7AM, please contact night-coverage [email or web address] Password [facility] 09/17/2016, 4:17 PM

## 2016-09-18 ENCOUNTER — Inpatient Hospital Stay (HOSPITAL_COMMUNITY): Payer: Medicare Other

## 2016-09-18 DIAGNOSIS — I639 Cerebral infarction, unspecified: Secondary | ICD-10-CM

## 2016-09-18 DIAGNOSIS — I6789 Other cerebrovascular disease: Secondary | ICD-10-CM

## 2016-09-18 LAB — LIPID PANEL
CHOL/HDL RATIO: 3.3 ratio
Cholesterol: 211 mg/dL — ABNORMAL HIGH (ref 0–200)
HDL: 64 mg/dL (ref >40)
LDL CALC: 125 mg/dL — AB (ref 0–99)
Triglycerides: 108 mg/dL (ref <150)
VLDL: 22 mg/dL (ref 0–40)

## 2016-09-18 LAB — ECHOCARDIOGRAM COMPLETE
Height: 60 in
Weight: 2000 oz

## 2016-09-18 MED ORDER — ATORVASTATIN CALCIUM 10 MG PO TABS: 10.0000 mg | ORAL_TABLET | Freq: Every day | ORAL | 0 refills | Status: DC

## 2016-09-18 MED ORDER — ATORVASTATIN CALCIUM 10 MG PO TABS
10.0000 mg | ORAL_TABLET | Freq: Every day | ORAL | Status: DC
  Administered 2016-09-18: 10 mg via ORAL
  Filled 2016-09-18: qty 1

## 2016-09-18 NOTE — Progress Notes (Signed)
Physical Therapy Treatment Patient Details Name: [name] MRN: [id number] DOB: April 23, 1928 Today's Date: 09/18/2016    History of Present Illness [name] is a 82 y.o. female with medical history significant of hypertension, BPPV, DJD, varicose veins, who presents with a slurred speech and left facial droop. MRI positive for 16 mm acute ischemic nonhemorrhagic infarct in R centrum semi ovale/corona radiata.      PT Comments    Patient progressing with balance and safety this session.  Was barefoot when I walked in putting on makeup at the mirror with her son in the room. Discussed again safety and fall prevention and importance (especially in the hospital) of having something non skid on her feet.  Patient agreeable to HHPT for safety eval and encouraged discussing with PCP participating in exercise program at her church.    Follow Up Recommendations  Home health PT (safety eval)     Equipment Recommendations  None recommended by PT    Recommendations for Other Services       Precautions / Restrictions      Mobility  Bed Mobility               General bed mobility comments: up standing at sink  Transfers Overall transfer level: Modified independent                  Ambulation/Gait Ambulation/Gait assistance: Independent Ambulation Distance (Feet): 350 Feet   Gait Pattern/deviations: Step-through pattern;Decreased stride length     General Gait Details: limps due to leg length discrepency   Stairs            Wheelchair Mobility    Modified Rankin (Stroke Patients Only) Modified Rankin (Stroke Patients Only) Pre-Morbid Rankin Score: No symptoms Modified Rankin: Slight disability     Balance                               High Level Balance Comments: side stepping, backwards walking, forward tandem at rail in hallway    Cognition Arousal/Alertness: Awake/alert Behavior During Therapy: WFL for tasks  assessed/performed Overall Cognitive Status: Within Functional Limits for tasks assessed                      Exercises      General Comments        Pertinent Vitals/Pain Pain Assessment: Faces Faces Pain Scale: Hurts a little bit Pain Location: R groin and back Pain Descriptors / Indicators: Sore;Aching Pain Intervention(s): Monitored during session;Repositioned    Home Living       Type of Home: House              Prior Function            PT Goals (current goals can now be found in the care plan section) Progress towards PT goals: Progressing toward goals    Frequency    Min 4X/week      PT Plan Current plan remains appropriate    Co-evaluation             End of Session Equipment Utilized During Treatment: Gait belt Activity Tolerance: Patient tolerated treatment well Patient left: with call bell/phone within reach;in bed     Time: 0940-1004 PT Time Calculation (min) (ACUTE ONLY): 24 min  Charges:  $Gait Training: 8-22 mins $Therapeutic Activity: 8-22 mins  G CodesReginia Naas 09/18/2016, 10:13 AM  Magda Kiel, PT [phone] 09/18/2016

## 2016-09-18 NOTE — Progress Notes (Signed)
OT SCREEN Note  Patient Details Name: SOLARIS KOZLOFF MRN: [id number] DOB: 17-Sep-1928   Cancelled Treatment:    Reason Eval/Treat Not Completed: OT screened, no needs identified, will sign off. Spoke with PT Herbie [place]   OTR/L Pager: [phone] Office: [phone] .  09/18/2016, 2:21 PM

## 2016-09-18 NOTE — Discharge Instructions (Signed)
Aspirin, ASA oral tablets What is this medicine? ASPIRIN (AS pir in) is a pain reliever. It is used to treat mild pain and fever. This medicine is also used as directed by a doctor to prevent and to treat heart attacks, to prevent strokes, and to treat arthritis or inflammation. This medicine may be used for other purposes; ask your health care provider or pharmacist if you have questions. What should I tell my health care provider before I take this medicine? They need to know if you have any of these conditions: -anemia -asthma -bleeding problems -child with chickenpox, the flu, or other viral infection -diabetes -gout -if you frequently drink alcohol containing drinks -kidney disease -liver disease -low level of vitamin K -lupus -smoke tobacco -stomach ulcers or other problems -an unusual or allergic reaction to aspirin, tartrazine dye, other medicines, dyes, or preservatives -pregnant or trying to get pregnant -breast-feeding How should I use this medicine? Take this medicine by mouth with a glass of water. Follow the directions on the package or prescription label. You can take this medicine with or without food. If it upsets your stomach, take it with food. Do not take your medicine more often than directed. Talk to your pediatrician regarding the use of this medicine in children. While this drug may be prescribed for children as young as 37 years of age for selected conditions, precautions do apply. Children and teenagers should not use this medicine to treat chicken pox or flu symptoms unless directed by a doctor. Patients over 56 years old may have a stronger reaction and need a smaller dose. Overdosage: If you think you have taken too much of this medicine contact a poison control center or emergency room at once. NOTE: This medicine is only for you. Do not share this medicine with others. What if I miss a dose? If you are taking this medicine on a regular schedule and miss a  dose, take it as soon as you can. If it is almost time for your next dose, take only that dose. Do not take double or extra doses. What may interact with this medicine? Do not take this medicine with any of the following medications: -cidofovir -ketorolac -probenecid This medicine may also interact with the following medications: -alcohol -alendronate -bismuth subsalicylate -flavocoxid -herbal supplements like feverfew, garlic, ginger, ginkgo biloba, horse chestnut -medicines for diabetes or glaucoma like acetazolamide, methazolamide -medicines for gout -medicines that treat or prevent blood clots like enoxaparin, heparin, ticlopidine, warfarin -other aspirin and aspirin-like medicines -NSAIDs, medicines for pain and inflammation, like ibuprofen or naproxen -pemetrexed -sulfinpyrazone -varicella live vaccine This list may not describe all possible interactions. Give your health care provider a list of all the medicines, herbs, non-prescription drugs, or dietary supplements you use. Also tell them if you smoke, drink alcohol, or use illegal drugs. Some items may interact with your medicine. What should I watch for while using this medicine? If you are treating yourself for pain, tell your doctor or health care professional if the pain lasts more than 10 days, if it gets worse, or if there is a new or different kind of pain. Tell your doctor if you see redness or swelling. Also, check with your doctor if you have a fever that lasts for more than 3 days. Only take this medicine to prevent heart attacks or blood clotting if prescribed by your doctor or health care professional. Do not take aspirin or aspirin-like medicines with this medicine. Too much aspirin can be dangerous. Always  read the labels carefully. This medicine can irritate your stomach or cause bleeding problems. Do not smoke cigarettes or drink alcohol while taking this medicine. Do not lie down for 30 minutes after taking this  medicine to prevent irritation to your throat. If you are scheduled for any medical or dental procedure, tell your healthcare provider that you are taking this medicine. You may need to stop taking this medicine before the procedure. This medicine may be used to treat migraines. If you take migraine medicines for 10 or more days a month, your migraines may get worse. Keep a diary of headache days and medicine use. Contact your healthcare professional if your migraine attacks occur more frequently. What side effects may I notice from receiving this medicine? Side effects that you should report to your doctor or health care professional as soon as possible: -allergic reactions like skin rash, itching or hives, swelling of the face, lips, or tongue -breathing problems -changes in hearing, ringing in the ears -confusion -general ill feeling or flu-like symptoms -pain on swallowing -redness, blistering, peeling or loosening of the skin, including inside the mouth or nose -signs and symptoms of bleeding such as bloody or black, tarry stools; red or dark-brown urine; spitting up blood or brown material that looks like coffee grounds; red spots on the skin; unusual bruising or bleeding from the eye, gums, or nose -trouble passing urine or change in the amount of urine -unusually weak or tired -yellowing of the eyes or skin Side effects that usually do not require medical attention (report to your doctor or health care professional if they continue or are bothersome): -diarrhea or constipation -headache -nausea, vomiting -stomach gas, heartburn This list may not describe all possible side effects. Call your doctor for medical advice about side effects. You may report side effects to FDA at 1-800-FDA-1088. Where should I keep my medicine? Keep out of the reach of children. Store at room temperature between 15 and 30 degrees C (59 and 86 degrees F). Protect from heat and moisture. Do not use this  medicine if it has a strong vinegar smell. Throw away any unused medicine after the expiration date. NOTE: This sheet is a summary. It may not cover all possible information. If you have questions about this medicine, talk to your doctor, pharmacist, or health care provider.    2016, Elsevier/Gold Standard. (2013-08-15 11:30:31) Atorvastatin tablets What is this medicine? ATORVASTATIN (a TORE va sta tin) is known as a HMG-CoA reductase inhibitor or 'statin'. It lowers the level of cholesterol and triglycerides in the blood. This drug may also reduce the risk of heart attack, stroke, or other health problems in patients with risk factors for heart disease. Diet and lifestyle changes are often used with this drug. This medicine may be used for other purposes; ask your health care provider or pharmacist if you have questions. What should I tell my health care provider before I take this medicine? They need to know if you have any of these conditions: -frequently drink alcoholic beverages -history of stroke, TIA -kidney disease -liver disease -muscle aches or weakness -other medical condition -an unusual or allergic reaction to atorvastatin, other medicines, foods, dyes, or preservatives -pregnant or trying to get pregnant -breast-feeding How should I use this medicine? Take this medicine by mouth with a glass of water. Follow the directions on the prescription label. You can take this medicine with or without food. Take your doses at regular intervals. Do not take your medicine more often than  directed. Talk to your pediatrician regarding the use of this medicine in children. While this drug may be prescribed for children as young as 33 years old for selected conditions, precautions do apply. Overdosage: If you think you have taken too much of this medicine contact a poison control center or emergency room at once. NOTE: This medicine is only for you. Do not share this medicine with  others. What if I miss a dose? If you miss a dose, take it as soon as you can. If it is almost time for your next dose, take only that dose. Do not take double or extra doses. What may interact with this medicine? Do not take this medicine with any of the following medications: -red yeast rice -telaprevir -telithromycin -voriconazole This medicine may also interact with the following medications: -alcohol -antiviral medicines for HIV or AIDS -boceprevir -certain antibiotics like clarithromycin, erythromycin, troleandomycin -certain medicines for cholesterol like fenofibrate or gemfibrozil -cimetidine -clarithromycin -colchicine -cyclosporine -digoxin -female hormones, like estrogens or progestins and birth control pills -grapefruit juice -medicines for fungal infections like fluconazole, itraconazole, ketoconazole -niacin -rifampin -spironolactone This list may not describe all possible interactions. Give your health care provider a list of all the medicines, herbs, non-prescription drugs, or dietary supplements you use. Also tell them if you smoke, drink alcohol, or use illegal drugs. Some items may interact with your medicine. What should I watch for while using this medicine? Visit your doctor or health care professional for regular check-ups. You may need regular tests to make sure your liver is working properly. Tell your doctor or health care professional right away if you get any unexplained muscle pain, tenderness, or weakness, especially if you also have a fever and tiredness. Your doctor or health care professional may tell you to stop taking this medicine if you develop muscle problems. If your muscle problems do not go away after stopping this medicine, contact your health care professional. This drug is only part of a total heart-health program. Your doctor or a dietician can suggest a low-cholesterol and low-fat diet to help. Avoid alcohol and smoking, and keep a proper  exercise schedule. Do not use this drug if you are pregnant or breast-feeding. Serious side effects to an unborn child or to an infant are possible. Talk to your doctor or pharmacist for more information. This medicine may affect blood sugar levels. If you have diabetes, check with your doctor or health care professional before you change your diet or the dose of your diabetic medicine. If you are going to have surgery tell your health care professional that you are taking this drug. What side effects may I notice from receiving this medicine? Side effects that you should report to your doctor or health care professional as soon as possible: -allergic reactions like skin rash, itching or hives, swelling of the face, lips, or tongue -dark urine -fever -joint pain -muscle cramps, pain -redness, blistering, peeling or loosening of the skin, including inside the mouth -trouble passing urine or change in the amount of urine -unusually weak or tired -yellowing of eyes or skin Side effects that usually do not require medical attention (report to your doctor or health care professional if they continue or are bothersome): -constipation -heartburn -stomach gas, pain, upset This list may not describe all possible side effects. Call your doctor for medical advice about side effects. You may report side effects to FDA at 1-800-FDA-1088. Where should I keep my medicine? Keep out of the reach of children. Store  at room temperature between 20 to 25 degrees C (68 to 77 degrees F). Throw away any unused medicine after the expiration date. NOTE: This sheet is a summary. It may not cover all possible information. If you have questions about this medicine, talk to your doctor, pharmacist, or health care provider.    2016, Elsevier/Gold Standard. (2011-11-03 [id number])

## 2016-09-18 NOTE — Progress Notes (Signed)
STROKE TEAM PROGRESS NOTE   SUBJECTIVE (INTERVAL HISTORY) Her husband is at the bedside, along with ST. Patient with reported trouble swallowing this am. SLP to assess.   OBJECTIVE Temp:  [98.2 F (36.8 C)-98.9 F (37.2 C)] 98.4 F (36.9 C) (09/22 1323) Pulse Rate:  [76-96] 93 (09/22 1323) Cardiac Rhythm: Normal sinus rhythm (09/22 0751) Resp:  [16-20] 20 (09/22 1323) BP: (117-158)/(48-68) 140/62 (09/22 1323) SpO2:  [94 %-99 %] 99 % (09/22 1323)  CBC:   Recent Labs Lab 09/16/16 1639 09/16/16 1652  WBC 7.0  --   NEUTROABS 4.3  --   HGB 12.1 12.9  HCT 37.6 38.0  MCV 87.9  --   PLT 310  --     Basic Metabolic Panel:   Recent Labs Lab 09/16/16 1639 09/16/16 1652  NA 137 139  K 4.2 4.2  CL 104 101  CO2 27  --   GLUCOSE 119* 120*  BUN 17 17  CREATININE 0.91 0.90  CALCIUM 9.6  --     Lipid Panel:     Component Value Date/Time   CHOL 211 (H) 09/18/2016 0308   TRIG 108 09/18/2016 0308   HDL 64 09/18/2016 0308   CHOLHDL 3.3 09/18/2016 0308   VLDL 22 09/18/2016 0308   LDLCALC 125 (H) 09/18/2016 0308   HgbA1c: No results found for: HGBA1C Urine Drug Screen: No results found for: LABOPIA, COCAINSCRNUR, LABBENZ, AMPHETMU, THCU, LABBARB    IMAGING  Ct Head Wo Contrast  Result Date: 09/16/2016 CLINICAL DATA:  Slurred speech, left facial droop EXAM: CT HEAD WITHOUT CONTRAST TECHNIQUE: Contiguous axial images were obtained from the base of the skull through the vertex without intravenous contrast. COMPARISON:  None. FINDINGS: Brain: No evidence of acute infarction, hemorrhage, extra-axial collection, ventriculomegaly, or mass effect. Generalized cerebral atrophy. Periventricular white matter low attenuation likely secondary to microangiopathy. Vascular: Cerebrovascular atherosclerotic calcifications are noted. Skull: Negative for fracture or focal lesion. Sinuses/Orbits: Visualized portions of the orbits are unremarkable. Visualized portions of the paranasal sinuses and  mastoid air cells are unremarkable. Other: None. IMPRESSION: 1. No acute intracranial pathology. 2. Chronic microvascular disease and cerebral atrophy. Electronically Signed   By: Kathreen Devoid   On: 09/16/2016 17:18   Mr Brain Wo Contrast  Result Date: 09/17/2016 CLINICAL DATA:  Initial evaluation for acute onset left facial weakness with slurred speech. EXAM: MRI HEAD WITHOUT CONTRAST MRA HEAD WITHOUT CONTRAST TECHNIQUE: Multiplanar, multiecho pulse sequences of the brain and surrounding structures were obtained without intravenous contrast. Angiographic images of the head were obtained using MRA technique without contrast. COMPARISON:  Prior CT from 09/16/2016. FINDINGS: MRI HEAD FINDINGS Brain: The study is degraded by motion artifact. Patchy and confluent T2/FLAIR hyperintensity within the periventricular and deep white matter both cerebral hemispheres most consistent with chronic microvascular ischemic changes, mild for age. Small remote cortical infarct present within the parasagittal left parietal lobe. No other areas of remote infarction identified. Abnormal restricted diffusion measuring approximately 16 mm present within the central aspect of the right corona radiata/centrum semi ovale (series 3, image 30), consistent with acute ischemic infarct. No associated hemorrhage or mass effect. No other evidence for acute infarction. Gray-white matter differentiation maintained. No acute or chronic intracranial hemorrhage. No mass lesion, midline shift, or mass effect. No hydrocephalus. No extra-axial fluid collection. Major dural sinuses are grossly patent. Pituitary gland normal. Vascular: Major intracranial vascular flow voids are maintained. Skull and upper cervical spine: Craniocervical junction normal. Visualized upper cervical spine within normal limits. Bone marrow  signal intensity normal. No scalp soft tissue abnormality. Sinuses/Orbits: Globes and orbital soft tissues within normal limits. Patient  status post cataract extraction bilaterally. Paranasal sinuses are clear. Trace opacity posterior right mastoid air cells. Mastoid air cells are otherwise clear. Inner ear structures grossly normal. Other: No other significant finding. MRA HEAD FINDINGS ANTERIOR CIRCULATION: Study fairly degraded by motion artifact. Distal cervical segments of the internal carotid arteries are patent with antegrade flow. Petrous, cavernous, and supraclinoid segments patent without flow-limiting stenosis. A1 segments grossly patent. Anterior cerebral arteries patent to their distal aspects. No obvious stenosis, although evaluation limited by motion. M1 segments grossly patent without occlusion or obvious stenosis. Distal MCA branches fairly symmetric, but poorly evaluated on this motion degraded study. POSTERIOR CIRCULATION: Right vertebral artery dominant and patent to the vertebrobasilar junction. Diminutive left vertebral artery patent as well. Posterior inferior cerebral arteries patent proximally. Basilar artery well opacified to its distal aspect. Superior cerebral arteries patent proximally. Left PCA arises from the basilar artery and is opacified to its distal aspect. Fetal type right PCA supplied via the right posterior communicating artery. Right PCA also supplied to its distal aspect. No obvious stenosis within the posterior circulation. No aneurysm or vascular malformation. IMPRESSION: MRI HEAD IMPRESSION: 1. 16 mm acute ischemic nonhemorrhagic infarct within the central right centrum semi ovale/corona radiata. 2. Small remote left parietal cortical infarct as above. 3. Mild chronic microvascular ischemic disease. MRA HEAD IMPRESSION: Motion degraded study. Negative for large vessel occlusion. No obvious high-grade or correctable stenosis. Electronically Signed   By: Jeannine Boga M.D.   On: 09/17/2016 04:04   Mr Jodene Nam Head/brain [id number] Cm  Result Date: 09/17/2016 CLINICAL DATA:  Initial evaluation for acute onset  left facial weakness with slurred speech. EXAM: MRI HEAD WITHOUT CONTRAST MRA HEAD WITHOUT CONTRAST TECHNIQUE: Multiplanar, multiecho pulse sequences of the brain and surrounding structures were obtained without intravenous contrast. Angiographic images of the head were obtained using MRA technique without contrast. COMPARISON:  Prior CT from 09/16/2016. FINDINGS: MRI HEAD FINDINGS Brain: The study is degraded by motion artifact. Patchy and confluent T2/FLAIR hyperintensity within the periventricular and deep white matter both cerebral hemispheres most consistent with chronic microvascular ischemic changes, mild for age. Small remote cortical infarct present within the parasagittal left parietal lobe. No other areas of remote infarction identified. Abnormal restricted diffusion measuring approximately 16 mm present within the central aspect of the right corona radiata/centrum semi ovale (series 3, image 30), consistent with acute ischemic infarct. No associated hemorrhage or mass effect. No other evidence for acute infarction. Gray-white matter differentiation maintained. No acute or chronic intracranial hemorrhage. No mass lesion, midline shift, or mass effect. No hydrocephalus. No extra-axial fluid collection. Major dural sinuses are grossly patent. Pituitary gland normal. Vascular: Major intracranial vascular flow voids are maintained. Skull and upper cervical spine: Craniocervical junction normal. Visualized upper cervical spine within normal limits. Bone marrow signal intensity normal. No scalp soft tissue abnormality. Sinuses/Orbits: Globes and orbital soft tissues within normal limits. Patient status post cataract extraction bilaterally. Paranasal sinuses are clear. Trace opacity posterior right mastoid air cells. Mastoid air cells are otherwise clear. Inner ear structures grossly normal. Other: No other significant finding. MRA HEAD FINDINGS ANTERIOR CIRCULATION: Study fairly degraded by motion artifact.  Distal cervical segments of the internal carotid arteries are patent with antegrade flow. Petrous, cavernous, and supraclinoid segments patent without flow-limiting stenosis. A1 segments grossly patent. Anterior cerebral arteries patent to their distal aspects. No obvious stenosis, although evaluation limited by motion.  M1 segments grossly patent without occlusion or obvious stenosis. Distal MCA branches fairly symmetric, but poorly evaluated on this motion degraded study. POSTERIOR CIRCULATION: Right vertebral artery dominant and patent to the vertebrobasilar junction. Diminutive left vertebral artery patent as well. Posterior inferior cerebral arteries patent proximally. Basilar artery well opacified to its distal aspect. Superior cerebral arteries patent proximally. Left PCA arises from the basilar artery and is opacified to its distal aspect. Fetal type right PCA supplied via the right posterior communicating artery. Right PCA also supplied to its distal aspect. No obvious stenosis within the posterior circulation. No aneurysm or vascular malformation. IMPRESSION: MRI HEAD IMPRESSION: 1. 16 mm acute ischemic nonhemorrhagic infarct within the central right centrum semi ovale/corona radiata. 2. Small remote left parietal cortical infarct as above. 3. Mild chronic microvascular ischemic disease. MRA HEAD IMPRESSION: Motion degraded study. Negative for large vessel occlusion. No obvious high-grade or correctable stenosis. Electronically Signed   By: Jeannine Boga M.D.   On: 09/17/2016 04:04   Carotid Doppler   There is 1-39% bilateral ICA stenosis. Vertebral artery flow is antegrade.   2D Echocardiogram  pending    PHYSICAL EXAM Pleasant elderly lady not in distress. . Afebrile. Head is nontraumatic. Neck is supple without bruit.    Cardiac exam no murmur or gallop. Lungs are clear to auscultation. Distal pulses are well felt.  Neurological Exam ;   Awake  Alert oriented x 3. Normal speech and  language.eye movements full without nystagmus.fundi were not visualized. Vision acuity and fields appear normal. Hearing is normal. Palatal movements are normal. Face symmetric. Tongue midline. Normal strength, tone, reflexes and coordination. Normal sensation. Gait deferred.   ASSESSMENT/PLAN [name] is a 82 y.o. female with history of hypertension, osteoporosis, degenerative joint disease and benign positional vertigo presenting with left facial weakness and slurred speech. She did not receive IV t-PA due to Beyond time window for treatment consideration.   Stroke:  Right centrum semiovale/corona radiata lacunar infarct secondary to small vessel disease   MRI  right centrum semiovale/corona radiata lacunar infarct  MRA  No large vessel occlusion/stenosis  Carotid Doppler  No significant ICA stenosis  2D Echo  pending  LDL 125  HgbA1c pending  Lovenox 40 mg sq daily for VTE prophylaxis Diet Heart Room service appropriate? Yes; Fluid consistency: Thin  aspirin 81 mg daily prior to admission but not taking it routinely, now on aspirin 325 mg daily. Continue aspirin at discharge  Patient counseled to be compliant with her antithrombotic medications  Ongoing aggressive stroke risk factor management  Therapy recommendations:  HH PT  Disposition:  pending   Ok for discharge home from stroke standpoint once 2D completed  Follow up with Dr. Leonie Man, stroke clinic, in 8 weeks. Order written.  Hypertension  Stable Permissive hypertension (OK if < 220/120) but gradually normalize in 5-7 days Long-term BP goal normotensive  Hyperlipidemia  LDL 125, on no statin PTA, now on lipitor 10 mg daily, goal LDL < 70  Other Stroke Risk Factors  Advanced age  Other Active Problems  BPPV  Mucus in stool  Hospital day # 1  Personally examined patient and images, and have participated in and made any corrections needed to history, physical, neuro exam,assessment and  plan as stated above.  I have personally obtained the history, evaluated lab date, reviewed imaging studies and agree with radiology interpretations.    Sarina Ill, MD Stroke Neurology [phone] [facility]  A total of 35 minutes was spent  face-to-face with this patient. Over half this time was spent on counseling patient on the diagnosis and different diagnostic and therapeutic options available.       To contact Stroke Continuity provider, please refer to [email or web address] After hours, contact General Neurology

## 2016-09-18 NOTE — Discharge Summary (Signed)
Physician Discharge Summary  [name] [id number] DOB: Dec 17, 1928 DOA: 09/16/2016  PCP: Mathews Argyle, MD  Admit date: 09/16/2016 Discharge date: 09/18/2016  Recommendations for Outpatient Follow-up:  Continue aspirin 325 mg daily and atorvastatin 10 mg at bedtime  Discharge Diagnoses:  Principal Problem:   Facial droop-left Active Problems:   Hypertension   Benign positional vertigo   Slurred speech   Cerebral thrombosis with cerebral infarction   Acute CVA (cerebrovascular accident) [facility])    Discharge Condition: stable   Diet recommendation: as tolerated   History of present illness:  Per brief narrative 09/17/2016 "82 y.o.femalewith medical history significant of hypertension, BPPV, DJD, varicose veins, who presented with a slurred speech and left facial droop. She was found to have WBC 7.0, electrolytes and renal function okay, INR 1.02, troponin negative, temperature normal, mildly tachycardia, oxygen saturation 99% on room air, negative CT head for acute intracranial abnormalities. She was placed on telemetry bed observation for CVA work up. Subsequent MRI showed lacunar infarct. Symptoms have improved mildly since that time. Neurology is following."  Hospital Course:   Acute ischemic right infarct in the centrum semiovale/corona radiata - Seen on MRI. Also with remote left parietal cortical infarct on chronic microvascular disease - MRA negative  - Stroke team following; plan to continue aspirin and we started low dose atorvastatin 10 mg at bedtime . Her LDL Is 125 - Echo done  - PT/OT - Home health orders placed   Benign positional vertigo: - Stable - Meclizine PRN  Hypertension: - BP 148/68 - Resume home meds on discharge   Mucus in stool - Improved   DVT prophylaxis: Lovenox subQ in hospital  Code Status: Full Family Communication: No family at the bedside   Consultants:   Neurology, stroke team, Dr. Leonie Man  Procedures:    Echo pending  Carotid U/S pending  Antimicrobials:  None    Signed:  Leisa Lenz, MD  [facility] 09/18/2016, 6:26 PM  Pager #: [phone]  Time spent in minutes: less than 30 minutes    Discharge Exam: Vitals:   09/18/16 1323 09/18/16 1748  BP: 140/62 (!) 141/62  Pulse: 93 95  Resp: 20 18  Temp: 98.4 F (36.9 C) 98.6 F (37 C)   Vitals:   09/18/16 0533 09/18/16 0907 09/18/16 1323 09/18/16 1748  BP: (!) 117/48 (!) 148/68 140/62 (!) 141/62  Pulse: 76 96 93 95  Resp: 16 18 20 18   Temp: 98.9 F (37.2 C) 98.2 F (36.8 C) 98.4 F (36.9 C) 98.6 F (37 C)  TempSrc: Oral Oral Oral Oral  SpO2: 97% 96% 99% 97%  Weight:      Height:        General: Pt is alert, follows commands appropriately, not in acute distress Cardiovascular: Regular rate and rhythm, S1/S2 + Respiratory: Clear to auscultation bilaterally, no wheezing, no crackles, no rhonchi Abdominal: Soft, non tender, non distended, bowel sounds +, no guarding Extremities: no edema, no cyanosis, pulses palpable bilaterally DP and PT Neuro: Grossly nonfocal  Discharge Instructions  Discharge Instructions    Ambulatory referral to Neurology    Complete by:  As directed    An appointment is requested in approximately: 8 weeks   Call MD for:  difficulty breathing, headache or visual disturbances    Complete by:  As directed    Call MD for:  persistant nausea and vomiting    Complete by:  As directed    Call MD for:  redness, tenderness, or signs of infection (  pain, swelling, redness, odor or green/yellow discharge around incision site)    Complete by:  As directed    Call MD for:  severe uncontrolled pain    Complete by:  As directed    Diet - low sodium heart healthy    Complete by:  As directed    Discharge instructions    Complete by:  As directed    Continue aspirin 325 mg daily and atorvastatin 10 mg at bedtime   Increase activity slowly    Complete by:  As directed         Medication List    STOP taking these medications   aspirin EC 81 MG tablet   oxyCODONE-acetaminophen 5-325 MG tablet Commonly known as:  PERCOCET/ROXICET     TAKE these medications   atorvastatin 10 MG tablet Commonly known as:  LIPITOR Take 1 tablet (10 mg total) by mouth daily at 6 PM.   calcium gluconate 500 MG tablet Take 500 mg by mouth daily.   Cranberry Extract 250 MG Tabs Take 1 tablet by mouth daily.   dextroamphetamine 5 MG tablet Commonly known as:  DEXTROSTAT Take 5 mg by mouth daily.   fluticasone 50 MCG/ACT nasal spray Commonly known as:  FLONASE Place 2 sprays into both nostrils daily.   ibuprofen 200 MG tablet Commonly known as:  ADVIL,MOTRIN Take 200 mg by mouth every 6 (six) hours as needed for headache or moderate pain.   lisinopril 10 MG tablet Commonly known as:  PRINIVIL,ZESTRIL Take 10 mg by mouth daily.   loratadine 10 MG tablet Commonly known as:  CLARITIN Take 10 mg by mouth daily as needed for allergies or itching.   Lutein 6 MG Caps Take 6 mg by mouth daily.   Magnesium 300 MG Caps Take 1 capsule by mouth daily.   meclizine 25 MG tablet Commonly known as:  ANTIVERT Take 25 mg by mouth 2 (two) times daily as needed for dizziness.   METAMUCIL FIBER PO Take 1 scoop by mouth daily as needed (constipation).   triamcinolone cream 0.5 % Commonly known as:  KENALOG Apply 1 application topically 2 (two) times daily as needed (rash/ eczema).   vitamin B-12 1000 MCG tablet Commonly known as:  CYANOCOBALAMIN Take 1,000 mcg by mouth daily.   Vitamin D 2000 units Caps Take 5,000 capsules by mouth daily.      Follow-up Information    SETHI,PRAMOD, MD Follow up in 2 month(s).   Specialties:  Neurology, Radiology Why:  stroke clinic. office to call with appt date/time Contact information: [address] [place] [address] [phone]        Mathews Argyle, MD. Schedule an appointment as soon as possible  for a visit in 1 week(s).   Specialty:  Internal Medicine Contact information: [address] [place] [address] [phone]            The results of significant diagnostics from this hospitalization (including imaging, microbiology, ancillary and laboratory) are listed below for reference.    Significant Diagnostic Studies: Ct Head Wo Contrast  Result Date: 09/16/2016 CLINICAL DATA:  Slurred speech, left facial droop EXAM: CT HEAD WITHOUT CONTRAST TECHNIQUE: Contiguous axial images were obtained from the base of the skull through the vertex without intravenous contrast. COMPARISON:  None. FINDINGS: Brain: No evidence of acute infarction, hemorrhage, extra-axial collection, ventriculomegaly, or mass effect. Generalized cerebral atrophy. Periventricular white matter low attenuation likely secondary to microangiopathy. Vascular: Cerebrovascular atherosclerotic calcifications are noted. Skull:  Negative for fracture or focal lesion. Sinuses/Orbits: Visualized portions of the orbits are unremarkable. Visualized portions of the paranasal sinuses and mastoid air cells are unremarkable. Other: None. IMPRESSION: 1. No acute intracranial pathology. 2. Chronic microvascular disease and cerebral atrophy. Electronically Signed   By: Kathreen Devoid   On: 09/16/2016 17:18   Mr Brain Wo Contrast  Result Date: 09/17/2016 CLINICAL DATA:  Initial evaluation for acute onset left facial weakness with slurred speech. EXAM: MRI HEAD WITHOUT CONTRAST MRA HEAD WITHOUT CONTRAST TECHNIQUE: Multiplanar, multiecho pulse sequences of the brain and surrounding structures were obtained without intravenous contrast. Angiographic images of the head were obtained using MRA technique without contrast. COMPARISON:  Prior CT from 09/16/2016. FINDINGS: MRI HEAD FINDINGS Brain: The study is degraded by motion artifact. Patchy and confluent T2/FLAIR hyperintensity within the periventricular and deep white matter both  cerebral hemispheres most consistent with chronic microvascular ischemic changes, mild for age. Small remote cortical infarct present within the parasagittal left parietal lobe. No other areas of remote infarction identified. Abnormal restricted diffusion measuring approximately 16 mm present within the central aspect of the right corona radiata/centrum semi ovale (series 3, image 30), consistent with acute ischemic infarct. No associated hemorrhage or mass effect. No other evidence for acute infarction. Gray-white matter differentiation maintained. No acute or chronic intracranial hemorrhage. No mass lesion, midline shift, or mass effect. No hydrocephalus. No extra-axial fluid collection. Major dural sinuses are grossly patent. Pituitary gland normal. Vascular: Major intracranial vascular flow voids are maintained. Skull and upper cervical spine: Craniocervical junction normal. Visualized upper cervical spine within normal limits. Bone marrow signal intensity normal. No scalp soft tissue abnormality. Sinuses/Orbits: Globes and orbital soft tissues within normal limits. Patient status post cataract extraction bilaterally. Paranasal sinuses are clear. Trace opacity posterior right mastoid air cells. Mastoid air cells are otherwise clear. Inner ear structures grossly normal. Other: No other significant finding. MRA HEAD FINDINGS ANTERIOR CIRCULATION: Study fairly degraded by motion artifact. Distal cervical segments of the internal carotid arteries are patent with antegrade flow. Petrous, cavernous, and supraclinoid segments patent without flow-limiting stenosis. A1 segments grossly patent. Anterior cerebral arteries patent to their distal aspects. No obvious stenosis, although evaluation limited by motion. M1 segments grossly patent without occlusion or obvious stenosis. Distal MCA branches fairly symmetric, but poorly evaluated on this motion degraded study. POSTERIOR CIRCULATION: Right vertebral artery dominant and  patent to the vertebrobasilar junction. Diminutive left vertebral artery patent as well. Posterior inferior cerebral arteries patent proximally. Basilar artery well opacified to its distal aspect. Superior cerebral arteries patent proximally. Left PCA arises from the basilar artery and is opacified to its distal aspect. Fetal type right PCA supplied via the right posterior communicating artery. Right PCA also supplied to its distal aspect. No obvious stenosis within the posterior circulation. No aneurysm or vascular malformation. IMPRESSION: MRI HEAD IMPRESSION: 1. 16 mm acute ischemic nonhemorrhagic infarct within the central right centrum semi ovale/corona radiata. 2. Small remote left parietal cortical infarct as above. 3. Mild chronic microvascular ischemic disease. MRA HEAD IMPRESSION: Motion degraded study. Negative for large vessel occlusion. No obvious high-grade or correctable stenosis. Electronically Signed   By: Jeannine Boga M.D.   On: 09/17/2016 04:04   Mr Jodene Nam Head/brain [id number] Cm  Result Date: 09/17/2016 CLINICAL DATA:  Initial evaluation for acute onset left facial weakness with slurred speech. EXAM: MRI HEAD WITHOUT CONTRAST MRA HEAD WITHOUT CONTRAST TECHNIQUE: Multiplanar, multiecho pulse sequences of the brain and surrounding structures were obtained without intravenous contrast. Angiographic  images of the head were obtained using MRA technique without contrast. COMPARISON:  Prior CT from 09/16/2016. FINDINGS: MRI HEAD FINDINGS Brain: The study is degraded by motion artifact. Patchy and confluent T2/FLAIR hyperintensity within the periventricular and deep white matter both cerebral hemispheres most consistent with chronic microvascular ischemic changes, mild for age. Small remote cortical infarct present within the parasagittal left parietal lobe. No other areas of remote infarction identified. Abnormal restricted diffusion measuring approximately 16 mm present within the central aspect of  the right corona radiata/centrum semi ovale (series 3, image 30), consistent with acute ischemic infarct. No associated hemorrhage or mass effect. No other evidence for acute infarction. Gray-white matter differentiation maintained. No acute or chronic intracranial hemorrhage. No mass lesion, midline shift, or mass effect. No hydrocephalus. No extra-axial fluid collection. Major dural sinuses are grossly patent. Pituitary gland normal. Vascular: Major intracranial vascular flow voids are maintained. Skull and upper cervical spine: Craniocervical junction normal. Visualized upper cervical spine within normal limits. Bone marrow signal intensity normal. No scalp soft tissue abnormality. Sinuses/Orbits: Globes and orbital soft tissues within normal limits. Patient status post cataract extraction bilaterally. Paranasal sinuses are clear. Trace opacity posterior right mastoid air cells. Mastoid air cells are otherwise clear. Inner ear structures grossly normal. Other: No other significant finding. MRA HEAD FINDINGS ANTERIOR CIRCULATION: Study fairly degraded by motion artifact. Distal cervical segments of the internal carotid arteries are patent with antegrade flow. Petrous, cavernous, and supraclinoid segments patent without flow-limiting stenosis. A1 segments grossly patent. Anterior cerebral arteries patent to their distal aspects. No obvious stenosis, although evaluation limited by motion. M1 segments grossly patent without occlusion or obvious stenosis. Distal MCA branches fairly symmetric, but poorly evaluated on this motion degraded study. POSTERIOR CIRCULATION: Right vertebral artery dominant and patent to the vertebrobasilar junction. Diminutive left vertebral artery patent as well. Posterior inferior cerebral arteries patent proximally. Basilar artery well opacified to its distal aspect. Superior cerebral arteries patent proximally. Left PCA arises from the basilar artery and is opacified to its distal aspect.  Fetal type right PCA supplied via the right posterior communicating artery. Right PCA also supplied to its distal aspect. No obvious stenosis within the posterior circulation. No aneurysm or vascular malformation. IMPRESSION: MRI HEAD IMPRESSION: 1. 16 mm acute ischemic nonhemorrhagic infarct within the central right centrum semi ovale/corona radiata. 2. Small remote left parietal cortical infarct as above. 3. Mild chronic microvascular ischemic disease. MRA HEAD IMPRESSION: Motion degraded study. Negative for large vessel occlusion. No obvious high-grade or correctable stenosis. Electronically Signed   By: Jeannine Boga M.D.   On: 09/17/2016 04:04   Dg Hip Unilat With Pelvis 2-3 Views Right  Result Date: 08/24/2016 CLINICAL DATA:  Right anterior hip pain.  No known injury. EXAM: DG HIP (WITH OR WITHOUT PELVIS) 2-3V RIGHT COMPARISON:  CT 06/19/2015. FINDINGS: Degenerative changes right hip. No evidence of fracture or dislocation. Pelvic calcifications consistent phleboliths. IMPRESSION: Degenerative changes right hip.  No acute abnormality. Electronically Signed   By: Marcello Moores  Register   On: 08/24/2016 12:15    Microbiology: No results found for this or any previous visit (from the past 240 hour(s)).   Labs: Basic Metabolic Panel:  Recent Labs Lab 09/16/16 1639 09/16/16 1652  NA 137 139  K 4.2 4.2  CL 104 101  CO2 27  --   GLUCOSE 119* 120*  BUN 17 17  CREATININE 0.91 0.90  CALCIUM 9.6  --    Liver Function Tests:  Recent Labs Lab 09/16/16 1639  AST 22  ALT 16  ALKPHOS 51  BILITOT 0.4  PROT 7.2  ALBUMIN 4.1   No results for input(s): LIPASE, AMYLASE in the last 168 hours. No results for input(s): AMMONIA in the last 168 hours. CBC:  Recent Labs Lab 09/16/16 1639 09/16/16 1652  WBC 7.0  --   NEUTROABS 4.3  --   HGB 12.1 12.9  HCT 37.6 38.0  MCV 87.9  --   PLT 310  --    Cardiac Enzymes: No results for input(s): CKTOTAL, CKMB, CKMBINDEX, TROPONINI in the last  168 hours. BNP: BNP (last 3 results) No results for input(s): BNP in the last 8760 hours.  ProBNP (last 3 results) No results for input(s): PROBNP in the last 8760 hours.  CBG:  Recent Labs Lab 09/16/16 1643  GLUCAP 118*

## 2016-09-18 NOTE — Evaluation (Signed)
Speech Language Pathology Evaluation Patient Details Name: [name] MRN: [id number] DOB: 1928/09/03 Today's Date: 09/18/2016 Time: [id number] SLP Time Calculation (min) (ACUTE ONLY): 17 min  Problem List:  Patient Active Problem List   Diagnosis Date Noted  . Cerebral thrombosis with cerebral infarction 09/17/2016  . Acute CVA (cerebrovascular accident) ([place]) 09/17/2016  . CVA (cerebral infarction) 09/16/2016  . Facial droop-left 09/16/2016  . Slurred speech 09/16/2016  . Hypertension   . Benign positional vertigo   . Essential hypertension   . Varicose veins of lower extremities with other complications [id number]  . Pain in limb 01/06/2012   Past Medical History:  Past Medical History:  Diagnosis Date  . Benign positional vertigo   . Bursitis started 10-2011   left leg and left hip  . DJD (degenerative joint disease)   . Hypertension   . Osteoporosis   . Varicose veins    Past Surgical History:  Past Surgical History:  Procedure Laterality Date  . ABDOMINAL HYSTERECTOMY    . APPENDECTOMY    . CATARACT EXTRACTION     right  . HERNIA REPAIR    . KYPHOSIS SURGERY    . TUBAL LIGATION     bilateral   HPI:  [name] is a 82 y.o. female with medical history significant of hypertension, BPPV, DJD, varicose veins, who presents with a slurred speech and left facial droop. MRI positive for 16 mm acute ischemic nonhemorrhagic infarct in R centrum semi ovale/corona radiata.   Assessment / Plan / Recommendation Clinical Impression  Pt scored in average range for subtests of the Cognistat. Pt lives alone and is responsible for financial management, medications etc. 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 SLP educated and demonstrated strategies for increased phonatory precision. Pt complained of difficulty swallowing; MD present and ordered BSE which will be  completed later today. SLP will provide pt handout re: speech strategies and answer questions.       SLP Assessment  Patient does not need any further Speech Lanaguage Pathology Services    Follow Up Recommendations  None    Frequency and Duration           SLP Evaluation Cognition  Overall Cognitive Status: Within Functional Limits for tasks assessed Arousal/Alertness: Awake/alert Orientation Level: Oriented X4 Attention: Sustained Sustained Attention: Appears intact Memory: Appears intact Awareness: Appears intact Problem Solving: Appears intact Safety/Judgment: Appears intact       Comprehension  Auditory Comprehension Overall Auditory Comprehension: Appears within functional limits for tasks assessed Visual Recognition/Discrimination Discrimination: Not tested Reading Comprehension Reading Status: Not tested    Expression Expression Primary Mode of Expression: Verbal Verbal Expression Overall Verbal Expression: Appears within functional limits for tasks assessed Pragmatics: No impairment Written Expression Dominant Hand: Right Written Expression: Not tested   Oral / Motor  Oral Motor/Sensory Function Overall Oral Motor/Sensory Function: Mild impairment Facial ROM: Reduced left;Suspected CN VII (facial) dysfunction Facial Symmetry: Abnormal symmetry left;Suspected CN VII (facial) dysfunction Facial Strength: Reduced left;Suspected CN VII (facial) dysfunction Facial Sensation: Within Functional Limits (per pt) Lingual ROM: Within Functional Limits Lingual Symmetry: Abnormal symmetry left;Suspected CN XII (hypoglossal) dysfunction Lingual Strength: Reduced;Suspected CN XII (hypoglossal) dysfunction Velum: Within Functional Limits Mandible: Within Functional Limits Motor Speech Overall Motor Speech: Impaired Respiration: Within functional limits Phonation: Normal Resonance: Within functional limits Articulation: Within functional limitis Intelligibility:  Intelligible (mild fricative distortions) Motor Planning: Witnin functional limits  GO                    Houston Siren 09/18/2016, 10:03 AM  Orbie Pyo Colvin Caroli.[employer] [phone]

## 2016-09-18 NOTE — Care Management Note (Signed)
Case Management Note  Patient Details  Name: [name] MRN: [id number] Date of Birth: 11/10/1928  Subjective/Objective:                    Action/Plan: Plan is for patient to discharge home with [facility] services. CM met with the patient and her son and provided them a list of [facility] agencies in the [place] area. They selected [facility]. Karolee Stamps with [facility] notified and accepted the referral.   Expected Discharge Date:                  Expected Discharge Plan:  [place]  In-House Referral:     Discharge planning Services  CM Consult  Post Acute Care Choice:  Home Health Choice offered to:  Patient  DME Arranged:    DME Agency:     HH Arranged:  PT, Speech Therapy HH Agency:  [place]  Status of Service:  Completed, signed off  If discussed at [place] of Stay Meetings, dates discussed:    Additional Comments:  Pollie Friar, RN 09/18/2016, 2:36 PM

## 2016-09-18 NOTE — Progress Notes (Signed)
Patient is discharged from room 5C16 at this time. Alert and in stable condition. IV site d/c'd as well as tele. Instructions read to patient and family with understanding verbalized. Left unit via wheelchair with all belongings at side.

## 2016-09-18 NOTE — Progress Notes (Signed)
  Echocardiogram 2D Echocardiogram has been performed.  Donata Clay 09/18/2016, 3:47 PM

## 2016-09-18 NOTE — Evaluation (Signed)
Clinical/Bedside Swallow Evaluation Patient Details  Name: [name] MRN: [id number] Date of Birth: 1928-08-16  Today's Date: 09/18/2016 Time: SLP Start Time (ACUTE ONLY): 1143 SLP Stop Time (ACUTE ONLY): 1200 SLP Time Calculation (min) (ACUTE ONLY): 17 min  Past Medical History:  Past Medical History:  Diagnosis Date  . Benign positional vertigo   . Bursitis started 10-2011   left leg and left hip  . DJD (degenerative joint disease)   . Hypertension   . Osteoporosis   . Varicose veins    Past Surgical History:  Past Surgical History:  Procedure Laterality Date  . ABDOMINAL HYSTERECTOMY    . APPENDECTOMY    . CATARACT EXTRACTION     right  . HERNIA REPAIR    . KYPHOSIS SURGERY    . TUBAL LIGATION     bilateral   HPI:  [name] is a 82 y.o. female with medical history significant of hypertension, BPPV, DJD, varicose veins, who presents with a slurred speech and left facial droop. MRI positive for 16 mm acute ischemic nonhemorrhagic infarct in R centrum semi ovale/corona radiata.   Assessment / Plan / Recommendation Clinical Impression  Pt states "it feels different when I swallow food" (during the swallow). Cough x 1 following solid texture. Suspect possible pharyngeal residue with education provided to swallow twice after solids and alternate liquids and solids. No additional s/s aspiration with solids or thin. Recommend she continue regular texture and thin liquids, swallow twice if needed, alternate liquids/solids, pills with water and no vocalizing immediate after swallowing. No further ST needed.    Aspiration Risk  Mild aspiration risk    Diet Recommendation Regular;Thin liquid   Liquid Administration via: Cup;Straw Medication Administration: Whole meds with liquid Supervision: Patient able to self feed Compensations: Slow rate;Small sips/bites Postural Changes: Seated upright at 90 degrees    Other  Recommendations Oral Care  Recommendations: Oral care BID   Follow up Recommendations None      Frequency and Duration            Prognosis        Swallow Study   General HPI: [name] is a 82 y.o. female with medical history significant of hypertension, BPPV, DJD, varicose veins, who presents with a slurred speech and left facial droop. MRI positive for 16 mm acute ischemic nonhemorrhagic infarct in R centrum semi ovale/corona radiata. Type of Study: Bedside Swallow Evaluation Diet Prior to this Study: Regular;Thin liquids Temperature Spikes Noted: No Respiratory Status: Room air History of Recent Intubation: No Behavior/Cognition: Alert;Cooperative;Pleasant mood Oral Cavity Assessment: Within Functional Limits Oral Care Completed by SLP: Yes Oral Cavity - Dentition:  (missing posterior lower-functional) Vision: Functional for self-feeding Self-Feeding Abilities: Able to feed self Patient Positioning: Upright in bed Baseline Vocal Quality: Normal Volitional Cough: Strong Volitional Swallow: Able to elicit    Oral/Motor/Sensory Function Overall Oral Motor/Sensory Function: Mild impairment Facial ROM: Reduced left;Suspected CN VII (facial) dysfunction Facial Symmetry: Abnormal symmetry left;Suspected CN VII (facial) dysfunction Facial Strength: Reduced left;Suspected CN VII (facial) dysfunction Facial Sensation: Within Functional Limits Lingual ROM: Within Functional Limits Lingual Symmetry: Abnormal symmetry left;Suspected CN XII (hypoglossal) dysfunction Lingual Strength: Reduced;Suspected CN XII (hypoglossal) dysfunction Velum: Within Functional Limits Mandible: Within Functional Limits   Ice Chips Ice chips: Not tested   Thin Liquid Thin Liquid: Within functional limits Presentation: Straw    Nectar Thick Nectar Thick Liquid: Not tested   Honey Thick Honey Thick Liquid: Not tested   Puree Puree: Within  functional limits   Solid   GO   Solid: Within functional limits         Mick Sell, Orbie Pyo 09/18/2016,1:35 PM  Orbie Pyo [place].[employer] [phone]

## 2016-09-19 LAB — HEMOGLOBIN A1C
HEMOGLOBIN A1C: 5.7 % — AB (ref 4.8–5.6)
Mean Plasma Glucose: 117 mg/dL

## 2016-09-20 DIAGNOSIS — R29810 Facial weakness: Secondary | ICD-10-CM | POA: Diagnosis not present

## 2016-09-20 DIAGNOSIS — H8110 Benign paroxysmal vertigo, unspecified ear: Secondary | ICD-10-CM | POA: Diagnosis not present

## 2016-09-20 DIAGNOSIS — I69398 Other sequelae of cerebral infarction: Secondary | ICD-10-CM | POA: Diagnosis not present

## 2016-09-20 DIAGNOSIS — I69322 Dysarthria following cerebral infarction: Secondary | ICD-10-CM | POA: Diagnosis not present

## 2016-09-20 DIAGNOSIS — I10 Essential (primary) hypertension: Secondary | ICD-10-CM | POA: Diagnosis not present

## 2016-09-20 DIAGNOSIS — R531 Weakness: Secondary | ICD-10-CM | POA: Diagnosis not present

## 2016-09-23 DIAGNOSIS — I10 Essential (primary) hypertension: Secondary | ICD-10-CM | POA: Diagnosis not present

## 2016-09-23 DIAGNOSIS — I6350 Cerebral infarction due to unspecified occlusion or stenosis of unspecified cerebral artery: Secondary | ICD-10-CM | POA: Diagnosis not present

## 2016-09-24 DIAGNOSIS — I69322 Dysarthria following cerebral infarction: Secondary | ICD-10-CM | POA: Diagnosis not present

## 2016-09-24 DIAGNOSIS — R531 Weakness: Secondary | ICD-10-CM | POA: Diagnosis not present

## 2016-09-28 DIAGNOSIS — R531 Weakness: Secondary | ICD-10-CM | POA: Diagnosis not present

## 2016-09-28 DIAGNOSIS — I69322 Dysarthria following cerebral infarction: Secondary | ICD-10-CM | POA: Diagnosis not present

## 2016-09-29 DIAGNOSIS — I69322 Dysarthria following cerebral infarction: Secondary | ICD-10-CM | POA: Diagnosis not present

## 2016-09-29 DIAGNOSIS — R531 Weakness: Secondary | ICD-10-CM | POA: Diagnosis not present

## 2016-10-01 DIAGNOSIS — R531 Weakness: Secondary | ICD-10-CM | POA: Diagnosis not present

## 2016-10-01 DIAGNOSIS — I69322 Dysarthria following cerebral infarction: Secondary | ICD-10-CM | POA: Diagnosis not present

## 2016-10-08 DIAGNOSIS — R531 Weakness: Secondary | ICD-10-CM | POA: Diagnosis not present

## 2016-10-08 DIAGNOSIS — I69322 Dysarthria following cerebral infarction: Secondary | ICD-10-CM | POA: Diagnosis not present

## 2016-10-21 ENCOUNTER — Ambulatory Visit (INDEPENDENT_AMBULATORY_CARE_PROVIDER_SITE_OTHER): Payer: Medicare Other | Admitting: Neurology

## 2016-10-21 ENCOUNTER — Encounter: Payer: Self-pay | Admitting: Neurology

## 2016-10-21 VITALS — BP 153/78 | HR 83 | Ht 59.0 in | Wt 127.0 lb

## 2016-10-21 DIAGNOSIS — I6330 Cerebral infarction due to thrombosis of unspecified cerebral artery: Secondary | ICD-10-CM

## 2016-10-21 DIAGNOSIS — I639 Cerebral infarction, unspecified: Secondary | ICD-10-CM | POA: Diagnosis not present

## 2016-10-21 DIAGNOSIS — I6381 Other cerebral infarction due to occlusion or stenosis of small artery: Secondary | ICD-10-CM

## 2016-10-21 NOTE — Progress Notes (Signed)
[facility] [address] [place]. [place] [address] [phone]       OFFICE FOLLOW-UP NOTE  Ms. [name] Date of Birth:  08/23/1928 Medical Record Number:  [id number]   HPI: 46 year pleasant elderly caucasian lady seen today for the first office follow-up visit following hospital admission for stroke in September 2017. Kimberly J Kirkpatrickis an 82 y.o.femalehistory of hypertension, osteoporosis, degenerative joint disease and benign positional vertigo, brought to the ED for evaluation of onset left facial weakness and slurred speech. Onset was at 3 PM on 09/15/2016. There is no previous history of stroke nor TIA. She's been on aspirin daily. CT scan of her head showed no acute intracranial abnormality. No weakness no numbness involving left extremities. NIH stroke score at the time of this evaluation was 2. Patient was not administered IV t-PA secondary to Beyond time window for treatment consideration. She was admitted for further evaluation and treatment. Patient did remarkably well during the hospital stay and showed improvement. MRI scan of the brain showed a small lacunar infarct in the right corona radiata. MRA of the brain showed no large vessel occlusion or stenosis. Carotid ultrasound showed no significant extracranial stenosis. LDL cholesterol was elevated 125 mg percent. Hemoglobin A1c is 5.7. Transthoracic 6 echo showed normal ejection fraction. Patient was started on aspirin for stroke prevention. She states she has some speech therapy at home but she is recovered and that has been discontinued. Only occasionally she has stuttering particularly when she does report fast. Her blood pressure is usually quite good at home but today it is elevated 153/78 and she blames this on traffic and anxiety from this visit. The patient had trouble tolerating Lipitor and alert severe diarrhea and discontinue it for 2 weeks and is currently back on it on half the dose  and tolerating it well. She has not had follow-up lipid profile checked yet. She has no other complaints.  ROS:   14 system review of systems is positive for  fatigue, leg swelling, diarrhea, incontinence, joint pain, cramps, runny nose, decreased energy and all other systems negative PMH:  Past Medical History:  Diagnosis Date  . Benign positional vertigo   . Bursitis started 10-2011   left leg and left hip  . DJD (degenerative joint disease)   . Hypertension   . Osteoporosis   . Stroke ([place])   . Varicose veins     Social History:  Social History   Social History  . Marital status: Widowed    Spouse name: N/A  . Number of children: N/A  . Years of education: N/A   Occupational History  . Not on file.   Social History Main Topics  . Smoking status: Never Smoker  . Smokeless tobacco: Never Used  . Alcohol use No  . Drug use: No  . Sexual activity: Not on file   Other Topics Concern  . Not on file   Social History Narrative  . No narrative on file    Medications:   Current Outpatient Prescriptions on File Prior to Visit  Medication Sig Dispense Refill  . atorvastatin (LIPITOR) 10 MG tablet Take 1 tablet (10 mg total) by mouth daily at 6 PM. (Patient taking differently: Take 10 mg by mouth daily at 6 PM. ) 30 tablet 0  . calcium gluconate 500 MG tablet Take 500 mg by mouth daily.      . Cholecalciferol (VITAMIN D) 2000 UNITS CAPS Take 5,000 capsules by mouth daily.     Marland Kitchen  Cranberry Extract 250 MG TABS Take 1 tablet by mouth daily.      Marland Kitchen dextroamphetamine (DEXTROSTAT) 5 MG tablet Take 5 mg by mouth daily.      . fluticasone (FLONASE) 50 MCG/ACT nasal spray Place 2 sprays into both nostrils daily.    Marland Kitchen ibuprofen (ADVIL,MOTRIN) 200 MG tablet Take 200 mg by mouth every 6 (six) hours as needed for headache or moderate pain.     Marland Kitchen lisinopril (PRINIVIL,ZESTRIL) 10 MG tablet Take 10 mg by mouth daily.    Marland Kitchen loratadine (CLARITIN) 10 MG tablet Take 10 mg by mouth daily as  needed for allergies or itching.     . Lutein 6 MG CAPS Take 6 mg by mouth daily.      . Magnesium 300 MG CAPS Take 1 capsule by mouth daily.      . meclizine (ANTIVERT) 25 MG tablet Take 25 mg by mouth 2 (two) times daily as needed for dizziness.     . Psyllium (METAMUCIL FIBER PO) Take 1 scoop by mouth daily as needed (constipation).    . triamcinolone cream (KENALOG) 0.5 % Apply 1 application topically 2 (two) times daily as needed (rash/ eczema).    . vitamin B-12 (CYANOCOBALAMIN) 1000 MCG tablet Take 1,000 mcg by mouth daily.       No current facility-administered medications on file prior to visit.     Allergies:   Allergies  Allergen Reactions  . Amoxicillin Diarrhea  . Ciprofloxacin Other (See Comments)    unknown  . Clindamycin/Lincomycin Diarrhea  . Codeine Nausea And Vomiting  . Tetanus Toxoids     unknown  . Ultracet [Tramadol-Acetaminophen] Nausea And Vomiting  . Penicillins Hives, Swelling and Rash    Has patient had a PCN reaction causing immediate rash, facial/tongue/throat swelling, SOB or lightheadedness with hypotension: swelling of arm, and rash Has patient had a PCN reaction causing severe rash involving mucus membranes or skin necrosis: unknown Has patient had a PCN reaction that required hospitalization : unknown Has patient had a PCN reaction occurring within the last 10 years: no If all of the above answers are "NO", then may proceed with Cephalosporin use.     Physical Exam General: well developed, well nourished, seated, in no evident distress Head: head normocephalic and atraumatic.  Neck: supple with no carotid or supraclavicular bruits Cardiovascular: regular rate and rhythm, no murmurs Musculoskeletal: no deformity.Mild kyphoscoliosis Skin:  no rash/petichiae Vascular:  Normal pulses all extremities Vitals:   10/21/16 0941  BP: (!) 153/78  Pulse: 83   Neurologic Exam Mental Status: Awake and fully alert. Oriented to place and time. Recent  and remote memory intact. Attention span, concentration and fund of knowledge appropriate. Mood and affect appropriate.  Cranial Nerves: Fundoscopic exam reveals sharp disc margins. Pupils equal, briskly reactive to light. Extraocular movements full without nystagmus. Visual fields full to confrontation. Hearing intact. Facial sensation intact. Face, tongue, palate moves normally and symmetrically.  Motor: Normal bulk and tone. Normal strength in all tested extremity muscles. Diminished fine finger movements on the left. Orbits right over left upper extremity. Sensory.: intact to touch ,pinprick .position and vibratory sensation.  Coordination: Rapid alternating movements normal in all extremities. Finger-to-nose and heel-to-shin performed accurately bilaterally. Gait and Station: Arises from chair without difficulty. Stance is normal. Gait demonstrates normal stride length and balance . Able to heel, toe and tandem walk with moderate difficulty.  Reflexes: 1+ and symmetric. Toes downgoing.   NIHSS  0 Modified Rankin  1  ASSESSMENT: 41 year lady with right subcortical lacunar infarct in September 2017 secondary to small vessel disease with vascular risk factors of hypertension, hyperlipidemia and age. She is doing clinically quite well with no residual deficits.    PLAN: I had a long d/w patient about her recent  Lacunar stroke, risk for recurrent stroke/TIAs, personally independently reviewed imaging studies and stroke evaluation results and answered questions.Continue aspirin 325 mg daily  for secondary stroke prevention and maintain strict control of hypertension with blood pressure goal below 130/90, diabetes with hemoglobin A1c goal below 6.5% and lipids with LDL cholesterol goal below 70 mg/dL. I also advised the patient to eat a healthy diet with plenty of whole grains, cereals, fruits and vegetables, exercise regularly and maintain ideal body weightGreater than 50% of time during this 25  minute visit was spent on counseling,explanation of diagnosis, planning of further management, discussion with patient and family and coordination of care. Followup in the future with my nurse practitioner in 6 months or call earlier if necessary. Antony Contras, MD  [facility] [address] [place], [place] [address]  Phone [phone] Fax [phone]  Note: This document was prepared with digital dictation and possible smart phrase technology. Any transcriptional errors that result from this process are unintentional

## 2016-10-21 NOTE — Patient Instructions (Signed)
I had a long d/w patient about her recent  Lacunar stroke, risk for recurrent stroke/TIAs, personally independently reviewed imaging studies and stroke evaluation results and answered questions.Continue aspirin 325 mg daily  for secondary stroke prevention and maintain strict control of hypertension with blood pressure goal below 130/90, diabetes with hemoglobin A1c goal below 6.5% and lipids with LDL cholesterol goal below 70 mg/dL. I also advised the patient to eat a healthy diet with plenty of whole grains, cereals, fruits and vegetables, exercise regularly and maintain ideal body weight Followup in the future with my nurse practitioner in 6 months or call earlier if necessary. Stroke Prevention Some medical conditions and behaviors are associated with an increased chance of having a stroke. You may prevent a stroke by making healthy choices and managing medical conditions. HOW CAN I REDUCE MY RISK OF HAVING A STROKE?   Stay physically active. Get at least 30 minutes of activity on most or all days.  Do not smoke. It may also be helpful to avoid exposure to secondhand smoke.  Limit alcohol use. Moderate alcohol use is considered to be:  No more than 2 drinks per day for men.  No more than 1 drink per day for nonpregnant women.  Eat healthy foods. This involves:  Eating 5 or more servings of fruits and vegetables a day.  Making dietary changes that address high blood pressure (hypertension), high cholesterol, diabetes, or obesity.  Manage your cholesterol levels.  Making food choices that are high in fiber and low in saturated fat, trans fat, and cholesterol may control cholesterol levels.  Take any prescribed medicines to control cholesterol as directed by your health care provider.  Manage your diabetes.  Controlling your carbohydrate and sugar intake is recommended to manage diabetes.  Take any prescribed medicines to control diabetes as directed by your health care  provider.  Control your hypertension.  Making food choices that are low in salt (sodium), saturated fat, trans fat, and cholesterol is recommended to manage hypertension.  Ask your health care provider if you need treatment to lower your blood pressure. Take any prescribed medicines to control hypertension as directed by your health care provider.  If you are 12-77 years of age, have your blood pressure checked every 3-5 years. If you are 37 years of age or older, have your blood pressure checked every year.  Maintain a healthy weight.  Reducing calorie intake and making food choices that are low in sodium, saturated fat, trans fat, and cholesterol are recommended to manage weight.  Stop drug abuse.  Avoid taking birth control pills.  Talk to your health care provider about the risks of taking birth control pills if you are over 24 years old, smoke, get migraines, or have ever had a blood clot.  Get evaluated for sleep disorders (sleep apnea).  Talk to your health care provider about getting a sleep evaluation if you snore a lot or have excessive sleepiness.  Take medicines only as directed by your health care provider.  For some people, aspirin or blood thinners (anticoagulants) are helpful in reducing the risk of forming abnormal blood clots that can lead to stroke. If you have the irregular heart rhythm of atrial fibrillation, you should be on a blood thinner unless there is a good reason you cannot take them.  Understand all your medicine instructions.  Make sure that other conditions (such as anemia or atherosclerosis) are addressed. SEEK IMMEDIATE MEDICAL CARE IF:   You have sudden weakness or numbness of  the face, arm, or leg, especially on one side of the body.  Your face or eyelid droops to one side.  You have sudden confusion.  You have trouble speaking (aphasia) or understanding.  You have sudden trouble seeing in one or both eyes.  You have sudden trouble  walking.  You have dizziness.  You have a loss of balance or coordination.  You have a sudden, severe headache with no known cause.  You have new chest pain or an irregular heartbeat. Any of these symptoms may represent a serious problem that is an emergency. Do not wait to see if the symptoms will go away. Get medical help at once. Call your local emergency services (911 in [place] Do not drive yourself to the hospital.   This information is not intended to replace advice given to you by your health care provider. Make sure you discuss any questions you have with your health care provider.   Document Released: 01/21/2005 Document Revised: 01/04/2015 Document Reviewed: 06/16/2013 Elsevier Interactive Patient Education [employer].

## 2016-10-28 ENCOUNTER — Telehealth: Payer: Self-pay | Admitting: Neurology

## 2016-10-28 NOTE — Telephone Encounter (Signed)
I spoke to Dr. Doristine Counter today dentist, patient needs some teeth out, she is currently on aspirin 325 and Dr. Lerry Liner is okay with continuing that. I think it is fine to go ahead and perform the dental procedure and remove several teeth. She will continue aspirin 325 without interruption.

## 2016-10-28 NOTE — Telephone Encounter (Signed)
thanks

## 2016-12-14 DIAGNOSIS — I10 Essential (primary) hypertension: Secondary | ICD-10-CM | POA: Diagnosis not present

## 2016-12-14 DIAGNOSIS — E7800 Pure hypercholesterolemia, unspecified: Secondary | ICD-10-CM | POA: Diagnosis not present

## 2016-12-14 DIAGNOSIS — M62838 Other muscle spasm: Secondary | ICD-10-CM | POA: Diagnosis not present

## 2016-12-22 DIAGNOSIS — M25532 Pain in left wrist: Secondary | ICD-10-CM | POA: Diagnosis not present

## 2016-12-22 DIAGNOSIS — R29898 Other symptoms and signs involving the musculoskeletal system: Secondary | ICD-10-CM | POA: Diagnosis not present

## 2016-12-22 DIAGNOSIS — S52592A Other fractures of lower end of left radius, initial encounter for closed fracture: Secondary | ICD-10-CM | POA: Diagnosis not present

## 2016-12-22 DIAGNOSIS — S52692A Other fracture of lower end of left ulna, initial encounter for closed fracture: Secondary | ICD-10-CM | POA: Diagnosis not present

## 2016-12-22 DIAGNOSIS — S6992XA Unspecified injury of left wrist, hand and finger(s), initial encounter: Secondary | ICD-10-CM | POA: Diagnosis not present

## 2016-12-22 DIAGNOSIS — W19XXXA Unspecified fall, initial encounter: Secondary | ICD-10-CM | POA: Diagnosis not present

## 2016-12-22 DIAGNOSIS — I10 Essential (primary) hypertension: Secondary | ICD-10-CM | POA: Diagnosis not present

## 2016-12-23 ENCOUNTER — Encounter (HOSPITAL_COMMUNITY): Payer: Self-pay | Admitting: *Deleted

## 2016-12-23 DIAGNOSIS — S52552A Other extraarticular fracture of lower end of left radius, initial encounter for closed fracture: Secondary | ICD-10-CM | POA: Diagnosis not present

## 2016-12-23 DIAGNOSIS — M25532 Pain in left wrist: Secondary | ICD-10-CM | POA: Diagnosis not present

## 2016-12-23 NOTE — Progress Notes (Signed)
Spoke with [name] and [name]'s daughter, Loletha Carrow for pre-op call. [name] denies cardiac history, chest pain or sob. [name] is not diabetic. [name] did have a "small" stroke in September, all deficits now improved. Vickie states that Dr. Amedeo Plenty spoke with Dr. Felipa Eth and that they agreed for [name] to stay on her Aspirin.

## 2016-12-24 ENCOUNTER — Inpatient Hospital Stay (HOSPITAL_COMMUNITY)
Admission: RE | Admit: 2016-12-24 | Discharge: 2016-12-26 | DRG: 512 | Disposition: A | Discharge status: Home Health | Payer: Medicare Other | Source: Ambulatory Visit | Attending: Orthopedic Surgery | Admitting: Orthopedic Surgery

## 2016-12-24 ENCOUNTER — Encounter (HOSPITAL_COMMUNITY)
Admission: RE | Disposition: A | Discharge status: Home Health | Payer: Self-pay | Source: Ambulatory Visit | Attending: Orthopedic Surgery

## 2016-12-24 ENCOUNTER — Ambulatory Visit (HOSPITAL_COMMUNITY): Payer: Medicare Other | Admitting: Anesthesiology

## 2016-12-24 ENCOUNTER — Encounter (HOSPITAL_COMMUNITY): Payer: Self-pay | Admitting: Surgery

## 2016-12-24 DIAGNOSIS — S52609A Unspecified fracture of lower end of unspecified ulna, initial encounter for closed fracture: Secondary | ICD-10-CM | POA: Diagnosis present

## 2016-12-24 DIAGNOSIS — S52602A Unspecified fracture of lower end of left ulna, initial encounter for closed fracture: Secondary | ICD-10-CM | POA: Diagnosis present

## 2016-12-24 DIAGNOSIS — Z885 Allergy status to narcotic agent status: Secondary | ICD-10-CM

## 2016-12-24 DIAGNOSIS — I739 Peripheral vascular disease, unspecified: Secondary | ICD-10-CM | POA: Diagnosis present

## 2016-12-24 DIAGNOSIS — I10 Essential (primary) hypertension: Secondary | ICD-10-CM | POA: Diagnosis present

## 2016-12-24 DIAGNOSIS — S52509A Unspecified fracture of the lower end of unspecified radius, initial encounter for closed fracture: Principal | ICD-10-CM | POA: Diagnosis present

## 2016-12-24 DIAGNOSIS — M810 Age-related osteoporosis without current pathological fracture: Secondary | ICD-10-CM | POA: Diagnosis present

## 2016-12-24 DIAGNOSIS — Z880 Allergy status to penicillin: Secondary | ICD-10-CM | POA: Diagnosis not present

## 2016-12-24 DIAGNOSIS — S52522A Torus fracture of lower end of left radius, initial encounter for closed fracture: Secondary | ICD-10-CM | POA: Diagnosis not present

## 2016-12-24 DIAGNOSIS — S52502A Unspecified fracture of the lower end of left radius, initial encounter for closed fracture: Secondary | ICD-10-CM | POA: Diagnosis present

## 2016-12-24 DIAGNOSIS — W19XXXA Unspecified fall, initial encounter: Secondary | ICD-10-CM | POA: Diagnosis not present

## 2016-12-24 DIAGNOSIS — R262 Difficulty in walking, not elsewhere classified: Secondary | ICD-10-CM

## 2016-12-24 DIAGNOSIS — Z887 Allergy status to serum and vaccine status: Secondary | ICD-10-CM | POA: Diagnosis not present

## 2016-12-24 DIAGNOSIS — M1990 Unspecified osteoarthritis, unspecified site: Secondary | ICD-10-CM | POA: Diagnosis present

## 2016-12-24 DIAGNOSIS — S62102A Fracture of unspecified carpal bone, left wrist, initial encounter for closed fracture: Secondary | ICD-10-CM | POA: Diagnosis not present

## 2016-12-24 HISTORY — DX: Enterocolitis due to Clostridium difficile, not specified as recurrent: A04.72

## 2016-12-24 HISTORY — DX: Family history of other specified conditions: Z84.89

## 2016-12-24 HISTORY — DX: Other specified postprocedural states: Z98.890

## 2016-12-24 HISTORY — PX: ORIF WRIST FRACTURE: SHX2133

## 2016-12-24 HISTORY — DX: Other specified postprocedural states: R11.2

## 2016-12-24 HISTORY — DX: Cramp and spasm: R25.2

## 2016-12-24 HISTORY — DX: Pneumonia, unspecified organism: J18.9

## 2016-12-24 LAB — BASIC METABOLIC PANEL
Anion gap: 6 (ref 5–15)
BUN: 10 mg/dL (ref 6–20)
CALCIUM: 8.7 mg/dL — AB (ref 8.9–10.3)
CO2: 26 mmol/L (ref 22–32)
CREATININE: 0.78 mg/dL (ref 0.44–1.00)
Chloride: 97 mmol/L — ABNORMAL LOW (ref 101–111)
GFR calc Af Amer: >60 mL/min (ref >60)
Glucose, Bld: 104 mg/dL — ABNORMAL HIGH (ref 65–99)
Potassium: 3.6 mmol/L (ref 3.5–5.1)
SODIUM: 129 mmol/L — AB (ref 135–145)

## 2016-12-24 LAB — CBC
HCT: 35.3 % — ABNORMAL LOW (ref 36.0–46.0)
Hemoglobin: 11.3 g/dL — ABNORMAL LOW (ref 12.0–15.0)
MCH: 27.9 pg (ref 26.0–34.0)
MCHC: 32 g/dL (ref 30.0–36.0)
MCV: 87.2 fL (ref 78.0–100.0)
PLATELETS: 278 10*3/uL (ref 150–400)
RBC: 4.05 MIL/uL (ref 3.87–5.11)
RDW: 14.1 % (ref 11.5–15.5)
WBC: 9.5 10*3/uL (ref 4.0–10.5)

## 2016-12-24 SURGERY — OPEN REDUCTION INTERNAL FIXATION (ORIF) WRIST FRACTURE
Anesthesia: Monitor Anesthesia Care | Site: Wrist | Laterality: Left

## 2016-12-24 MED ORDER — ROPIVACAINE HCL 5 MG/ML IJ SOLN
INTRAMUSCULAR | Status: DC | PRN
  Administered 2016-12-24: 20 mL via PERINEURAL

## 2016-12-24 MED ORDER — VITAMIN C 500 MG PO TABS
1000.0000 mg | ORAL_TABLET | Freq: Every day | ORAL | Status: DC
  Administered 2016-12-25 – 2016-12-26 (×2): 1000 mg via ORAL
  Filled 2016-12-24: qty 2

## 2016-12-24 MED ORDER — VANCOMYCIN HCL IN DEXTROSE 1-5 GM/200ML-% IV SOLN
INTRAVENOUS | Status: AC
  Filled 2016-12-24: qty 200

## 2016-12-24 MED ORDER — FAMOTIDINE 20 MG PO TABS
20.0000 mg | ORAL_TABLET | Freq: Two times a day (BID) | ORAL | Status: DC | PRN
Start: 2016-12-24 — End: 2016-12-26

## 2016-12-24 MED ORDER — 0.9 % SODIUM CHLORIDE (POUR BTL) OPTIME
TOPICAL | Status: DC | PRN
  Administered 2016-12-24: 1000 mL

## 2016-12-24 MED ORDER — HYDROMORPHONE HCL 1 MG/ML IJ SOLN
INTRAMUSCULAR | Status: AC
  Filled 2016-12-24: qty 0.5

## 2016-12-24 MED ORDER — LISINOPRIL 10 MG PO TABS
10.0000 mg | ORAL_TABLET | Freq: Every day | ORAL | Status: DC
  Administered 2016-12-25 – 2016-12-26 (×2): 10 mg via ORAL
  Filled 2016-12-24: qty 1

## 2016-12-24 MED ORDER — FENTANYL CITRATE (PF) 100 MCG/2ML IJ SOLN
INTRAMUSCULAR | Status: DC | PRN
  Administered 2016-12-24 (×2): 50 ug via INTRAVENOUS

## 2016-12-24 MED ORDER — LACTATED RINGERS IV SOLN
INTRAVENOUS | Status: DC
  Administered 2016-12-24 (×2): via INTRAVENOUS

## 2016-12-24 MED ORDER — MORPHINE SULFATE (PF) 2 MG/ML IV SOLN
1.0000 mg | INTRAVENOUS | Status: DC | PRN
  Administered 2016-12-24: 1 mg via INTRAVENOUS
  Filled 2016-12-24: qty 1

## 2016-12-24 MED ORDER — FENTANYL CITRATE (PF) 100 MCG/2ML IJ SOLN
INTRAMUSCULAR | Status: AC
  Administered 2016-12-24: 100 ug
  Filled 2016-12-24: qty 2

## 2016-12-24 MED ORDER — PSYLLIUM 51.7 % PO PACK: PACK | Freq: Every day | ORAL | Status: DC | PRN

## 2016-12-24 MED ORDER — ONDANSETRON HCL 4 MG/2ML IJ SOLN
INTRAMUSCULAR | Status: DC | PRN
  Administered 2016-12-24: 4 mg via INTRAVENOUS

## 2016-12-24 MED ORDER — VANCOMYCIN HCL IN DEXTROSE 1-5 GM/200ML-% IV SOLN
1000.0000 mg | INTRAVENOUS | Status: AC
  Administered 2016-12-24: 1000 mg via INTRAVENOUS

## 2016-12-24 MED ORDER — PROPOFOL 10 MG/ML IV BOLUS
INTRAVENOUS | Status: AC
  Filled 2016-12-24: qty 20

## 2016-12-24 MED ORDER — PSYLLIUM 95 % PO PACK
1.0000 | PACK | Freq: Every day | ORAL | Status: DC | PRN
Start: 2016-12-24 — End: 2016-12-26
  Filled 2016-12-24: qty 1

## 2016-12-24 MED ORDER — HYDROMORPHONE HCL 1 MG/ML IJ SOLN
INTRAMUSCULAR | Status: AC
  Filled 2016-12-24: qty 1.5

## 2016-12-24 MED ORDER — ONDANSETRON HCL 4 MG PO TABS: 8.0000 mg | ORAL_TABLET | Freq: Three times a day (TID) | ORAL | Status: DC | PRN

## 2016-12-24 MED ORDER — HYDROMORPHONE HCL 1 MG/ML IJ SOLN
0.2500 mg | INTRAMUSCULAR | Status: DC | PRN
  Administered 2016-12-24 (×3): 0.5 mg via INTRAVENOUS

## 2016-12-24 MED ORDER — VANCOMYCIN HCL IN DEXTROSE 750-5 MG/150ML-% IV SOLN
750.0000 mg | INTRAVENOUS | Status: DC
  Administered 2016-12-25: 750 mg via INTRAVENOUS
  Filled 2016-12-24: qty 150

## 2016-12-24 MED ORDER — CHLORHEXIDINE GLUCONATE 4 % EX LIQD: 60.0000 mL | Freq: Once | CUTANEOUS | Status: DC

## 2016-12-24 MED ORDER — METHOCARBAMOL 500 MG PO TABS
500.0000 mg | ORAL_TABLET | Freq: Four times a day (QID) | ORAL | Status: DC | PRN
  Administered 2016-12-25: 500 mg via ORAL
  Filled 2016-12-24 (×2): qty 1

## 2016-12-24 MED ORDER — PROMETHAZINE HCL 25 MG/ML IJ SOLN
INTRAMUSCULAR | Status: AC
  Filled 2016-12-24: qty 1

## 2016-12-24 MED ORDER — LACTATED RINGERS IV SOLN
INTRAVENOUS | Status: DC
  Administered 2016-12-24 – 2016-12-25 (×2): via INTRAVENOUS

## 2016-12-24 MED ORDER — DOCUSATE SODIUM 100 MG PO CAPS
100.0000 mg | ORAL_CAPSULE | Freq: Two times a day (BID) | ORAL | Status: DC
  Administered 2016-12-25 – 2016-12-26 (×2): 100 mg via ORAL
  Filled 2016-12-24 (×2): qty 1

## 2016-12-24 MED ORDER — FLUTICASONE PROPIONATE 50 MCG/ACT NA SUSP
2.0000 | Freq: Every day | NASAL | Status: DC
  Administered 2016-12-25 – 2016-12-26 (×2): 2 via NASAL
  Filled 2016-12-24: qty 16

## 2016-12-24 MED ORDER — PROPOFOL 500 MG/50ML IV EMUL
INTRAVENOUS | Status: DC | PRN
  Administered 2016-12-24: 75 ug/kg/min via INTRAVENOUS

## 2016-12-24 MED ORDER — ASPIRIN 325 MG PO TABS
325.0000 mg | ORAL_TABLET | Freq: Every day | ORAL | Status: DC
  Administered 2016-12-25 – 2016-12-26 (×2): 325 mg via ORAL
  Filled 2016-12-24: qty 1

## 2016-12-24 MED ORDER — DEXAMETHASONE SODIUM PHOSPHATE 10 MG/ML IJ SOLN
INTRAMUSCULAR | Status: DC | PRN
  Administered 2016-12-24: 10 mg via INTRAVENOUS

## 2016-12-24 MED ORDER — FENTANYL CITRATE (PF) 100 MCG/2ML IJ SOLN
INTRAMUSCULAR | Status: AC
  Filled 2016-12-24: qty 2

## 2016-12-24 MED ORDER — TAPENTADOL HCL 50 MG PO TABS
50.0000 mg | ORAL_TABLET | ORAL | Status: DC | PRN
  Administered 2016-12-25 – 2016-12-26 (×3): 50 mg via ORAL
  Filled 2016-12-24 (×3): qty 1

## 2016-12-24 MED ORDER — DEXTROSE 5 % IV SOLN: 500.0000 mg | Freq: Four times a day (QID) | INTRAVENOUS | Status: DC | PRN

## 2016-12-24 MED ORDER — PROMETHAZINE HCL 25 MG/ML IJ SOLN
6.2500 mg | INTRAMUSCULAR | Status: DC | PRN
Start: 2016-12-24 — End: 2016-12-24
  Administered 2016-12-24: 6.25 mg via INTRAVENOUS

## 2016-12-24 SURGICAL SUPPLY — 66 items
BANDAGE ACE 4X5 VEL STRL LF (GAUZE/BANDAGES/DRESSINGS) ×3 IMPLANT
BANDAGE ELASTIC 3 VELCRO ST LF (GAUZE/BANDAGES/DRESSINGS) ×3 IMPLANT
BIT DRILL 2.2 SS TIBIAL (BIT) ×3 IMPLANT
BLADE SURG ROTATE 9660 (MISCELLANEOUS) IMPLANT
BNDG ESMARK 4X9 LF (GAUZE/BANDAGES/DRESSINGS) ×3 IMPLANT
BNDG GAUZE ELAST 4 BULKY (GAUZE/BANDAGES/DRESSINGS) ×9 IMPLANT
CANISTER SUCTION 2500CC (MISCELLANEOUS) ×3 IMPLANT
CORDS BIPOLAR (ELECTRODE) ×3 IMPLANT
COVER SURGICAL LIGHT HANDLE (MISCELLANEOUS) ×3 IMPLANT
CUFF TOURNIQUET SINGLE 18IN (TOURNIQUET CUFF) ×3 IMPLANT
CUFF TOURNIQUET SINGLE 24IN (TOURNIQUET CUFF) IMPLANT
DRAIN TLS ROUND 10FR (DRAIN) IMPLANT
DRAPE OEC MINIVIEW 54X84 (DRAPES) IMPLANT
DRAPE SURG 17X23 STRL (DRAPES) ×3 IMPLANT
DRSG ADAPTIC 3X8 NADH LF (GAUZE/BANDAGES/DRESSINGS) ×3 IMPLANT
GAUZE SPONGE 4X4 12PLY STRL (GAUZE/BANDAGES/DRESSINGS) ×6 IMPLANT
GAUZE XEROFORM 1X8 LF (GAUZE/BANDAGES/DRESSINGS) ×3 IMPLANT
GAUZE XEROFORM 5X9 LF (GAUZE/BANDAGES/DRESSINGS) ×6 IMPLANT
GLOVE BIOGEL M 8.0 STRL (GLOVE) ×3 IMPLANT
GLOVE SS BIOGEL STRL SZ 8 (GLOVE) ×1 IMPLANT
GLOVE SUPERSENSE BIOGEL SZ 8 (GLOVE) ×2
GOWN STRL REUS W/ TWL LRG LVL3 (GOWN DISPOSABLE) ×1 IMPLANT
GOWN STRL REUS W/ TWL XL LVL3 (GOWN DISPOSABLE) ×2 IMPLANT
GOWN STRL REUS W/TWL LRG LVL3 (GOWN DISPOSABLE) ×2
GOWN STRL REUS W/TWL XL LVL3 (GOWN DISPOSABLE) ×4
K-WIRE 1.6 (WIRE) ×2
K-WIRE FX5X1.6XNS BN SS (WIRE) ×1
KIT BASIN OR (CUSTOM PROCEDURE TRAY) ×3 IMPLANT
KIT ROOM TURNOVER OR (KITS) ×3 IMPLANT
KWIRE FX5X1.6XNS BN SS (WIRE) ×1 IMPLANT
LOOP VESSEL MAXI BLUE (MISCELLANEOUS) IMPLANT
MANIFOLD NEPTUNE II (INSTRUMENTS) ×3 IMPLANT
NEEDLE 22X1 1/2 (OR ONLY) (NEEDLE) IMPLANT
NS IRRIG 1000ML POUR BTL (IV SOLUTION) ×3 IMPLANT
PACK ORTHO EXTREMITY (CUSTOM PROCEDURE TRAY) ×3 IMPLANT
PAD ARMBOARD 7.5X6 YLW CONV (MISCELLANEOUS) ×6 IMPLANT
PAD CAST 3X4 CTTN HI CHSV (CAST SUPPLIES) ×1 IMPLANT
PAD CAST 4YDX4 CTTN HI CHSV (CAST SUPPLIES) ×2 IMPLANT
PADDING CAST COTTON 3X4 STRL (CAST SUPPLIES) ×2
PADDING CAST COTTON 4X4 STRL (CAST SUPPLIES) ×4
PEG LOCKING SMOOTH 2.2X16 (Screw) ×3 IMPLANT
PEG LOCKING SMOOTH 2.2X18 (Peg) ×3 IMPLANT
PEG LOCKING SMOOTH 2.2X20 (Screw) ×6 IMPLANT
PEG LOCKING SMOOTH 2.2X22 (Screw) ×3 IMPLANT
PLATE NARROW DVR LEFT (Plate) ×3 IMPLANT
SCREW LOCK 12X2.7X 3 LD (Screw) ×1 IMPLANT
SCREW LOCK 14X2.7X 3 LD TPR (Screw) ×2 IMPLANT
SCREW LOCK 18X2.7X 3 LD TPR (Screw) ×1 IMPLANT
SCREW LOCKING 2.7X12MM (Screw) ×2 IMPLANT
SCREW LOCKING 2.7X13MM (Screw) ×3 IMPLANT
SCREW LOCKING 2.7X14 (Screw) ×4 IMPLANT
SCREW LOCKING 2.7X18 (Screw) ×2 IMPLANT
SPLINT FIBERGLASS 3X35 (CAST SUPPLIES) ×3 IMPLANT
SPONGE LAP 4X18 X RAY DECT (DISPOSABLE) IMPLANT
SUT MNCRL AB 4-0 PS2 18 (SUTURE) ×3 IMPLANT
SUT PROLENE 3 0 PS 2 (SUTURE) IMPLANT
SUT PROLENE 4 0 PS 2 18 (SUTURE) ×3 IMPLANT
SUT VIC AB 3-0 FS2 27 (SUTURE) IMPLANT
SYR CONTROL 10ML LL (SYRINGE) IMPLANT
SYSTEM CHEST DRAIN TLS 7FR (DRAIN) IMPLANT
TOWEL OR 17X24 6PK STRL BLUE (TOWEL DISPOSABLE) ×3 IMPLANT
TOWEL OR 17X26 10 PK STRL BLUE (TOWEL DISPOSABLE) ×3 IMPLANT
TUBE CONNECTING 12'X1/4 (SUCTIONS) ×1
TUBE CONNECTING 12X1/4 (SUCTIONS) ×2 IMPLANT
TUBE EVACUATION TLS (MISCELLANEOUS) ×3 IMPLANT
WATER STERILE IRR 1000ML POUR (IV SOLUTION) ×3 IMPLANT

## 2016-12-24 NOTE — Anesthesia Procedure Notes (Signed)
Procedures

## 2016-12-24 NOTE — Op Note (Signed)
See Dictation#[id number]  SP ORIF Radius left wrist and CR ulna  Jerome Viglione MD

## 2016-12-24 NOTE — H&P (Signed)
[name] is an 82 y.o. female.   Chief Complaint: left wrist fracture HPI: patient presents for ORIF left wrist fracture - displaced  Discussed with Dr Christiane Ha Stoneking-pts primary  Patient presents for evaluation and treatment of the of their upper extremity predicament. The patient denies neck, back, chest or  abdominal pain. The patient notes that they have no lower extremity problems. The patients primary complaint is noted. We are planning surgical care pathway for the upper extremity.  Past Medical History:  Diagnosis Date  . Benign positional vertigo   . Bursitis started 10-2011   left leg and left hip  . Clostridium difficile diarrhea    hx of 10 years ago  . DJD (degenerative joint disease)   . Family history of adverse reaction to anesthesia    daughter has nausea  . Hypertension   . Leg cramps   . Osteoporosis   . Pneumonia    as a baby  . PONV (postoperative nausea and vomiting)   . Stroke ([place])   . Varicose veins     Past Surgical History:  Procedure Laterality Date  . ABDOMINAL HYSTERECTOMY    . APPENDECTOMY    . CATARACT EXTRACTION     right  . COLONOSCOPY    . HERNIA REPAIR    . KYPHOSIS SURGERY    . TUBAL LIGATION     bilateral    Family History  Problem Relation Age of Onset  . Alzheimer's disease Mother   . Stroke Mother   . Coronary artery disease Father   . Ulcers Father    Social History:  reports that she has never smoked. She has never used smokeless tobacco. She reports that she does not drink alcohol or use drugs.  Allergies:  Allergies  Allergen Reactions  . Amoxicillin Diarrhea  . Ciprofloxacin Other (See Comments)    unknown  . Clindamycin/Lincomycin Diarrhea  . Codeine Nausea And Vomiting  . Tetanus Toxoids     unknown  . Ultracet [Tramadol-Acetaminophen] Nausea And Vomiting  . Penicillins Hives, Swelling and Rash    Has patient had a PCN reaction causing immediate rash, facial/tongue/throat swelling, SOB or  lightheadedness with hypotension: swelling of arm, and rash Has patient had a PCN reaction causing severe rash involving mucus membranes or skin necrosis: unknown Has patient had a PCN reaction that required hospitalization : unknown Has patient had a PCN reaction occurring within the last 10 years: no If all of the above answers are "NO", then may proceed with Cephalosporin use.     No prescriptions prior to admission.    No results found for this or any previous visit (from the past 48 hour(s)). No results found.  Review of Systems  Constitutional: Negative.   HENT: Negative.   Eyes: Negative.   Respiratory: Negative.   Cardiovascular: Negative.   Gastrointestinal: Negative.   Genitourinary: Negative.     There were no vitals taken for this visit. Physical Exam Displaced left distal radius fracture NVI LUE Splint LUE The patient is alert and oriented in no acute distress. The patient complains of pain in the affected upper extremity.  The patient is noted to have a normal HEENT exam. Lung fields show equal chest expansion and no shortness of breath. Abdomen exam is nontender without distention. Lower extremity examination does not show any fracture dislocation or blood clot symptoms. Pelvis is stable and the neck and back are stable and nontender.  Assessment/Plan We are planning surgery for your upper extremity. The  risk and benefits of surgery to include risk of bleeding, infection, anesthesia,  damage to normal structures and failure of the surgery to accomplish its intended goals of relieving symptoms and restoring function have been discussed in detail. With this in mind we plan to proceed. I have specifically discussed with the patient the pre-and postoperative regime and the dos and don'ts and risk and benefits in great detail. Risk and benefits of surgery also include risk of dystrophy(CRPS), chronic nerve pain, failure of the healing process to go onto completion and  other inherent risks of surgery The relavent the pathophysiology of the disease/injury process, as well as the alternatives for treatment and postoperative course of action has been discussed in great detail with the patient who desires to proceed.  We will do everything in our power to help you (the patient) restore function to the upper extremity. It is a pleasure to see this patient today.   Paulene Floor, MD 12/24/2016, 5:39 AM

## 2016-12-24 NOTE — Anesthesia Procedure Notes (Signed)
Procedure Name: MAC Date/Time: 12/24/2016 8:55 PM Performed by: Babs Bertin Pre-anesthesia Checklist: Patient identified, Emergency Drugs available, Suction available, Patient being monitored and Timeout performed Patient Re-evaluated:Patient Re-evaluated prior to inductionOxygen Delivery Method: Nasal cannula

## 2016-12-24 NOTE — Op Note (Signed)
Kimberly, [name] NO.:  [id number]  MEDICAL RECORD NO.:  [id number]  LOCATION:  5C16C                        FACILITY:  [facility]  PHYSICIAN:  Satira Anis. Copeland Neisen, M.D.DATE OF BIRTH:  06-07-1928  DATE OF PROCEDURE:12/24/2016 DATE OF DISCHARGE:TBD                             OPERATIVE REPORT   PREOPERATIVE DIAGNOSIS:  Comminuted complex left distal radius and ulnar fracture.  POSTOPERATIVE DIAGNOSIS:  Comminuted complex left distal radius and ulnar fracture.  PROCEDURE: 1. Open reduction and internal fixation with DVR Biomet plate and     screw construct, narrow plate in nature, left distal radius. 2. Closed reduction, left distal ulna. 3. Four view x-ray series/radiographic examination of the wrist, left     upper extremity.  SURGEON:  Satira Anis. Amedeo Plenty, M.D.  ASSISTANT:  Minerva Ends, surgical assistant.  TOURNIQUET TIME:  Less than an hour.  DRAINS:  One #7 TLS.  ESTIMATED BLOOD LOSS:  Minimal.  INDICATIONS FOR THE PROCEDURE:  This is an 82 year old female, with a markedly displaced distal radius and ulnar fracture.  She understands risks, benefits and desires to proceed.  Given her independent living status, she would like to proceed with fixation to try to give her the best wrist possible.  OPERATION IN DETAIL:  The patient was seen by myself and Anesthesia, taken to the operative theater, underwent a smooth induction of IV sedation, as preoperative block was in excellent working fashion.  She was given a prescrub by myself with Hibiclens scrub, followed by 10 minutes surgical Betadine scrub performed by myself.  Sterile field was secured.  Arm was elevated, tourniquet was insufflated.  Following this, volar radial incision was made about the distal radius after time-out was observed and vancomycin was given as a preoperative antibiotic. Dissection was carried down.  The median nerve was identified and kept out of harm's way.  FCR sheath was  incised dorsally and palmarly. Carpal canal contents were retracted ulnarly.  Pronator was incised, swept from radial to ulna.  Fracture was accessed and underwent reduction followed by provisional fixation and application of a narrow DVR plate and screw construct. I was able to achieve adequate height, inclination, and volar tilt without difficulty.  All screw lengths looked excellent.  There were no complicating features.  Radial height, inclination and volar tilt looked perfect.  I was pleased with this and the findings.  I then repaired the pronator after copious irrigation, and closed the wound over #7 TLS drain, with the tourniquet deflated and hemostasis secured.  The median nerve was stable, 4 view radiographic series looked excellent, in all was quite satisfactory.  I then performed a gentle closed reduction of the distal ulna, it went quite nicely and there were no complicating features, following the closed reduction.  I then very carefully and cautiously dressed the wound with Adaptic, Neosporin and a sugar-tong splint.  We will hold in a sugar-tong for 4 weeks and then begin interval range of motion in a short-arm cast/splint.  I want to give the distal ulna time to settle and sit secure. Her distal radioulnar joint moved smoothly at the conclusion of the procedure.  There were no complicating features.  I should note that the patient thus underwent distal radius  ORIF, closed reduction of her ulna and radiographic examination of the left wrist, all went quite well.  She will be admitted for IV the antibiotics, general postop observation, pain control, and other measures.  These notes have been discussed and all questions have been encouraged and answered.     Satira Anis. Amedeo Plenty, M.D.     [facility]  D:  12/24/2016  T:  12/24/2016  Job:  [id number]

## 2016-12-24 NOTE — Progress Notes (Signed)
Pharmacy Antibiotic Note  [name] is a 82 y.o. female admitted on 12/24/2016 with left wrist fx, now s/p ORIF.  Pharmacy has been consulted for vancomycin dosing.  Rec'd vanc 1g perioperatively, which will give 24 hours of coverage.  Plan: Vancomycin 750mg  IV every 24 hours.  Goal trough 15-20 mcg/mL.  F/U LOT.  Height: 4\' 11"  (149.9 cm) Weight: 126 lb 15.8 oz (57.6 kg) IBW/kg (Calculated) : 43.2  Temp (24hrs), Avg:98.1 F (36.7 C), Min:97 F (36.1 C), Max:99.2 F (37.3 C)   Recent Labs Lab 12/24/16 1711  WBC 9.5  CREATININE 0.78    Estimated Creatinine Clearance: 39 mL/min (by C-G formula based on SCr of 0.78 mg/dL).    Allergies  Allergen Reactions  . Amoxicillin Diarrhea  . Ciprofloxacin Other (See Comments)    unknown  . Clindamycin/Lincomycin Diarrhea  . Codeine Nausea And Vomiting  . Tetanus Toxoids     unknown  . Ultracet [Tramadol-Acetaminophen] Nausea And Vomiting  . Penicillins Hives, Swelling and Rash    Has patient had a PCN reaction causing immediate rash, facial/tongue/throat swelling, SOB or lightheadedness with hypotension: swelling of arm, and rash Has patient had a PCN reaction causing severe rash involving mucus membranes or skin necrosis: unknown Has patient had a PCN reaction that required hospitalization : unknown Has patient had a PCN reaction occurring within the last 10 years: no If all of the above answers are "NO", then may proceed with Cephalosporin use.      Thank you for allowing pharmacy to be a part of this patient's care.  Wynona Neat, PharmD, BCPS  12/24/2016 11:05 PM

## 2016-12-24 NOTE — Anesthesia Procedure Notes (Addendum)
Anesthesia Regional Block:  Supraclavicular block  Pre-Anesthetic Checklist: ,, timeout performed, Correct Patient, Correct Site, Correct Laterality, Correct Procedure, Correct Position, site marked, Risks and benefits discussed, Surgical consent,  Pre-op evaluation,  At surgeon's request  Laterality: Left and Lower  Prep: chloraprep       Needles:   Needle Type: Echogenic Stimulator Needle     Needle Length: 9cm 9 cm Needle Gauge: 21 and 21 G  Needle insertion depth: 4 cm   Additional Needles: Supraclavicular block Narrative:  Start time: 12/24/2016 6:45 PM End time: 12/24/2016 7:00 PM Injection made incrementally with aspirations every 5 mL.  Performed by: Personally  Anesthesiologist: Brady Schiller

## 2016-12-24 NOTE — Op Note (Deleted)
  The note originally documented on this encounter has been moved the the encounter in which it belongs.  

## 2016-12-24 NOTE — Anesthesia Postprocedure Evaluation (Signed)
Anesthesia Post Note  [name]  Procedure(s) Performed: Procedure(s) (LRB): OPEN REDUCTION INTERNAL FIXATION (ORIF) LEFT WRIST FRACTURE (Left)  Patient location during evaluation: PACU Anesthesia Type: MAC and Regional Level of consciousness: awake and alert Pain management: pain level controlled Vital Signs Assessment: post-procedure vital signs reviewed and stable Respiratory status: spontaneous breathing, nonlabored ventilation, respiratory function stable and patient connected to nasal cannula oxygen Cardiovascular status: stable and blood pressure returned to baseline Anesthetic complications: no       Last Vitals:  Vitals:   12/24/16 2238 12/24/16 2255  BP: (!) 137/95 (!) 164/63  Pulse: (!) 102 (!) 108  Resp: 12 15  Temp: 36.1 C 37 C    Last Pain:  Vitals:   12/24/16 2255  TempSrc: Oral  PainSc:                  Cherokee Boccio,JAMES TERRILL

## 2016-12-24 NOTE — Transfer of Care (Signed)
Immediate Anesthesia Transfer of Care Note  [name]  Procedure(s) Performed: Procedure(s): OPEN REDUCTION INTERNAL FIXATION (ORIF) LEFT WRIST FRACTURE (Left)  Patient Location: PACU  Anesthesia Type:MAC and Regional  Level of Consciousness: awake, alert  and oriented  Airway & Oxygen Therapy: Patient Spontanous Breathing  Post-op Assessment: Report given to RN and Post -op Vital signs reviewed and stable  Post vital signs: Reviewed and stable  Last Vitals:  Vitals:   12/24/16 1728  BP: (!) 162/67  Pulse: (!) 102  Resp: 18  Temp: 37.3 C    Last Pain:  Vitals:   12/24/16 1728  TempSrc: Oral  PainSc: 6       Patients Stated Pain Goal: 3 ([id number])  Complications: No apparent anesthesia complications

## 2016-12-24 NOTE — Anesthesia Preprocedure Evaluation (Addendum)
Anesthesia Evaluation  Patient identified by MRN, date of birth, ID band Patient awake    Reviewed: Allergy & Precautions, NPO status , Unable to perform ROS - Chart review only  History of Anesthesia Complications (+) PONV and history of anesthetic complications  Airway Mallampati: II   Neck ROM: Limited   Comment: Severe c- spine limited Dental  (+) Dental Advisory Given   Pulmonary neg pulmonary ROS,    breath sounds clear to auscultation       Cardiovascular hypertension, + Peripheral Vascular Disease   Rhythm:Regular Rate:Normal     Neuro/Psych    GI/Hepatic Neg liver ROS,   Endo/Other  negative endocrine ROS  Renal/GU negative Renal ROS     Musculoskeletal  (+) Arthritis ,   Abdominal   Peds  Hematology   Anesthesia Other Findings   Reproductive/Obstetrics                            Anesthesia Physical Anesthesia Plan  ASA: III  Anesthesia Plan: MAC and Regional   Post-op Pain Management:    Induction: Intravenous  Airway Management Planned: Simple Face Mask  Additional Equipment:   Intra-op Plan:   Post-operative Plan:   Informed Consent: I have reviewed the patients History and Physical, chart, labs and discussed the procedure including the risks, benefits and alternatives for the proposed anesthesia with the patient or authorized representative who has indicated his/her understanding and acceptance.   Dental advisory given  Plan Discussed with: CRNA  Anesthesia Plan Comments:         Anesthesia Quick Evaluation

## 2016-12-25 ENCOUNTER — Encounter (HOSPITAL_COMMUNITY): Payer: Self-pay | Admitting: Orthopedic Surgery

## 2016-12-25 NOTE — Progress Notes (Signed)
Drainage from test tube was 44ml

## 2016-12-25 NOTE — Progress Notes (Signed)
Patient has been seen and examined. Patient has pain appropriate to his injury/process. Patient denies new complaints at this present time. I have discussed the care pathway with nursing staff. Patient is appropriate and alert.  We reviewed vital signs and intake output which are stable.  The upper extremity is neurovascularly intact. Refill is normal. There is no signs of compartment syndrome. There is no signs of dystrophy. There is normal sensation.  I have spent a  great deal of time discussing range of motion edema control and other techniques to decrease edema and promote flexion extension of the fingers. Patient understands the importance of elevation range of motion massage and other measures to lessen pain and prevent swelling.  We have also discussed immobilization to appropriate areas involved.  We have discussed with the patient shoulder range of motion to prevent adhesive capsulitis.  The remainder of the examination is normal today without complicating feature.  Drain was removed without difficulty  Patient will be discharged home tomorrow. Will plan to see the patient back in the office as per discharge instructions (please see discharge instructions).   patient is stable awake alert and oriented in no acute distress. Regular diet will be continued and has been tolerated. Patient will notify should have problems occur. There is no signs of DVT infection or other complication at this juncture.  All questions have been incurred and answered.  Overall the patient looks excellent. We'll plan for discharge tomorrow.  [name] MDPatient ID: [name], female   DOB: May 25, 1928, 82 y.o.   MRN: [id number]

## 2016-12-25 NOTE — Evaluation (Signed)
Physical Therapy Evaluation Patient Details Name: [name] MRN: [id number] DOB: Nov 04, 1928 Today's Date: 12/25/2016   History of Present Illness  Pt is an 82 y.o. female s/p ORIF L radius wrist fracture and closed reduction of ulna. Pt has a PMG significant for: benign positional vertigo, bursitis (L leg and hip), degenerative joint disease, family history of adverse reaction to anesthesia, hypertension, leg cramps, osteoporosis, pneumonia, post-operative nausea and vomiting, stroke, and varicose veins.  Clinical Impression  Pt presents POD 1 following the above procedure. Pt is moving well with therapy, but is slightly unsteady and requires 1 hand held assist or [place] for stability with gait. Prior to admission, pt was completely independent and living alone. Pt will discharge home with her son who will assist PRN until she is able to return home. Pt will benefit from HHPT upon discharge in order to maximize her mobility and safety after discharge. Pt will benefit from continuing to be seen acutely in order to address below deficits.     Follow Up Recommendations Home health PT;Supervision for mobility/OOB    Equipment Recommendations  None recommended by PT;Other (comment) (Already has [place] at home)    Recommendations for Other Services       Precautions / Restrictions Precautions Precautions: Fall Restrictions Weight Bearing Restrictions: Yes LUE Weight Bearing: Non weight bearing      Mobility  Bed Mobility Overal bed mobility: Needs Assistance Bed Mobility: Supine to Sit;Sit to Supine     Supine to sit: Min assist;HOB elevated;Min guard Sit to supine: Min guard   General bed mobility comments: Min A initially with HOB elevatd for safety, but Min Guard for remainder for attempts  Transfers Overall transfer level: Needs assistance Equipment used: None Transfers: Sit to/from Stand Sit to Stand: Min guard         General transfer comment: Min guard for safety  from EOB  Ambulation/Gait Ambulation/Gait assistance: Min guard Ambulation Distance (Feet): 150 Feet Assistive device: Straight cane Gait Pattern/deviations: Step-through pattern Gait velocity: decreased Gait velocity interpretation: Below normal speed for age/gender General Gait Details: decreased cadence. Min cues for sequencing with   Stairs            Wheelchair Mobility    Modified Rankin (Stroke Patients Only)       Balance Overall balance assessment: Needs assistance Sitting-balance support: No upper extremity supported;Feet supported Sitting balance-Leahy Scale: Fair     Standing balance support: No upper extremity supported Standing balance-Leahy Scale: Fair Standing balance comment: Standing at sink to wash hands                             Pertinent Vitals/Pain Pain Assessment: No/denies pain (Still has numbess from block)    Home Living Family/patient expects to be discharged to:: Private residence Living Arrangements: Alone;Other (Comment) (Will DC to son's home) Available Help at Discharge: Family;Available PRN/intermittently;Other (Comment) (Son works primarily from home) Type of Home: House Home Access: Stairs to enter Entrance Stairs-Rails: None Technical brewer of Steps: 2 Home Layout: Two level;Able to live on main level with bedroom/bathroom Home Equipment: Shower seat - built in;Hand held shower head      Prior Function Level of Independence: Independent         Comments: was completely independent prior to admission living alone     Hand Dominance   Dominant Hand: Right    Extremity/Trunk Assessment   Upper Extremity Assessment Upper Extremity Assessment: Defer to  OT evaluation LUE Deficits / Details: Remaining numbness/tingling. Only able to actively move digit II and III this session. LUE Sensation: decreased light touch    Lower Extremity Assessment Lower Extremity Assessment: Generalized weakness  (Left knee OA )       Communication   Communication: No difficulties  Cognition Arousal/Alertness: Awake/alert Behavior During Therapy: WFL for tasks assessed/performed Overall Cognitive Status: Within Functional Limits for tasks assessed                      General Comments      Exercises     Assessment/Plan    PT Assessment Patient needs continued PT services  PT Problem List Decreased strength;Decreased range of motion;Decreased activity tolerance;Decreased balance;Decreased mobility;Decreased knowledge of use of DME;Pain          PT Treatment Interventions DME instruction;Gait training;Stair training;Functional mobility training;Therapeutic activities;Therapeutic exercise;Balance training;Patient/family education    PT Goals (Current goals can be found in the Care Plan section)  Acute Rehab PT Goals Patient Stated Goal: to go home and get back to volunteering at church PT Goal Formulation: With patient Time For Goal Achievement: 01/01/17 Potential to Achieve Goals: Good    Frequency Min 5X/week   Barriers to discharge        Co-evaluation               End of Session Equipment Utilized During Treatment: Gait belt Activity Tolerance: Patient tolerated treatment well Patient left: in bed;with call bell/phone within reach;Other (comment) (With lunch set up in front of her) Nurse Communication: Mobility status         Time: [id number] PT Time Calculation (min) (ACUTE ONLY): 34 min   Charges:   PT Evaluation $PT Eval Moderate Complexity: 1 Procedure PT Treatments $Gait Training: 8-22 mins   PT G Codes:        Scheryl Marten PT, DPT  [phone]  12/25/2016, 12:18 PM

## 2016-12-25 NOTE — Evaluation (Addendum)
Occupational Therapy Evaluation Patient Details Name: [name] MRN: [id number] DOB: 1928-03-17 Today's Date: 12/25/2016    History of Present Illness Pt is an 82 y.o. female s/p ORIF L radius wrist fracture and closed reduction of ulna. Pt has a PMG significant for: benign positional vertigo, bursitis (L leg and hip), degenerative joint disease, family history of adverse reaction to anesthesia, hypertension, leg cramps, osteoporosis, pneumonia, post-operative nausea and vomiting, stroke, and varicose veins.   Clinical Impression   PTA, pt was independent with ADL and functional mobility and was active in the community and driving. Pt currently requires min assist with UB ADL and grooming and mod assist for LB ADL. She was able to complete functional mobility to ambulate to bathroom with min assist. Noted wall/furniture walking and pt does better with hand held assist. Pt with decreased sensation in L hand as well as movement of L UE limited to her second and third digits. She reports feeling that her arm is "waking up" likely due to nerve block wearing off. OT will continue to follow acutely with focus on L UE edema control and ADL in preparation for D/C home to son's home. Son will be home most of the day and is able to work some from home. Recommend home health OT follow-up and 3-in-1 BSC.     Follow Up Recommendations  Home health OT;Supervision - Intermittent    Equipment Recommendations  3 in 1 bedside commode    Recommendations for Other Services       Precautions / Restrictions Precautions Precautions: Fall Restrictions Weight Bearing Restrictions: Yes LUE Weight Bearing: Non weight bearing      Mobility Bed Mobility Overal bed mobility: Needs Assistance Bed Mobility: Supine to Sit     Supine to sit: Min assist        Transfers Overall transfer level: Needs assistance Equipment used: 1 person hand held assist Transfers: Sit to/from Stand Sit to Stand:  Min assist              Balance Overall balance assessment: Needs assistance Sitting-balance support: No upper extremity supported;Feet supported Sitting balance-Leahy Scale: Fair     Standing balance support: Single extremity supported;During functional activity Standing balance-Leahy Scale: Fair                              ADL Overall ADL's : Needs assistance/impaired Eating/Feeding: Sitting;Minimal assistance   Grooming: Minimal assistance;Sitting   Upper Body Bathing: Minimal assistance;Sitting   Lower Body Bathing: Moderate assistance;Sit to/from stand   Upper Body Dressing : Minimal assistance;Sitting   Lower Body Dressing: Moderate assistance;Sit to/from stand   Toilet Transfer: Minimal assistance;Ambulation   Toileting- Clothing Manipulation and Hygiene: Supervision/safety;Sitting/lateral lean       Functional mobility during ADLs: Minimal assistance General ADL Comments: Pt does better with hand held assist and reaches for furniture without. Educated on compensatory ADL strategies, elevation, and edema control methods.     Vision Vision Assessment?: No apparent visual deficits   Perception     Praxis      Pertinent Vitals/Pain Pain Assessment: No/denies pain (Nerve block in effect)     Hand Dominance Right   Extremity/Trunk Assessment Upper Extremity Assessment Upper Extremity Assessment: LUE deficits/detail LUE Deficits / Details: Remaining numbness/tingling. Only able to actively move digit II and III this session. LUE Sensation: decreased light touch   Lower Extremity Assessment Lower Extremity Assessment: Generalized weakness  Communication Communication Communication: No difficulties;HOH (Slightly HOH)   Cognition Arousal/Alertness: Awake/alert Behavior During Therapy: WFL for tasks assessed/performed Overall Cognitive Status: Within Functional Limits for tasks assessed                     General  Comments       Exercises       Shoulder Instructions      Home Living Family/patient expects to be discharged to:: Private residence Living Arrangements: Alone (Will be discharging to son's home though) Available Help at Discharge: Family;Available PRN/intermittently (Son works primarily from home ) Type of Home: House             Bathroom Shower/Tub: Occupational psychologist: [place]: Civil engineer, contracting - built in;Hand held shower head          Prior Functioning/Environment Level of Independence: Independent                 OT Problem List: Decreased strength;Decreased range of motion;Decreased activity tolerance;Impaired balance (sitting and/or standing);Decreased safety awareness;Decreased knowledge of use of DME or AE;Decreased knowledge of precautions;Pain   OT Treatment/Interventions: Self-care/ADL training;Therapeutic exercise;DME and/or AE instruction;Energy conservation;Therapeutic activities;Patient/family education;Balance training    OT Goals(Current goals can be found in the care plan section) Acute Rehab OT Goals Patient Stated Goal: to go home and get back to volunteering at church OT Goal Formulation: With patient Time For Goal Achievement: 01/01/17 Potential to Achieve Goals: Good ADL Goals Pt Will Perform Upper Body Bathing: with modified independence;sitting Pt Will Perform Lower Body Bathing: with modified independence;sit to/from stand Pt Will Perform Upper Body Dressing: with modified independence;sitting Pt Will Perform Lower Body Dressing: with modified independence;sit to/from stand Pt Will Transfer to Toilet: with modified independence;ambulating;bedside commode Pt Will Perform Toileting - Clothing Manipulation and hygiene: with modified independence;sit to/from stand Pt Will Perform Tub/Shower Transfer: with supervision;shower seat;rolling walker;Shower transfer Pt/caregiver will Perform Home Exercise Program:  Left upper extremity;Increased ROM;With Supervision;With written HEP provided  OT Frequency: Min 2X/week   Barriers to D/C:            Co-evaluation              End of Session Equipment Utilized During Treatment: Gait belt;Other (comment) (L sling)  Activity Tolerance: Patient tolerated treatment well Patient left: in chair;with call bell/phone within reach (With pharmacy tech in room)   Time: [id number] OT Time Calculation (min): 28 min Charges:  OT General Charges $OT Visit: 1 Procedure OT Evaluation $OT Eval Moderate Complexity: 1 Procedure OT Treatments $Self Care/Home Management : 8-22 mins G-Codes:    Norman Herrlich, OTR/L [phone] 12/25/2016, 9:55 AM

## 2016-12-26 NOTE — Care Management Note (Signed)
Case Management Note  Patient Details  Name: [name] MRN: [id number] Date of Birth: [birth year]  Subjective/Objective:                  82 y.o. female who was admitted 12/24/2016 with LEFT WRIST FRACTURE.  Status post OPEN REDUCTION INTERNAL FIXATION (ORIF) LEFT WRIST FRACTURE on 12/24/16.    Action/Plan:  RNCM spoke with patient, son Ronalee Belts) and daughter-in-law Elpidio Eric).   Patient ready for discharge today with assistance from family and home health services. Patient will be discharged to son's home for approximately one week and then go to daughter's home Jocelyn [place]).  Home health referral called to Jacqlyn Larsen [phone]) at [facility], faxed to [facility] [phone]) and verified receipt.   DME referral for bedside commode called to Jeneen Rinks at [employer] and verified delivery. Spoke with patient's nurse Katie, advised of above, and AVS updated.  Expected Discharge Date: 12/26/16              Expected Discharge Plan:  [place]  In-House Referral:  NA  Discharge planning Services  CM Consult  Post Acute Care Choice:  Durable Medical Equipment, Home Health Choice offered to:  Patient  DME Arranged:  Bedside commode DME Agency:  [place] Arranged:  PT, OT [facility] Agency:  [place]  Status of Service:  Completed, signed off  If discussed at [place] of Stay Meetings, dates discussed:    Additional Comments:  New referral information (Demographics, d/c summary, HH orders, PT progress note, OT progress note, and Medicare face to face)  faxed to referral source Jacqlyn Larsen at [facility]) per her request [phone]). Also provided with the following information: Patient will be discharged today 12/26/16 to son's home from [facility].   Referral called to on-call nurse Jacqlyn Larsen).  Patient will be staying with son and daughter-in-law Ronalee Belts and Danasia Salado) for approximately one week then go to daughter's home  Jocelyn L[place]).     Ronalee Belts and [facility] [address] [place], [place] [address] Mike's cell phone number [phone]) and Pam's cell phone number [phone])   [name]  [address] [place], [place]  [address] Vicki's phone number [phone]     Serena Colonel, RN 12/26/2016, 2:41 PM

## 2016-12-26 NOTE — Discharge Summary (Signed)
Physician Discharge Summary  Patient ID: [name] MRN: [id number] DOB/AGE: 80-Mar-1929 80 y.o.  Admit date: 12/24/2016 Discharge date:   Admission Diagnoses: LEFT WRIST FRACTURE Past Medical History:  Diagnosis Date  . Benign positional vertigo   . Bursitis started 10-2011   left leg and left hip  . Clostridium difficile diarrhea    hx of 10 years ago  . DJD (degenerative joint disease)   . Family history of adverse reaction to anesthesia    daughter has nausea  . Hypertension   . Leg cramps   . Osteoporosis   . Pneumonia    as a baby  . PONV (postoperative nausea and vomiting)   . Stroke ([place])   . Varicose veins     Discharge Diagnoses:  Active Problems:   Radius and ulna distal fracture, left, closed, initial encounter   Surgeries: Procedure(s): OPEN REDUCTION INTERNAL FIXATION (ORIF) LEFT WRIST FRACTURE on 12/24/2016    Consultants:   Discharged Condition: Improved  Hospital Course: [name] is an 82 y.o. female who was admitted 12/24/2016 with a chief complaint of No chief complaint on file. , and found to have a diagnosis of LEFT WRIST FRACTURE.  They were brought to the operating room on 12/24/2016 and underwent Procedure(s): OPEN REDUCTION INTERNAL FIXATION (ORIF) LEFT WRIST FRACTURE.    They were given perioperative antibiotics: Anti-infectives    Start     Dose/Rate Route Frequency Ordered Stop   12/25/16 2200  vancomycin (VANCOCIN) IVPB 750 mg/150 ml premix     750 mg 150 mL/hr over 60 Minutes Intravenous Every 24 hours 12/24/16 2307     12/24/16 1745  vancomycin (VANCOCIN) IVPB 1000 mg/200 mL premix     1,000 mg 200 mL/hr over 60 Minutes Intravenous To [facility] 12/24/16 1710 12/24/16 2200   12/24/16 1713  vancomycin (VANCOCIN) 1-5 GM/200ML-% IVPB    Comments:  [name]   : cabinet override      12/24/16 1713 12/24/16 2100    .  They were given sequential compression devices, early ambulation,  Recent vital  signs: Patient Vitals for the past 24 hrs:  BP Temp Temp src Pulse Resp SpO2  12/26/16 0357 (!) 132/56 98.2 F (36.8 C) Oral 93 16 96 %  12/25/16 1954 (!) 119/50 99.8 F (37.7 C) Oral (!) 104 16 95 %  12/25/16 1330 (!) 137/53 99.4 F (37.4 C) Oral (!) 111 16 94 %  .  Recent laboratory studies: No results found.  Discharge Medications:   Allergies as of 12/26/2016      Reactions   Ciprofloxacin Other (See Comments)   unknown   Tetanus Toxoids Other (See Comments)   unknown   Amoxicillin Diarrhea   Clindamycin/lincomycin Diarrhea   Codeine Nausea And Vomiting   Penicillins Hives, Swelling, Rash, Other (See Comments)   Has patient had a PCN reaction causing immediate rash, facial/tongue/throat swelling, SOB or lightheadedness with hypotension: swelling of arm, and rash Has patient had a PCN reaction causing severe rash involving mucus membranes or skin necrosis: unknown Has patient had a PCN reaction that required hospitalization : unknown Has patient had a PCN reaction occurring within the last 10 years: no If all of the above answers are "NO", then may proceed with Cephalosporin use.   Ultracet [tramadol-acetaminophen] Nausea And Vomiting      Medication List    TAKE these medications   aspirin 325 MG tablet Take 325 mg by mouth daily.   atorvastatin 10 MG tablet Commonly  known as:  LIPITOR Take 1 tablet (10 mg total) by mouth daily at 6 PM.   calcium gluconate 500 MG tablet Take 500 mg by mouth daily.   Cranberry Extract 250 MG Tabs Take 250 mg by mouth daily.   dextroamphetamine 5 MG tablet Commonly known as:  DEXTROSTAT Take 5 mg by mouth daily.   fluticasone 50 MCG/ACT nasal spray Commonly known as:  FLONASE Place 2 sprays into both nostrils daily.   ibuprofen 200 MG tablet Commonly known as:  ADVIL,MOTRIN Take 200 mg by mouth every 6 (six) hours as needed for headache or moderate pain.   lisinopril 10 MG tablet Commonly known as:  PRINIVIL,ZESTRIL Take  10 mg by mouth daily.   loratadine 10 MG tablet Commonly known as:  CLARITIN Take 10 mg by mouth daily as needed for allergies or itching.   Lutein 6 MG Caps Take 6 mg by mouth daily.   Magnesium 300 MG Caps Take 300 mg by mouth daily.   meclizine 25 MG tablet Commonly known as:  ANTIVERT Take 25 mg by mouth 2 (two) times daily as needed for dizziness.   METAMUCIL FIBER PO Take 1 scoop by mouth daily as needed (constipation).   NUCYNTA 50 MG tablet Generic drug:  tapentadol Take 50 mg by mouth every 4 (four) hours as needed for moderate pain.   ondansetron 4 MG tablet Commonly known as:  ZOFRAN Take 4 mg by mouth every 6 (six) hours as needed for nausea or vomiting.   SYSTANE OP Place 1 drop into both eyes 3 (three) times daily.   triamcinolone cream 0.5 % Commonly known as:  KENALOG Apply 1 application topically 2 (two) times daily as needed (rash/ eczema).   vitamin B-12 1000 MCG tablet Commonly known as:  CYANOCOBALAMIN Take 1,000 mcg by mouth daily.   Vitamin D 2000 units Caps Take 5,000 capsules by mouth daily.       Diagnostic Studies: No results found.  They benefited maximally from their hospital stay and there were no complications.     Disposition: 06-Home-Health Care Svc Discharge Instructions    Call MD / Call 911    Complete by:  As directed    If you experience chest pain or shortness of breath, CALL 911 and be transported to the hospital emergency room.  If you develope a fever above 101 F, pus (white drainage) or increased drainage or redness at the wound, or calf pain, call your surgeon's office.   Constipation Prevention    Complete by:  As directed    Drink plenty of fluids.  Prune juice may be helpful.  You may use a stool softener, such as Colace (over the counter) 100 mg twice a day.  Use MiraLax (over the counter) for constipation as needed.   Diet - low sodium heart healthy    Complete by:  As directed    Increase activity slowly as  tolerated    Complete by:  As directed      Follow-up Information    Paulene Floor, MD Follow up in 14 day(s).   Specialty:  Orthopedic Surgery Why:  we will call for your follow up Contact information: [address] [place] [address] [id number]            Signed: Paulene Floor 12/26/2016, 11:25 AM

## 2016-12-26 NOTE — Progress Notes (Signed)
Physical Therapy Treatment Patient Details Name: [name] MRN: [id number] DOB: 11-May-1928 Today's Date: 12/26/2016    History of Present Illness Pt is an 82 y.o. female s/p ORIF L radius wrist fracture and closed reduction of ulna. Pt has a PMG significant for: benign positional vertigo, bursitis (L leg and hip), degenerative joint disease, family history of adverse reaction to anesthesia, hypertension, leg cramps, osteoporosis, pneumonia, post-operative nausea and vomiting, stroke, and varicose veins.    PT Comments    Pt presents with improved tolerance for gait and transfers this session. Pt with improved bed mobs and ability to perform activities with supervision. Pt continues to benefit from [facility] use once returning home and will benefit from PT follow as listed below for balance and gait assistance.    Follow Up Recommendations  Home health PT;Supervision for mobility/OOB     Equipment Recommendations  None recommended by PT    Recommendations for Other Services       Precautions / Restrictions Precautions Precautions: Fall Restrictions Weight Bearing Restrictions: Yes LUE Weight Bearing: Non weight bearing    Mobility  Bed Mobility Overal bed mobility: Needs Assistance Bed Mobility: Supine to Sit;Sit to Supine     Supine to sit: Min guard;HOB elevated Sit to supine: Min guard   General bed mobility comments: Min guard for safety into and out of bed, pt prefers sitting in bed, but will sit EOB   Transfers Overall transfer level: Needs assistance Equipment used: None Transfers: Sit to/from Stand Sit to Stand: Min guard         General transfer comment: Min guard for safety from EOB and from commode  Ambulation/Gait Ambulation/Gait assistance: Min guard Ambulation Distance (Feet): 150 Feet Assistive device: 1 person hand held assist Gait Pattern/deviations: Step-through pattern Gait velocity: decreased Gait velocity interpretation: Below normal  speed for age/gender General Gait Details: Improved cadence and gait distance this session, pt limited by right knee pain    Stairs            Wheelchair Mobility    Modified Rankin (Stroke Patients Only)       Balance Overall balance assessment: Needs assistance Sitting-balance support: No upper extremity supported;Feet supported Sitting balance-Leahy Scale: Fair Sitting balance - Comments: sitting EOB with no back support   Standing balance support: No upper extremity supported Standing balance-Leahy Scale: Fair Standing balance comment: Standing at sink to wash hands                    Cognition Arousal/Alertness: Awake/alert Behavior During Therapy: WFL for tasks assessed/performed Overall Cognitive Status: Within Functional Limits for tasks assessed                      Exercises Other Exercises Other Exercises: reviewed ROM for digits on L hand.  reviewed benefits for edema management.  also application of ice packs and elevation.      General Comments        Pertinent Vitals/Pain Pain Assessment: No/denies pain    Home Living                      Prior Function            PT Goals (current goals can now be found in the care plan section) Acute Rehab PT Goals Patient Stated Goal: to go home and get back to volunteering at church Progress towards PT goals: Progressing toward goals    Frequency  Min 5X/week      PT Plan Current plan remains appropriate    Co-evaluation             End of Session Equipment Utilized During Treatment: Gait belt Activity Tolerance: Patient tolerated treatment well Patient left: in bed;with call bell/phone within reach     Time: 1031-1045 PT Time Calculation (min) (ACUTE ONLY): 14 min  Charges:  $Gait Training: 8-22 mins                    G Codes:      Scheryl Marten PT, DPT  [phone]  12/26/2016, 10:51 AM

## 2016-12-26 NOTE — Discharge Instructions (Signed)

## 2016-12-26 NOTE — Progress Notes (Signed)
Occupational Therapy Treatment Patient Details Name: [name] MRN: [id number] DOB: [birth year] Today's Date: 12/26/2016    History of present illness Pt is an 82 y.o. female s/p ORIF L radius wrist fracture and closed reduction of ulna. Pt has a PMG significant for: benign positional vertigo, bursitis (L leg and hip), degenerative joint disease, family history of adverse reaction to anesthesia, hypertension, leg cramps, osteoporosis, pneumonia, post-operative nausea and vomiting, stroke, and varicose veins.   OT comments  Pt. Declined oob but agreeable to ROM L digits with focus on edema management and elevation of LUE.  Note d/c for later today.  Pt. Reports son will be assisting at home.    Follow Up Recommendations  Home health OT;Supervision - Intermittent    Equipment Recommendations  3 in 1 bedside commode    Recommendations for Other Services      Precautions / Restrictions Precautions Precautions: Fall Restrictions Weight Bearing Restrictions: Yes LUE Weight Bearing: Non weight bearing       Mobility Bed Mobility               General bed mobility comments: declined oob  Transfers                      Balance                                   ADL                                                Vision                     Perception     Praxis      Cognition   Behavior During Therapy: WFL for tasks assessed/performed Overall Cognitive Status: Within Functional Limits for tasks assessed                       Extremity/Trunk Assessment               Exercises Other Exercises Other Exercises: reviewed ROM for digits on L hand.  reviewed benefits for edema management.  also application of ice packs and elevation.     Shoulder Instructions       General Comments      Pertinent Vitals/ Pain       Pain Assessment: No/denies pain  Home Living                                           Prior Functioning/Environment              Frequency  Min 2X/week        Progress Toward Goals  OT Goals(current goals can now be found in the care plan section)  Progress towards OT goals: Progressing toward goals     Plan Discharge plan remains appropriate    Co-evaluation                 End of Session     Activity Tolerance Patient tolerated treatment well   Patient Left in bed;with call bell/phone within reach   Nurse Communication  Time: [id number] OT Time Calculation (min): 10 min  Charges: OT General Charges $OT Visit: 1 Procedure OT Treatments $Therapeutic Exercise: 8-22 mins  Janice Coffin, COTA/L 12/26/2016, 9:44 AM

## 2016-12-27 DIAGNOSIS — S52609D Unspecified fracture of lower end of unspecified ulna, subsequent encounter for closed fracture with routine healing: Secondary | ICD-10-CM | POA: Diagnosis not present

## 2016-12-27 DIAGNOSIS — S52502D Unspecified fracture of the lower end of left radius, subsequent encounter for closed fracture with routine healing: Secondary | ICD-10-CM | POA: Diagnosis not present

## 2016-12-29 DIAGNOSIS — S52609D Unspecified fracture of lower end of unspecified ulna, subsequent encounter for closed fracture with routine healing: Secondary | ICD-10-CM | POA: Diagnosis not present

## 2016-12-29 DIAGNOSIS — S52502D Unspecified fracture of the lower end of left radius, subsequent encounter for closed fracture with routine healing: Secondary | ICD-10-CM | POA: Diagnosis not present

## 2016-12-30 DIAGNOSIS — S52609D Unspecified fracture of lower end of unspecified ulna, subsequent encounter for closed fracture with routine healing: Secondary | ICD-10-CM | POA: Diagnosis not present

## 2016-12-30 DIAGNOSIS — S52502D Unspecified fracture of the lower end of left radius, subsequent encounter for closed fracture with routine healing: Secondary | ICD-10-CM | POA: Diagnosis not present

## 2017-01-01 DIAGNOSIS — S52609D Unspecified fracture of lower end of unspecified ulna, subsequent encounter for closed fracture with routine healing: Secondary | ICD-10-CM | POA: Diagnosis not present

## 2017-01-01 DIAGNOSIS — S52502D Unspecified fracture of the lower end of left radius, subsequent encounter for closed fracture with routine healing: Secondary | ICD-10-CM | POA: Diagnosis not present

## 2017-01-04 DIAGNOSIS — S52502D Unspecified fracture of the lower end of left radius, subsequent encounter for closed fracture with routine healing: Secondary | ICD-10-CM | POA: Diagnosis not present

## 2017-01-04 DIAGNOSIS — S52609D Unspecified fracture of lower end of unspecified ulna, subsequent encounter for closed fracture with routine healing: Secondary | ICD-10-CM | POA: Diagnosis not present

## 2017-01-05 DIAGNOSIS — S52609D Unspecified fracture of lower end of unspecified ulna, subsequent encounter for closed fracture with routine healing: Secondary | ICD-10-CM | POA: Diagnosis not present

## 2017-01-05 DIAGNOSIS — S52502D Unspecified fracture of the lower end of left radius, subsequent encounter for closed fracture with routine healing: Secondary | ICD-10-CM | POA: Diagnosis not present

## 2017-01-06 DIAGNOSIS — S52522D Torus fracture of lower end of left radius, subsequent encounter for fracture with routine healing: Secondary | ICD-10-CM | POA: Diagnosis not present

## 2017-01-06 DIAGNOSIS — Z4789 Encounter for other orthopedic aftercare: Secondary | ICD-10-CM | POA: Diagnosis not present

## 2017-01-07 DIAGNOSIS — S52609D Unspecified fracture of lower end of unspecified ulna, subsequent encounter for closed fracture with routine healing: Secondary | ICD-10-CM | POA: Diagnosis not present

## 2017-01-07 DIAGNOSIS — S52502D Unspecified fracture of the lower end of left radius, subsequent encounter for closed fracture with routine healing: Secondary | ICD-10-CM | POA: Diagnosis not present

## 2017-01-12 DIAGNOSIS — S52502D Unspecified fracture of the lower end of left radius, subsequent encounter for closed fracture with routine healing: Secondary | ICD-10-CM | POA: Diagnosis not present

## 2017-01-12 DIAGNOSIS — S52609D Unspecified fracture of lower end of unspecified ulna, subsequent encounter for closed fracture with routine healing: Secondary | ICD-10-CM | POA: Diagnosis not present

## 2017-01-14 DIAGNOSIS — S52502D Unspecified fracture of the lower end of left radius, subsequent encounter for closed fracture with routine healing: Secondary | ICD-10-CM | POA: Diagnosis not present

## 2017-01-14 DIAGNOSIS — S52609D Unspecified fracture of lower end of unspecified ulna, subsequent encounter for closed fracture with routine healing: Secondary | ICD-10-CM | POA: Diagnosis not present

## 2017-01-20 DIAGNOSIS — S52602D Unspecified fracture of lower end of left ulna, subsequent encounter for closed fracture with routine healing: Secondary | ICD-10-CM | POA: Diagnosis not present

## 2017-01-20 DIAGNOSIS — Z4789 Encounter for other orthopedic aftercare: Secondary | ICD-10-CM | POA: Diagnosis not present

## 2017-01-27 DIAGNOSIS — M25532 Pain in left wrist: Secondary | ICD-10-CM | POA: Diagnosis not present

## 2017-02-03 DIAGNOSIS — M25532 Pain in left wrist: Secondary | ICD-10-CM | POA: Diagnosis not present

## 2017-02-10 DIAGNOSIS — M25532 Pain in left wrist: Secondary | ICD-10-CM | POA: Diagnosis not present

## 2017-02-10 DIAGNOSIS — Z4789 Encounter for other orthopedic aftercare: Secondary | ICD-10-CM | POA: Diagnosis not present

## 2017-02-18 DIAGNOSIS — M25532 Pain in left wrist: Secondary | ICD-10-CM | POA: Diagnosis not present

## 2017-03-03 DIAGNOSIS — G8929 Other chronic pain: Secondary | ICD-10-CM | POA: Diagnosis not present

## 2017-03-03 DIAGNOSIS — M25561 Pain in right knee: Secondary | ICD-10-CM | POA: Diagnosis not present

## 2017-03-03 DIAGNOSIS — M25532 Pain in left wrist: Secondary | ICD-10-CM | POA: Diagnosis not present

## 2017-03-03 DIAGNOSIS — Z4789 Encounter for other orthopedic aftercare: Secondary | ICD-10-CM | POA: Diagnosis not present

## 2017-03-05 DIAGNOSIS — I10 Essential (primary) hypertension: Secondary | ICD-10-CM | POA: Diagnosis not present

## 2017-03-05 DIAGNOSIS — J301 Allergic rhinitis due to pollen: Secondary | ICD-10-CM | POA: Diagnosis not present

## 2017-03-08 NOTE — Addendum Note (Signed)
Addendum  created 03/08/17 0911 by Rica Koyanagi, MD   Delete clinical note

## 2017-03-17 DIAGNOSIS — J301 Allergic rhinitis due to pollen: Secondary | ICD-10-CM | POA: Diagnosis not present

## 2017-03-17 DIAGNOSIS — I10 Essential (primary) hypertension: Secondary | ICD-10-CM | POA: Diagnosis not present

## 2017-03-31 DIAGNOSIS — M25532 Pain in left wrist: Secondary | ICD-10-CM | POA: Diagnosis not present

## 2017-03-31 DIAGNOSIS — Z4789 Encounter for other orthopedic aftercare: Secondary | ICD-10-CM | POA: Diagnosis not present

## 2017-04-01 DIAGNOSIS — R238 Other skin changes: Secondary | ICD-10-CM | POA: Diagnosis not present

## 2017-04-19 DIAGNOSIS — M25561 Pain in right knee: Secondary | ICD-10-CM | POA: Diagnosis not present

## 2017-04-19 DIAGNOSIS — G8929 Other chronic pain: Secondary | ICD-10-CM | POA: Diagnosis not present

## 2017-04-22 ENCOUNTER — Encounter: Payer: Self-pay | Admitting: Nurse Practitioner

## 2017-04-22 ENCOUNTER — Ambulatory Visit (INDEPENDENT_AMBULATORY_CARE_PROVIDER_SITE_OTHER): Payer: Medicare Other | Admitting: Nurse Practitioner

## 2017-04-22 ENCOUNTER — Encounter (INDEPENDENT_AMBULATORY_CARE_PROVIDER_SITE_OTHER): Payer: Self-pay

## 2017-04-22 VITALS — BP 132/74 | HR 103 | Wt 121.8 lb

## 2017-04-22 DIAGNOSIS — I639 Cerebral infarction, unspecified: Secondary | ICD-10-CM

## 2017-04-22 DIAGNOSIS — I10 Essential (primary) hypertension: Secondary | ICD-10-CM | POA: Diagnosis not present

## 2017-04-22 DIAGNOSIS — E785 Hyperlipidemia, unspecified: Secondary | ICD-10-CM | POA: Insufficient documentation

## 2017-04-22 NOTE — Patient Instructions (Addendum)
Stressed the importance of management of risk factors to prevent further stroke Continue aspirin for secondary stroke prevention Maintain strict control of hypertension with blood pressure goal below 130/90, today's reading 132/74 continue Lisinopril Cholesterol with LDL cholesterol less than 70, followed by primary care,   continue Lipitor Exercise by walking,  eat healthy diet with whole grains,  fresh fruits and vegetables Will discharge from stroke clinic  REMEMBER Pneumonic FAST which stands for  F stands  for face drooping and weakness etc. A stands for arms, weakness S stands for speech slurred  T stands for time to call 911  Stroke Prevention Some health problems and behaviors may make it more likely for you to have a stroke. Below are ways to lessen your risk of having a stroke.  Be active for at least 30 minutes on most or all days.  Do not smoke. Try not to be around others who smoke.  Do not drink too much alcohol.  Do not have more than 2 drinks a day if you are a man.  Do not have more than 1 drink a day if you are a woman and are not pregnant.  Eat healthy foods, such as fruits and vegetables. If you were put on a specific diet, follow the diet as told.  Keep your cholesterol levels under control through diet and medicines. Look for foods that are low in saturated fat, trans fat, cholesterol, and are high in fiber.  If you have diabetes, follow all diet plans and take your medicine as told.  Ask your doctor if you need treatment to lower your blood pressure. If you have high blood pressure (hypertension), follow all diet plans and take your medicine as told by your doctor.  If you are 5-38 years old, have your blood pressure checked every 3-5 years. If you are age 35 or older, have your blood pressure checked every year.  Keep a healthy weight. Eat foods that are low in calories, salt, saturated fat, trans fat, and cholesterol.  Do not take drugs.  Avoid birth  control pills, if this applies. Talk to your doctor about the risks of taking birth control pills.  Talk to your doctor if you have sleep problems (sleep apnea).  Take all medicine as told by your doctor.  You may be told to take aspirin or blood thinner medicine. Take this medicine as told by your doctor.  Understand your medicine instructions.  Make sure any other conditions you have are being taken care of. Get help right away if:  You suddenly lose feeling (you feel numb) or have weakness in your face, arm, or leg.  Your face or eyelid hangs down to one side.  You suddenly feel confused.  You have trouble talking (aphasia) or understanding what people are saying.  You suddenly have trouble seeing in one or both eyes.  You suddenly have trouble walking.  You are dizzy.  You lose your balance or your movements are clumsy (uncoordinated).  You suddenly have a very bad headache and you do not know the cause.  You have new chest pain.  Your heart feels like it is fluttering or skipping a beat (irregular heartbeat). Do not wait to see if the symptoms above go away. Get help right away. Call your local emergency services (911 in [place] Do not drive yourself to the hospital. This information is not intended to replace advice given to you by your health care provider. Make sure you discuss any questions you  have with your health care provider. Document Released: 06/14/2012 Document Revised: 05/21/2016 Document Reviewed: 06/16/2013 Elsevier Interactive Patient Education  2017 [employer].

## 2017-04-22 NOTE — Progress Notes (Signed)
[facility]  [name] DOB: 11/14/1928   REASON FOR VISIT: Follow-up for lacunar infarct HISTORY FROM: patient    HISTORY OF PRESENT ILLNESS:UPDATE 4/26/18CM [name], 83 year old female returns for follow-up with history of lacunar infarct in September 2017. She has risk factors of hypertension and hyperlipidemia age and sex. She is currently on aspirin 325 daily for secondary stroke prevention without recurrent stroke or TIA symptoms. She has no bruising and no bleeding. She is on Lipitor for hyperlipidemia without complaints of myalgias. Blood pressure in the office today 132/74. [place], ended up having surgery of her left wrist. She is back to all of her routine activities. She continues to live alone independent in all ADLs. She drives a car without difficulty. She returns for reevaluation  HISTORY: 10/21/16 PS88 year pleasant elderly caucasian lady seen today for the first office follow-up visit following hospital admission for stroke in September 2017. Kimberly J Kirkpatrickis an 83 y.o.femalehistory of hypertension, osteoporosis, degenerative joint disease and benign positional vertigo, brought to the ED for evaluation of onset left facial weakness and slurred speech. Onset was at 3 PM on 09/15/2016. There is no previous history of stroke nor TIA. She's been on aspirin daily. CT scan of her head showed no acute intracranial abnormality. No weakness no numbness involving left extremities. NIH stroke score at the time of this evaluation was 2. Patient was not administered IV t-PA secondary to Beyond time window for treatment consideration. Shewas admitted for further evaluation and treatment. Patient did remarkably well during the hospital stay and showed improvement. MRI scan of the brain showed a small lacunar infarct in the right corona radiata. MRA of the brain showed no large vessel  occlusion or stenosis. Carotid ultrasound showed no significant extracranial stenosis. LDL cholesterol was elevated 125 mg percent. Hemoglobin A1c is 5.7. Transthoracic 6 echo showed normal ejection fraction. Patient was started on aspirin for stroke prevention. She states she has some speech therapy at home but she is recovered and that has been discontinued. Only occasionally she has stuttering particularly when she does report fast. Her blood pressure is usually quite good at home but today it is elevated 153/78 and she blames this on traffic and anxiety from this visit. The patient had trouble tolerating Lipitor and alert severe diarrhea and discontinue it for 2 weeks and is currently back on it on half the dose and tolerating it well. She has not had follow-up lipid profile checked yet. She has no other complaints.    REVIEW OF SYSTEMS: Full 14 system review of systems performed and notable only for those listed, all others are neg:  Constitutional: neg  Cardiovascular: neg Ear/Nose/Throat: neg  Skin: neg Eyes: Allergies  Respiratory: neg Gastroitestinal: neg  Hematology/Lymphatic: Easy bruising  Endocrine: Intolerance to cold Musculoskeletal:neg Allergy/Immunology: neg Neurological: neg Psychiatric: neg Sleep : neg   ALLERGIES: Allergies  Allergen Reactions  . Ciprofloxacin Other (See Comments)    unknown  . Tetanus Toxoids Other (See Comments)    unknown  . Amoxicillin Diarrhea  . Clindamycin/Lincomycin Diarrhea  . Codeine Nausea And Vomiting  . Penicillins Hives, Swelling, Rash and Other (See Comments)    Has patient had a PCN reaction causing immediate rash, facial/tongue/throat swelling, SOB or lightheadedness with hypotension: swelling of arm, and rash Has patient had a PCN reaction causing severe rash involving mucus membranes or skin necrosis: unknown Has patient had a PCN reaction that required  hospitalization : unknown Has patient had a PCN reaction occurring within  the last 10 years: no If all of the above answers are "NO", then may proceed with Cephalosporin use.   Marland Kitchen Ultracet [Tramadol-Acetaminophen] Nausea And Vomiting    HOME MEDICATIONS: Outpatient Medications Prior to Visit  Medication Sig Dispense Refill  . aspirin 325 MG tablet Take 325 mg by mouth daily.    Marland Kitchen atorvastatin (LIPITOR) 10 MG tablet Take 1 tablet (10 mg total) by mouth daily at 6 PM. (Patient taking differently: Take 10 mg by mouth daily at 6 PM. ) 30 tablet 0  . calcium gluconate 500 MG tablet Take 500 mg by mouth daily.      . Cholecalciferol (VITAMIN D) 2000 UNITS CAPS Take 5,000 capsules by mouth daily.     . Cranberry Extract 250 MG TABS Take 250 mg by mouth daily.     Marland Kitchen dextroamphetamine (DEXTROSTAT) 5 MG tablet Take 5 mg by mouth daily.      . fluticasone (FLONASE) 50 MCG/ACT nasal spray Place 2 sprays into both nostrils daily.    Marland Kitchen ibuprofen (ADVIL,MOTRIN) 200 MG tablet Take 200 mg by mouth every 6 (six) hours as needed for headache or moderate pain.     Marland Kitchen lisinopril (PRINIVIL,ZESTRIL) 10 MG tablet Take 10 mg by mouth daily.    Marland Kitchen loratadine (CLARITIN) 10 MG tablet Take 10 mg by mouth daily as needed for allergies or itching.     . Lutein 6 MG CAPS Take 6 mg by mouth daily.      . Magnesium 300 MG CAPS Take 300 mg by mouth daily.     . meclizine (ANTIVERT) 25 MG tablet Take 25 mg by mouth 2 (two) times daily as needed for dizziness.     . ondansetron (ZOFRAN) 4 MG tablet Take 4 mg by mouth every 6 (six) hours as needed for nausea or vomiting.     Vladimir Faster Glycol-Propyl Glycol (SYSTANE OP) Place 1 drop into both eyes 3 (three) times daily.    . Psyllium (METAMUCIL FIBER PO) Take 1 scoop by mouth daily as needed (constipation).    . tapentadol (NUCYNTA) 50 MG tablet Take 50 mg by mouth every 4 (four) hours as needed for moderate pain.     Marland Kitchen triamcinolone cream (KENALOG) 0.5 % Apply 1 application topically 2 (two) times daily as needed (rash/ eczema).    . vitamin B-12  (CYANOCOBALAMIN) 1000 MCG tablet Take 1,000 mcg by mouth daily.       No facility-administered medications prior to visit.     PAST MEDICAL HISTORY: Past Medical History:  Diagnosis Date  . Benign positional vertigo   . Bursitis started 10-2011   left leg and left hip  . Clostridium difficile diarrhea    hx of 10 years ago  . DJD (degenerative joint disease)   . Family history of adverse reaction to anesthesia    daughter has nausea  . Hypertension   . Leg cramps   . Osteoporosis   . Pneumonia    as a baby  . PONV (postoperative nausea and vomiting)   . Stroke ([place])   . Varicose veins     PAST SURGICAL HISTORY: Past Surgical History:  Procedure Laterality Date  . ABDOMINAL HYSTERECTOMY    . APPENDECTOMY    . CATARACT EXTRACTION     right  . COLONOSCOPY    . HERNIA REPAIR    . KYPHOSIS SURGERY    . ORIF WRIST FRACTURE Left 12/24/2016  Procedure: OPEN REDUCTION INTERNAL FIXATION (ORIF) LEFT WRIST FRACTURE;  Surgeon: Roseanne Kaufman, MD;  Location: [facility];  Service: Orthopedics;  Laterality: Left;  . TUBAL LIGATION     bilateral    FAMILY HISTORY: Family History  Problem Relation Age of Onset  . Alzheimer's disease Mother   . Stroke Mother   . Coronary artery disease Father   . Ulcers Father     SOCIAL HISTORY: Social History   Social History  . Marital status: Widowed    Spouse name: N/A  . Number of children: 3  . Years of education: 12   Occupational History  .      worked with husband   Social History Main Topics  . Smoking status: Never Smoker  . Smokeless tobacco: Never Used  . Alcohol use No  . Drug use: No  . Sexual activity: Not on file   Other Topics Concern  . Not on file   Social History Narrative   Lives alone      PHYSICAL EXAM  Vitals:   04/22/17 0938  BP: 132/74  Pulse: (!) 103  Weight: 121 lb 12.8 oz (55.2 kg)   Body mass index is 24.6 kg/m.  Generalized: Well developed, in no acute distress  Head: normocephalic  and atraumatic,. Oropharynx benign  Neck: Supple, no carotid bruits  Cardiac: Regular rate rhythm, no murmur  Musculoskeletal: No deformity   Neurological examination   Mentation: Alert oriented to time, place, history taking. Attention span and concentration appropriate. Recent and remote memory intact.  Follows all commands speech and language fluent.   Cranial nerve II-XII: Pupils were equal round reactive to light extraocular movements were full, visual field were full on confrontational test. Facial sensation and strength were normal. hearing was intact to finger rubbing bilaterally. Uvula tongue midline. head turning and shoulder shrug were normal and symmetric.Tongue protrusion into cheek strength was normal. Motor: normal bulk and tone, full strength in the BUE, BLE, fine finger movements normal, no pronator drift. No focal weakness Sensory: normal and symmetric to light touch, pinprick, and  Vibration, In the upper and lower extremities  Coordination: finger-nose-finger, heel-to-shin bilaterally, no dysmetria Reflexes: 1+ upper lower and symmetric plantar responses were flexor bilaterally. Gait and Station: Rising up from seated position without assistance, normal stance,  moderate stride, good arm swing, smooth turning, able to perform tiptoe, and heel walking without difficulty. Tandem gait is mildly unsteady. No assistive device  DIAGNOSTIC DATA (LABS, IMAGING, TESTING) - I reviewed patient records, labs, notes, testing and imaging myself where available.  Lab Results  Component Value Date   WBC 9.5 12/24/2016   HGB 11.3 (L) 12/24/2016   HCT 35.3 (L) 12/24/2016   MCV 87.2 12/24/2016   PLT 278 12/24/2016      Component Value Date/Time   NA 129 (L) 12/24/2016 1711   K 3.6 12/24/2016 1711   CL 97 (L) 12/24/2016 1711   CO2 26 12/24/2016 1711   GLUCOSE 104 (H) 12/24/2016 1711   BUN 10 12/24/2016 1711   CREATININE 0.78 12/24/2016 1711   CALCIUM 8.7 (L) 12/24/2016 1711    PROT 7.2 09/16/2016 1639   ALBUMIN 4.1 09/16/2016 1639   AST 22 09/16/2016 1639   ALT 16 09/16/2016 1639   ALKPHOS 51 09/16/2016 1639   BILITOT 0.4 09/16/2016 1639   GFRNONAA >60 12/24/2016 1711   GFRAA >60 12/24/2016 1711   Lab Results  Component Value Date   CHOL 211 (H) 09/18/2016   HDL 64 09/18/2016  LDLCALC 125 (H) 09/18/2016   TRIG 108 09/18/2016   CHOLHDL 3.3 09/18/2016   Lab Results  Component Value Date   HGBA1C 5.7 (H) 09/18/2016   Lab Results  Component Value Date   BTDVVOHY07 371 10/15/2007     ASSESSMENT AND PLAN  83 y.o. year old female  with right subcortical lacunar infarct in September 2017 secondary to small vessel disease with vascular risk factors of hypertension, hyperlipidemia and age. She is doing clinically quite well with no residual deficits.  PLAN: Stressed the importance of management of risk factors to prevent further stroke Continue aspirin for secondary stroke prevention Maintain strict control of hypertension with blood pressure goal below 130/90, today's reading 132/74 continue Lisinopril Cholesterol with LDL cholesterol less than 70, followed by primary care,   continue Lipitor Exercise by walking,  eat healthy diet with whole grains,  fresh fruits and vegetables Will discharge from stroke clinic  REMEMBER Pneumonic FAST which stands for  F stands  for face drooping and weakness etc. A stands for arms, weakness S stands for speech slurred  T stands for time to call 911 This was a  visit requiring 25 minutes  with extensive review of history, hospital chart, counseling and answering questions, planning and further management of risk factors to be done by primary care Dr. Felipa Eth. Reviewed the importance of risk factor management and stroke prevention with the patient. Also given written copy for reference Dennie Bible, [facility], [facility], APRN  [facility] [address], [place], [place] [address] [phone]

## 2017-04-24 NOTE — Progress Notes (Signed)
I agree with the above plan 

## 2017-04-27 DIAGNOSIS — M1711 Unilateral primary osteoarthritis, right knee: Secondary | ICD-10-CM | POA: Diagnosis not present

## 2017-05-05 DIAGNOSIS — H903 Sensorineural hearing loss, bilateral: Secondary | ICD-10-CM | POA: Diagnosis not present

## 2017-05-05 DIAGNOSIS — H9313 Tinnitus, bilateral: Secondary | ICD-10-CM | POA: Diagnosis not present

## 2017-05-12 DIAGNOSIS — M1711 Unilateral primary osteoarthritis, right knee: Secondary | ICD-10-CM | POA: Diagnosis not present

## 2017-05-13 ENCOUNTER — Other Ambulatory Visit: Payer: Self-pay | Admitting: Otolaryngology

## 2017-05-13 DIAGNOSIS — H903 Sensorineural hearing loss, bilateral: Secondary | ICD-10-CM

## 2017-05-13 DIAGNOSIS — H9313 Tinnitus, bilateral: Secondary | ICD-10-CM

## 2017-05-19 DIAGNOSIS — M1711 Unilateral primary osteoarthritis, right knee: Secondary | ICD-10-CM | POA: Diagnosis not present

## 2017-05-26 DIAGNOSIS — M1711 Unilateral primary osteoarthritis, right knee: Secondary | ICD-10-CM | POA: Diagnosis not present

## 2017-05-31 ENCOUNTER — Ambulatory Visit
Admission: RE | Admit: 2017-05-31 | Discharge: 2017-05-31 | Disposition: A | Discharge status: Home / Self Care | Payer: Medicare Other | Source: Ambulatory Visit | Attending: Otolaryngology | Admitting: Otolaryngology

## 2017-05-31 DIAGNOSIS — H903 Sensorineural hearing loss, bilateral: Secondary | ICD-10-CM

## 2017-05-31 DIAGNOSIS — H9041 Sensorineural hearing loss, unilateral, right ear, with unrestricted hearing on the contralateral side: Secondary | ICD-10-CM | POA: Diagnosis not present

## 2017-05-31 DIAGNOSIS — H9313 Tinnitus, bilateral: Secondary | ICD-10-CM

## 2017-05-31 MED ORDER — GADOBENATE DIMEGLUMINE 529 MG/ML IV SOLN
10.0000 mL | Freq: Once | INTRAVENOUS | Status: AC | PRN
  Administered 2017-05-31: 10 mL via INTRAVENOUS

## 2017-06-18 DIAGNOSIS — I6350 Cerebral infarction due to unspecified occlusion or stenosis of unspecified cerebral artery: Secondary | ICD-10-CM | POA: Diagnosis not present

## 2017-06-18 DIAGNOSIS — I10 Essential (primary) hypertension: Secondary | ICD-10-CM | POA: Diagnosis not present

## 2017-06-18 DIAGNOSIS — M810 Age-related osteoporosis without current pathological fracture: Secondary | ICD-10-CM | POA: Diagnosis not present

## 2017-06-21 DIAGNOSIS — I10 Essential (primary) hypertension: Secondary | ICD-10-CM | POA: Diagnosis not present

## 2017-06-21 DIAGNOSIS — Z1389 Encounter for screening for other disorder: Secondary | ICD-10-CM | POA: Diagnosis not present

## 2017-06-21 DIAGNOSIS — J301 Allergic rhinitis due to pollen: Secondary | ICD-10-CM | POA: Diagnosis not present

## 2017-06-21 DIAGNOSIS — Z0000 Encounter for general adult medical examination without abnormal findings: Secondary | ICD-10-CM | POA: Diagnosis not present

## 2017-06-21 DIAGNOSIS — H8101 Meniere's disease, right ear: Secondary | ICD-10-CM | POA: Diagnosis not present

## 2017-07-08 DIAGNOSIS — H903 Sensorineural hearing loss, bilateral: Secondary | ICD-10-CM | POA: Diagnosis not present

## 2017-07-08 DIAGNOSIS — H9313 Tinnitus, bilateral: Secondary | ICD-10-CM | POA: Diagnosis not present

## 2017-07-21 ENCOUNTER — Other Ambulatory Visit: Payer: Self-pay | Admitting: Nurse Practitioner

## 2017-07-21 ENCOUNTER — Ambulatory Visit
Admission: RE | Admit: 2017-07-21 | Discharge: 2017-07-21 | Disposition: A | Discharge status: Home / Self Care | Payer: Medicare Other | Source: Ambulatory Visit | Attending: Nurse Practitioner | Admitting: Nurse Practitioner

## 2017-07-21 DIAGNOSIS — M79675 Pain in left toe(s): Secondary | ICD-10-CM | POA: Diagnosis not present

## 2017-07-21 DIAGNOSIS — S92513D Displaced fracture of proximal phalanx of unspecified lesser toe(s), subsequent encounter for fracture with routine healing: Secondary | ICD-10-CM | POA: Diagnosis not present

## 2017-07-23 DIAGNOSIS — L239 Allergic contact dermatitis, unspecified cause: Secondary | ICD-10-CM | POA: Diagnosis not present

## 2017-08-02 ENCOUNTER — Encounter (HOSPITAL_COMMUNITY): Payer: Self-pay | Admitting: Emergency Medicine

## 2017-08-02 ENCOUNTER — Emergency Department (HOSPITAL_COMMUNITY): Payer: Medicare Other

## 2017-08-02 ENCOUNTER — Inpatient Hospital Stay (HOSPITAL_COMMUNITY): Payer: Medicare Other

## 2017-08-02 ENCOUNTER — Inpatient Hospital Stay (HOSPITAL_COMMUNITY)
Admission: EM | Admit: 2017-08-02 | Discharge: 2017-08-06 | DRG: 083 | Disposition: A | Discharge status: Skilled Nursing Facility | Payer: Medicare Other | Attending: Emergency Medicine | Admitting: Emergency Medicine

## 2017-08-02 DIAGNOSIS — S0240DA Maxillary fracture, left side, initial encounter for closed fracture: Secondary | ICD-10-CM | POA: Diagnosis present

## 2017-08-02 DIAGNOSIS — Z880 Allergy status to penicillin: Secondary | ICD-10-CM | POA: Diagnosis not present

## 2017-08-02 DIAGNOSIS — S06360A Traumatic hemorrhage of cerebrum, unspecified, without loss of consciousness, initial encounter: Secondary | ICD-10-CM | POA: Diagnosis not present

## 2017-08-02 DIAGNOSIS — S61213A Laceration without foreign body of left middle finger without damage to nail, initial encounter: Secondary | ICD-10-CM | POA: Diagnosis present

## 2017-08-02 DIAGNOSIS — Z888 Allergy status to other drugs, medicaments and biological substances status: Secondary | ICD-10-CM

## 2017-08-02 DIAGNOSIS — S199XXA Unspecified injury of neck, initial encounter: Secondary | ICD-10-CM | POA: Diagnosis not present

## 2017-08-02 DIAGNOSIS — I629 Nontraumatic intracranial hemorrhage, unspecified: Secondary | ICD-10-CM

## 2017-08-02 DIAGNOSIS — M810 Age-related osteoporosis without current pathological fracture: Secondary | ICD-10-CM | POA: Diagnosis present

## 2017-08-02 DIAGNOSIS — S062X0A Diffuse traumatic brain injury without loss of consciousness, initial encounter: Secondary | ICD-10-CM

## 2017-08-02 DIAGNOSIS — M4302 Spondylolysis, cervical region: Secondary | ICD-10-CM | POA: Diagnosis not present

## 2017-08-02 DIAGNOSIS — S42301A Unspecified fracture of shaft of humerus, right arm, initial encounter for closed fracture: Secondary | ICD-10-CM

## 2017-08-02 DIAGNOSIS — M79641 Pain in right hand: Secondary | ICD-10-CM | POA: Diagnosis not present

## 2017-08-02 DIAGNOSIS — M7989 Other specified soft tissue disorders: Secondary | ICD-10-CM | POA: Diagnosis not present

## 2017-08-02 DIAGNOSIS — R55 Syncope and collapse: Secondary | ICD-10-CM | POA: Diagnosis not present

## 2017-08-02 DIAGNOSIS — M62838 Other muscle spasm: Secondary | ICD-10-CM | POA: Diagnosis not present

## 2017-08-02 DIAGNOSIS — M129 Arthropathy, unspecified: Secondary | ICD-10-CM | POA: Diagnosis not present

## 2017-08-02 DIAGNOSIS — Z7982 Long term (current) use of aspirin: Secondary | ICD-10-CM

## 2017-08-02 DIAGNOSIS — Z823 Family history of stroke: Secondary | ICD-10-CM | POA: Diagnosis not present

## 2017-08-02 DIAGNOSIS — K5900 Constipation, unspecified: Secondary | ICD-10-CM | POA: Diagnosis not present

## 2017-08-02 DIAGNOSIS — Z8249 Family history of ischemic heart disease and other diseases of the circulatory system: Secondary | ICD-10-CM

## 2017-08-02 DIAGNOSIS — R296 Repeated falls: Secondary | ICD-10-CM | POA: Diagnosis not present

## 2017-08-02 DIAGNOSIS — R278 Other lack of coordination: Secondary | ICD-10-CM | POA: Diagnosis not present

## 2017-08-02 DIAGNOSIS — R404 Transient alteration of awareness: Secondary | ICD-10-CM | POA: Diagnosis not present

## 2017-08-02 DIAGNOSIS — S42201D Unspecified fracture of upper end of right humerus, subsequent encounter for fracture with routine healing: Secondary | ICD-10-CM | POA: Diagnosis not present

## 2017-08-02 DIAGNOSIS — S01112A Laceration without foreign body of left eyelid and periocular area, initial encounter: Secondary | ICD-10-CM | POA: Diagnosis present

## 2017-08-02 DIAGNOSIS — S42301D Unspecified fracture of shaft of humerus, right arm, subsequent encounter for fracture with routine healing: Secondary | ICD-10-CM | POA: Diagnosis not present

## 2017-08-02 DIAGNOSIS — R42 Dizziness and giddiness: Secondary | ICD-10-CM | POA: Diagnosis not present

## 2017-08-02 DIAGNOSIS — Z9181 History of falling: Secondary | ICD-10-CM | POA: Diagnosis not present

## 2017-08-02 DIAGNOSIS — S42201A Unspecified fracture of upper end of right humerus, initial encounter for closed fracture: Secondary | ICD-10-CM

## 2017-08-02 DIAGNOSIS — R2681 Unsteadiness on feet: Secondary | ICD-10-CM | POA: Diagnosis not present

## 2017-08-02 DIAGNOSIS — Z79899 Other long term (current) drug therapy: Secondary | ICD-10-CM

## 2017-08-02 DIAGNOSIS — S02402A Zygomatic fracture, unspecified, initial encounter for closed fracture: Secondary | ICD-10-CM | POA: Diagnosis not present

## 2017-08-02 DIAGNOSIS — Z7951 Long term (current) use of inhaled steroids: Secondary | ICD-10-CM

## 2017-08-02 DIAGNOSIS — M6281 Muscle weakness (generalized): Secondary | ICD-10-CM | POA: Diagnosis not present

## 2017-08-02 DIAGNOSIS — I69392 Facial weakness following cerebral infarction: Secondary | ICD-10-CM | POA: Diagnosis not present

## 2017-08-02 DIAGNOSIS — I10 Essential (primary) hypertension: Secondary | ICD-10-CM | POA: Diagnosis present

## 2017-08-02 DIAGNOSIS — Z90710 Acquired absence of both cervix and uterus: Secondary | ICD-10-CM | POA: Diagnosis not present

## 2017-08-02 DIAGNOSIS — R51 Headache: Secondary | ICD-10-CM | POA: Diagnosis not present

## 2017-08-02 DIAGNOSIS — S6990XA Unspecified injury of unspecified wrist, hand and finger(s), initial encounter: Secondary | ICD-10-CM | POA: Diagnosis not present

## 2017-08-02 DIAGNOSIS — S0282XA Fracture of other specified skull and facial bones, left side, initial encounter for closed fracture: Secondary | ICD-10-CM | POA: Diagnosis not present

## 2017-08-02 DIAGNOSIS — S42309A Unspecified fracture of shaft of humerus, unspecified arm, initial encounter for closed fracture: Secondary | ICD-10-CM

## 2017-08-02 DIAGNOSIS — R102 Pelvic and perineal pain: Secondary | ICD-10-CM | POA: Diagnosis not present

## 2017-08-02 DIAGNOSIS — S06359A Traumatic hemorrhage of left cerebrum with loss of consciousness of unspecified duration, initial encounter: Secondary | ICD-10-CM | POA: Diagnosis present

## 2017-08-02 DIAGNOSIS — Z972 Presence of dental prosthetic device (complete) (partial): Secondary | ICD-10-CM | POA: Diagnosis not present

## 2017-08-02 DIAGNOSIS — S0232XA Fracture of orbital floor, left side, initial encounter for closed fracture: Secondary | ICD-10-CM | POA: Diagnosis present

## 2017-08-02 DIAGNOSIS — Z881 Allergy status to other antibiotic agents status: Secondary | ICD-10-CM | POA: Diagnosis not present

## 2017-08-02 DIAGNOSIS — S6991XA Unspecified injury of right wrist, hand and finger(s), initial encounter: Secondary | ICD-10-CM | POA: Diagnosis not present

## 2017-08-02 DIAGNOSIS — I69328 Other speech and language deficits following cerebral infarction: Secondary | ICD-10-CM

## 2017-08-02 DIAGNOSIS — R41841 Cognitive communication deficit: Secondary | ICD-10-CM | POA: Diagnosis not present

## 2017-08-02 DIAGNOSIS — S42291A Other displaced fracture of upper end of right humerus, initial encounter for closed fracture: Secondary | ICD-10-CM | POA: Diagnosis not present

## 2017-08-02 DIAGNOSIS — Z23 Encounter for immunization: Secondary | ICD-10-CM | POA: Diagnosis not present

## 2017-08-02 DIAGNOSIS — S0990XA Unspecified injury of head, initial encounter: Secondary | ICD-10-CM | POA: Diagnosis not present

## 2017-08-02 DIAGNOSIS — S0240FA Zygomatic fracture, left side, initial encounter for closed fracture: Secondary | ICD-10-CM | POA: Diagnosis present

## 2017-08-02 DIAGNOSIS — S0292XA Unspecified fracture of facial bones, initial encounter for closed fracture: Secondary | ICD-10-CM

## 2017-08-02 DIAGNOSIS — S062X0D Diffuse traumatic brain injury without loss of consciousness, subsequent encounter: Secondary | ICD-10-CM | POA: Diagnosis not present

## 2017-08-02 DIAGNOSIS — S42491A Other displaced fracture of lower end of right humerus, initial encounter for closed fracture: Secondary | ICD-10-CM | POA: Diagnosis not present

## 2017-08-02 DIAGNOSIS — M8580 Other specified disorders of bone density and structure, unspecified site: Secondary | ICD-10-CM | POA: Diagnosis not present

## 2017-08-02 DIAGNOSIS — S42351A Displaced comminuted fracture of shaft of humerus, right arm, initial encounter for closed fracture: Secondary | ICD-10-CM | POA: Diagnosis present

## 2017-08-02 DIAGNOSIS — E785 Hyperlipidemia, unspecified: Secondary | ICD-10-CM | POA: Diagnosis present

## 2017-08-02 DIAGNOSIS — W19XXXA Unspecified fall, initial encounter: Secondary | ICD-10-CM

## 2017-08-02 DIAGNOSIS — I611 Nontraumatic intracerebral hemorrhage in hemisphere, cortical: Secondary | ICD-10-CM | POA: Diagnosis not present

## 2017-08-02 DIAGNOSIS — W1830XA Fall on same level, unspecified, initial encounter: Secondary | ICD-10-CM | POA: Diagnosis present

## 2017-08-02 DIAGNOSIS — W1789XA Other fall from one level to another, initial encounter: Secondary | ICD-10-CM | POA: Diagnosis not present

## 2017-08-02 DIAGNOSIS — S02401A Maxillary fracture, unspecified, initial encounter for closed fracture: Secondary | ICD-10-CM | POA: Diagnosis not present

## 2017-08-02 DIAGNOSIS — M50322 Other cervical disc degeneration at C5-C6 level: Secondary | ICD-10-CM | POA: Diagnosis not present

## 2017-08-02 DIAGNOSIS — S06330A Contusion and laceration of cerebrum, unspecified, without loss of consciousness, initial encounter: Secondary | ICD-10-CM | POA: Diagnosis not present

## 2017-08-02 LAB — URINALYSIS, ROUTINE W REFLEX MICROSCOPIC
BILIRUBIN URINE: NEGATIVE
GLUCOSE, UA: NEGATIVE mg/dL
HGB URINE DIPSTICK: NEGATIVE
Ketones, ur: 5 mg/dL — AB
NITRITE: NEGATIVE
PROTEIN: NEGATIVE mg/dL
Specific Gravity, Urine: 1.011 (ref 1.005–1.030)
pH: 7 (ref 5.0–8.0)

## 2017-08-02 LAB — CBC
HEMATOCRIT: 28.6 % — AB (ref 36.0–46.0)
HEMOGLOBIN: 9.3 g/dL — AB (ref 12.0–15.0)
MCH: 27.8 pg (ref 26.0–34.0)
MCHC: 32.5 g/dL (ref 30.0–36.0)
MCV: 85.6 fL (ref 78.0–100.0)
Platelets: 231 10*3/uL (ref 150–400)
RBC: 3.34 MIL/uL — AB (ref 3.87–5.11)
RDW: 14.4 % (ref 11.5–15.5)
WBC: 13.5 10*3/uL — AB (ref 4.0–10.5)

## 2017-08-02 LAB — BASIC METABOLIC PANEL
ANION GAP: 9 (ref 5–15)
BUN: 15 mg/dL (ref 6–20)
CALCIUM: 8.2 mg/dL — AB (ref 8.9–10.3)
CO2: 20 mmol/L — ABNORMAL LOW (ref 22–32)
Chloride: 105 mmol/L (ref 101–111)
Creatinine, Ser: 0.73 mg/dL (ref 0.44–1.00)
Glucose, Bld: 104 mg/dL — ABNORMAL HIGH (ref 65–99)
Potassium: 4 mmol/L (ref 3.5–5.1)
Sodium: 134 mmol/L — ABNORMAL LOW (ref 135–145)

## 2017-08-02 LAB — CBG MONITORING, ED: Glucose-Capillary: 137 mg/dL — ABNORMAL HIGH (ref 65–99)

## 2017-08-02 MED ORDER — BISACODYL 10 MG RE SUPP: 10.0000 mg | Freq: Every day | RECTAL | Status: DC | PRN

## 2017-08-02 MED ORDER — ONDANSETRON HCL 4 MG/2ML IJ SOLN
4.0000 mg | Freq: Once | INTRAMUSCULAR | Status: AC
  Administered 2017-08-02: 4 mg via INTRAVENOUS
  Filled 2017-08-02: qty 2

## 2017-08-02 MED ORDER — FENTANYL CITRATE (PF) 100 MCG/2ML IJ SOLN
100.0000 ug | Freq: Once | INTRAMUSCULAR | Status: AC
  Administered 2017-08-02: 100 ug via INTRAVENOUS
  Filled 2017-08-02: qty 2

## 2017-08-02 MED ORDER — ONDANSETRON HCL 4 MG/2ML IJ SOLN
4.0000 mg | Freq: Four times a day (QID) | INTRAMUSCULAR | Status: DC | PRN
  Administered 2017-08-02 – 2017-08-03 (×2): 4 mg via INTRAVENOUS
  Filled 2017-08-02 (×2): qty 2

## 2017-08-02 MED ORDER — ZOLPIDEM TARTRATE 5 MG PO TABS
5.0000 mg | ORAL_TABLET | Freq: Every evening | ORAL | Status: DC | PRN
  Administered 2017-08-03 – 2017-08-05 (×3): 5 mg via ORAL
  Filled 2017-08-02 (×4): qty 1

## 2017-08-02 MED ORDER — FENTANYL CITRATE (PF) 100 MCG/2ML IJ SOLN
25.0000 ug | INTRAMUSCULAR | Status: DC | PRN
  Administered 2017-08-02 – 2017-08-04 (×9): 25 ug via INTRAVENOUS
  Filled 2017-08-02 (×10): qty 2

## 2017-08-02 MED ORDER — SODIUM CHLORIDE 0.9 % IV SOLN
INTRAVENOUS | Status: DC
  Administered 2017-08-02: 20:00:00 via INTRAVENOUS

## 2017-08-02 MED ORDER — METOPROLOL TARTRATE 5 MG/5ML IV SOLN: 5.0000 mg | Freq: Four times a day (QID) | INTRAVENOUS | Status: DC | PRN

## 2017-08-02 MED ORDER — SODIUM CHLORIDE 0.9 % IV SOLN
INTRAVENOUS | Status: DC
  Administered 2017-08-02: 125 mL/h via INTRAVENOUS
  Administered 2017-08-03 – 2017-08-04 (×2): via INTRAVENOUS

## 2017-08-02 MED ORDER — FENTANYL CITRATE (PF) 100 MCG/2ML IJ SOLN
50.0000 ug | Freq: Once | INTRAMUSCULAR | Status: AC
  Administered 2017-08-02: 50 ug via INTRAVENOUS
  Filled 2017-08-02: qty 2

## 2017-08-02 MED ORDER — SODIUM CHLORIDE 0.9 % IV BOLUS (SEPSIS)
500.0000 mL | Freq: Once | INTRAVENOUS | Status: AC
  Administered 2017-08-02: 500 mL via INTRAVENOUS

## 2017-08-02 MED ORDER — PANTOPRAZOLE SODIUM 40 MG IV SOLR
40.0000 mg | Freq: Every day | INTRAVENOUS | Status: DC
  Administered 2017-08-02: 40 mg via INTRAVENOUS
  Filled 2017-08-02: qty 40

## 2017-08-02 MED ORDER — TETANUS-DIPHTH-ACELL PERTUSSIS 5-2.5-18.5 LF-MCG/0.5 IM SUSP
0.5000 mL | Freq: Once | INTRAMUSCULAR | Status: AC
  Administered 2017-08-02: 0.5 mL via INTRAMUSCULAR
  Filled 2017-08-02: qty 0.5

## 2017-08-02 MED ORDER — LIDOCAINE HCL (PF) 1 % IJ SOLN
5.0000 mL | Freq: Once | INTRAMUSCULAR | Status: AC
  Administered 2017-08-02: 5 mL via INTRADERMAL
  Filled 2017-08-02: qty 5

## 2017-08-02 MED ORDER — SALINE SPRAY 0.65 % NA SOLN
4.0000 | NASAL | Status: DC | PRN
  Administered 2017-08-04 (×4): 4 via NASAL
  Filled 2017-08-02: qty 44

## 2017-08-02 MED ORDER — ONDANSETRON 4 MG PO TBDP
4.0000 mg | ORAL_TABLET | Freq: Four times a day (QID) | ORAL | Status: DC | PRN
  Administered 2017-08-03: 4 mg via ORAL
  Filled 2017-08-02: qty 1

## 2017-08-02 MED ORDER — PANTOPRAZOLE SODIUM 40 MG PO TBEC: 40.0000 mg | DELAYED_RELEASE_TABLET | Freq: Every day | ORAL | Status: DC

## 2017-08-02 NOTE — ED Notes (Signed)
PA at bedside suturing lac to L finger

## 2017-08-02 NOTE — ED Notes (Signed)
Ortho tech paged for supplies for arm splint, this RN is not able to collect labs, phlebotomy to draw labs

## 2017-08-02 NOTE — ED Provider Notes (Signed)
[facility] DEPT Provider Note   CSN: [id number] Arrival date & time: 08/02/17  1113     History   Chief Complaint Chief Complaint  Patient presents with  . Loss of Consciousness    HPI [name] is a 83 y.o. female.  She is here for evaluation of several falls.  The first fall was yesterday morning, while she was at church.  At that time she injured her right upper arm and right hip.  After that fall she was able to walk and move without problems.  This morning, her daughter talked to her on the phone, and the patient stated that she needed to go to the hospital by ambulance.  The daughter came to the patient's house, and found her lying on the side of her bed, with atrial of blood from the bathroom to that site by her bed.  Her daughter surmises that she had fallen twice this morning.  She reinjured the right shoulder, and is now unable to move it, this morning.  Also she has bruising of her face, which is new.  The patient cannot recall why she fell the first time.  It is unclear if anything else happened, since her daughter last saw her yesterday.  The patient complains of pain in the face in the right shoulder only.  She denies recent illnesses.  There are no other known modifying factors.  HPI  Past Medical History:  Diagnosis Date  . Benign positional vertigo   . Bursitis started 10-2011   left leg and left hip  . Clostridium difficile diarrhea    hx of 10 years ago  . DJD (degenerative joint disease)   . Family history of adverse reaction to anesthesia    daughter has nausea  . Hypertension   . Leg cramps   . Osteoporosis   . Pneumonia    as a baby  . PONV (postoperative nausea and vomiting)   . Stroke ([place])   . Varicose veins     Patient Active Problem List   Diagnosis Date Noted  . Intracranial hemorrhage ([place]) 08/02/2017  . Hyperlipemia 04/22/2017  . Radius and ulna distal fracture, left, closed, initial encounter 12/24/2016  . Cerebral  thrombosis with cerebral infarction 09/17/2016  . Acute CVA (cerebrovascular accident) ([place]) 09/17/2016  . CVA (cerebral infarction) 09/16/2016  . Facial droop-left 09/16/2016  . Slurred speech 09/16/2016  . Hypertension   . Benign positional vertigo   . Essential hypertension   . Varicose veins of lower extremities with other complications 35/00/9381  . Pain in limb 01/06/2012    Past Surgical History:  Procedure Laterality Date  . ABDOMINAL HYSTERECTOMY    . APPENDECTOMY    . CATARACT EXTRACTION     right  . COLONOSCOPY    . HERNIA REPAIR    . KYPHOSIS SURGERY    . ORIF WRIST FRACTURE Left 12/24/2016   Procedure: OPEN REDUCTION INTERNAL FIXATION (ORIF) LEFT WRIST FRACTURE;  Surgeon: Roseanne Kaufman, MD;  Location: [facility];  Service: Orthopedics;  Laterality: Left;  . TUBAL LIGATION     bilateral    OB History    No data available       Home Medications    Prior to Admission medications   Medication Sig Start Date End Date Taking? Authorizing Provider  aspirin 325 MG tablet Take 325 mg by mouth daily.   Yes [provider]  calcium carbonate (TUMS EX) 750 MG chewable tablet Chew 2 tablets by mouth daily.  Yes [provider]  Cholecalciferol (VITAMIN D-3) 5000 units TABS Take 5,000 Units by mouth daily.   Yes [provider]  dextroamphetamine (DEXTROSTAT) 5 MG tablet Take 5 mg by mouth daily.     Yes [provider]  fluticasone (FLONASE) 50 MCG/ACT nasal spray Place 2 sprays into both nostrils daily.   Yes [provider]  ibuprofen (ADVIL,MOTRIN) 200 MG tablet Take 400-800 mg by mouth every 6 (six) hours as needed for headache or moderate pain.    Yes [provider]  levocetirizine (XYZAL) 5 MG tablet Take 5 mg by mouth daily.   Yes [provider]  lisinopril (PRINIVIL,ZESTRIL) 10 MG tablet Take 10 mg by mouth daily.   Yes [provider]  Lutein 6 MG CAPS Take 6 mg by mouth daily.     Yes  [provider]  Magnesium 300 MG CAPS Take 300 mg by mouth daily.    Yes [provider]  meclizine (ANTIVERT) 25 MG tablet Take 25 mg by mouth 2 (two) times daily as needed for dizziness.    Yes [provider]  montelukast (SINGULAIR) 10 MG tablet Take 10 mg by mouth daily.   Yes [provider]  Multiple Vitamins-Minerals (HAIR/SKIN/NAILS/BIOTIN) TABS Take 1 tablet by mouth daily.   Yes [provider]  mupirocin ointment (BACTROBAN) 2 % Apply 1 application topically 3 (three) times daily as needed (wound care/ itching).   Yes [provider]  Polyethyl Glycol-Propyl Glycol (SYSTANE OP) Place 1 drop into both eyes 3 (three) times daily.   Yes [provider]  Probiotic Product (ALIGN) 4 MG CAPS Take 4 mg by mouth daily.   Yes [provider]  Psyllium (METAMUCIL FIBER PO) Take 1 scoop by mouth daily as needed (constipation).   Yes [provider]  simvastatin (ZOCOR) 10 MG tablet Take 10 mg by mouth every evening.   Yes [provider]  triamcinolone cream (KENALOG) 0.5 % Apply 1 application topically 2 (two) times daily as needed (rash/ eczema).   Yes [provider]  vitamin B-12 (CYANOCOBALAMIN) 1000 MCG tablet Take 1,000 mcg by mouth daily.     Yes [provider]  atorvastatin (LIPITOR) 10 MG tablet Take 1 tablet (10 mg total) by mouth daily at 6 PM. Patient not taking: Reported on 08/02/2017 09/18/16   Robbie Lis, MD    Family History Family History  Problem Relation Age of Onset  . Alzheimer's disease Mother   . Stroke Mother   . Coronary artery disease Father   . Ulcers Father     Social History Social History  Substance Use Topics  . Smoking status: Never Smoker  . Smokeless tobacco: Never Used  . Alcohol use No     Allergies   Atorvastatin; Amoxicillin; Ciprofloxacin; Clindamycin/lincomycin; Codeine; Penicillins; Ultracet [tramadol-acetaminophen]; and Tetanus  toxoids   Review of Systems Review of Systems  All other systems reviewed and are negative.    Physical Exam Updated Vital Signs BP (!) 118/46 (BP Location: Left Arm)   Pulse 86   Temp 98.6 F (37 C)   Resp 10   Ht 5' (1.524 m) Comment: Simultaneous filing. User may not have seen previous data.  Wt 58.9 kg (129 lb 12.8 oz) Comment: Simultaneous filing. User may not have seen previous data.  SpO2 100%   BMI 25.35 kg/m   Physical Exam  Constitutional: She is oriented to person, place, and time. She appears well-developed. She appears distressed (She  is uncomfortable).  Elderly, frail  HENT:  Head: Normocephalic.  Moderate left periorbital swelling, with ecchymosis.  Left periorbital superficial laceration, about 4 mm in length.  Moderate left zygoma region swelling with ecchymosis, extending to the mandible area.  No trismus.  No jaw instability.  Eyes: Pupils are equal, round, and reactive to light. Conjunctivae and EOM are normal.  Neck: Normal range of motion and phonation normal. Neck supple.  Cardiovascular: Normal rate and regular rhythm.   Pulmonary/Chest: Effort normal and breath sounds normal. No respiratory distress. She exhibits no tenderness.  Abdominal: Soft. She exhibits no distension. There is no tenderness. There is no guarding.  Musculoskeletal:  Moderate tenderness, swelling and ecchymosis, right proximal humerus region.  She resists motion at the site secondary to pain.  Mild pain with passive motion of the right hip.  No leg shortening or instability of the right leg.  Normal range of motion arms in the left leg.  Laceration left long finger, dorsally, bleeding somewhat.  Neurological: She is alert and oriented to person, place, and time. She exhibits normal muscle tone.  Skin: Skin is warm and dry.  Psychiatric: She has a normal mood and affect. Her behavior is normal.  Nursing note and vitals reviewed.    ED Treatments / Results  Labs (all labs ordered  are listed, but only abnormal results are displayed) Labs Reviewed  BASIC METABOLIC PANEL - Abnormal; Notable for the following:       Result Value   Sodium 134 (*)    CO2 20 (*)    Glucose, Bld 104 (*)    Calcium 8.2 (*)    All other components within normal limits  CBC - Abnormal; Notable for the following:    WBC 13.5 (*)    RBC 3.34 (*)    Hemoglobin 9.3 (*)    HCT 28.6 (*)    All other components within normal limits  URINALYSIS, ROUTINE W REFLEX MICROSCOPIC - Abnormal; Notable for the following:    Ketones, ur 5 (*)    Leukocytes, UA SMALL (*)    Bacteria, UA RARE (*)    Squamous Epithelial / LPF 0-5 (*)    All other components within normal limits  CBC - Abnormal; Notable for the following:    WBC 10.9 (*)    RBC 3.17 (*)    Hemoglobin 8.9 (*)    HCT 27.6 (*)    All other components within normal limits  BASIC METABOLIC PANEL - Abnormal; Notable for the following:    Sodium 134 (*)    CO2 20 (*)    Calcium 8.3 (*)    All other components within normal limits  CBG MONITORING, ED - Abnormal; Notable for the following:    Glucose-Capillary 137 (*)    All other components within normal limits  MRSA PCR SCREENING    EKG  EKG Interpretation  Date/Time:  Monday August 02 2017 11:24:33 EDT Ventricular Rate:  79 PR Interval:    QRS Duration: 100 QT Interval:  450 QTC Calculation: 516 R Axis:   49 Text Interpretation:  Sinus rhythm Low voltage, precordial leads RSR' in V1 or V2, right VCD or RVH Prolonged QT interval Since last tracing QT has lengthened Confirmed by Daleen Bo [phone]) on 08/02/2017 11:37:34 AM       Radiology Dg Shoulder Right Port  Result Date: 08/03/2017 CLINICAL DATA:  Right proximal humerus fracture.  Initial encounter. EXAM: PORTABLE RIGHT SHOULDER COMPARISON:  Yesterday FINDINGS: Single AP view of  the right shoulder shows unchanged comminuted fracturing of the upper humeral diaphysis with medial displacement. On prior radiograph, there is  also anterior displacement. Located glenohumeral joint. IMPRESSION: Unchanged alignment of comminuted proximal humeral diaphysis fracture in the AP projection. Electronically Signed   By: Monte Fantasia M.D.   On: 08/03/2017 13:57   Dg Hand Complete Left  Result Date: 08/02/2017 CLINICAL DATA:  Multiple falls today. EXAM: LEFT HAND - COMPLETE 3+ VIEW COMPARISON:  None. FINDINGS: Evaluation of the distal left second finger is limited by overlying metallic sensor. Surgical plate with multiple interlocking screws is partially visualized in the distal left radius, with no evidence of hardware fracture or loosening. No acute osseous fracture. No dislocation. Severe osteoarthritis at the first carpometacarpal joint. Diffuse osteopenia. No suspicious focal osseous lesions. IMPRESSION: 1. No evidence of acute osseous fracture or dislocation in the left hand, with limitations as described. 2. Severe osteoarthritis at the first carpometacarpal joint. 3. Diffuse osteopenia. Electronically Signed   By: Ilona Sorrel M.D.   On: 08/02/2017 17:44    Procedures Procedures (including critical care time)  Medications Ordered in ED Medications  0.9 %  sodium chloride infusion ( Intravenous New Bag/Given 08/04/17 1254)  bisacodyl (DULCOLAX) suppository 10 mg (not administered)  ondansetron (ZOFRAN-ODT) disintegrating tablet 4 mg (4 mg Oral Given 08/03/17 1259)    Or  ondansetron (ZOFRAN) injection 4 mg ( Intravenous See Alternative 08/03/17 1259)  metoprolol tartrate (LOPRESSOR) injection 5 mg (not administered)  sodium chloride (OCEAN) 0.65 % nasal spray 4 spray (4 sprays Each Nare Given 08/04/17 1255)  zolpidem (AMBIEN) tablet 5 mg (5 mg Oral Given 08/03/17 2207)  acetaminophen (TYLENOL) tablet 650 mg (650 mg Oral Given 08/03/17 1259)  oxyCODONE (Oxy IR/ROXICODONE) immediate release tablet 5-10 mg (10 mg Oral Given 08/04/17 0226)  polyethylene glycol (MIRALAX / GLYCOLAX) packet 17 g (not administered)  fentaNYL (SUBLIMAZE)  injection 50 mcg (50 mcg Intravenous Given 08/02/17 1232)  ondansetron (ZOFRAN) injection 4 mg (4 mg Intravenous Given 08/02/17 1232)  Tdap (BOOSTRIX) injection 0.5 mL (0.5 mLs Intramuscular Given 08/02/17 1232)  sodium chloride 0.9 % bolus 500 mL (0 mLs Intravenous Stopped 08/02/17 1440)  fentaNYL (SUBLIMAZE) injection 100 mcg (100 mcg Intravenous Given 08/02/17 1413)  lidocaine (PF) (XYLOCAINE) 1 % injection 5 mL (5 mLs Intradermal Given by Other 08/02/17 1800)  oxyCODONE (Oxy IR/ROXICODONE) 5 MG immediate release tablet (  Duplicate 06/01/98 3570)     Initial Impression / Assessment and Plan / ED Course  I have reviewed the triage vital signs and the nursing notes.  Pertinent labs & imaging results that were available during my care of the patient were reviewed by me and considered in my medical decision making (see chart for details).  Clinical Course as of Aug 04 1352  Mon Aug 02, 2017  1217 From communication with her PCP office, Dr. Felipa Eth, her last tetanus booster was 07/29/2007.  Tdap ordered  [EW]  1725 DG Hand Complete Left [EW]    Clinical Course User Index [EW] Daleen Bo, MD     Patient Vitals for the past 24 hrs:  BP Temp Temp src Pulse Resp SpO2  08/04/17 1140 (!) 118/46 98.6 F (37 C) - 86 10 -  08/04/17 1000 138/76 - - 88 15 100 %  08/04/17 0727 (!) 153/55 - - 100 18 98 %  08/04/17 0500 (!) 125/49 98.6 F (37 C) Axillary - 16 -  08/03/17 2324 (!) 154/43 - - - 15 -  08/03/17 2316 Marland Kitchen)  154/43 98.3 F (36.8 C) Oral - (!) 21 -  08/03/17 2200 134/64 - - - 12 -  08/03/17 2000 (!) 136/47 - - - 18 -  08/03/17 1951 (!) 142/52 99.5 F (37.5 C) Oral - 17 -  08/03/17 1800 (!) 131/54 - - - - -  08/03/17 1642 - 99.6 F (37.6 C) Axillary - - 99 %    Consultations: -Trauma surgery, will see patient in the emergency department. [facility] -Neurosurgery, will evaluate the patient. [place], will see patient.  Anticipate nonoperative management. [place] -Maxillofacial  trauma, will see patient. [place].   2:58 PM Reevaluation with update and discussion. After initial assessment and treatment, an updated evaluation reveals no change in clinical status.  Family updated on findings, and plan. Louana Fontenot L    CRITICAL CARE Performed by: Richarda Blade Total critical care time: 75 minutes Critical care time was exclusive of separately billable procedures and treating other patients. Critical care was necessary to treat or prevent imminent or life-threatening deterioration. Critical care was time spent personally by me on the following activities: development of treatment plan with patient and/or surrogate as well as nursing, discussions with consultants, evaluation of patient's response to treatment, examination of patient, obtaining history from patient or surrogate, ordering and performing treatments and interventions, ordering and review of laboratory studies, ordering and review of radiographic studies, pulse oximetry and re-evaluation of patient's condition.  Final Clinical Impressions(s) / ED Diagnoses   Final diagnoses:  Contusion of brain without loss of consciousness, initial encounter ([place])  Closed fracture of facial bone, unspecified facial bone, initial encounter ([place])  Closed fracture of shaft of right humerus, unspecified fracture morphology, initial encounter  Laceration of left middle finger without foreign body without damage to nail, initial encounter  Fall, initial encounter   Ground-level fall, with multiple facial injuries with small laceration left face.  Associated mild intracranial contusion, without focal neurologic abnormality.  Patient requires hospitalization, close monitoring and continued evaluation by, trauma surgery, neurosurgery, orthopedics, ear nose and throat surgery and may require medical evaluation as well.  Nursing Notes Reviewed/ Care Coordinated Applicable Imaging Reviewed Interpretation of Laboratory Data  incorporated into ED treatment   Plan: [place] Prescriptions Current Discharge Medication List       Daleen Bo, MD 08/04/17 1355

## 2017-08-02 NOTE — Progress Notes (Signed)
Orthopedic Tech Progress Note Patient Details:  [name] 06-08-1928 [id number]  Ortho Devices Type of Ortho Device: Arm sling, Ace wrap, Coapt Ortho Device/Splint Interventions: Application   [name] 08/02/2017, 3:40 PM

## 2017-08-02 NOTE — ED Notes (Signed)
Wentz, MD at bedside.  

## 2017-08-02 NOTE — Consult Note (Signed)
Reason for Consult: Left frontal contusion Referring Physician: Emergency department  [name] is an 83 y.o. female.  HPI: 83 year old female status post fall. Patient with some dizziness and unsteadiness. Denies headache. Patient with obvious facial injury. Also with arm fracture. Head CT scan demonstrates very small left frontal contusion.  Past Medical History:  Diagnosis Date  . Benign positional vertigo   . Bursitis started 10-2011   left leg and left hip  . Clostridium difficile diarrhea    hx of 10 years ago  . DJD (degenerative joint disease)   . Family history of adverse reaction to anesthesia    daughter has nausea  . Hypertension   . Leg cramps   . Osteoporosis   . Pneumonia    as a baby  . PONV (postoperative nausea and vomiting)   . Stroke ([place])   . Varicose veins     Past Surgical History:  Procedure Laterality Date  . ABDOMINAL HYSTERECTOMY    . APPENDECTOMY    . CATARACT EXTRACTION     right  . COLONOSCOPY    . HERNIA REPAIR    . KYPHOSIS SURGERY    . ORIF WRIST FRACTURE Left 12/24/2016   Procedure: OPEN REDUCTION INTERNAL FIXATION (ORIF) LEFT WRIST FRACTURE;  Surgeon: Roseanne Kaufman, MD;  Location: [facility];  Service: Orthopedics;  Laterality: Left;  . TUBAL LIGATION     bilateral    Family History  Problem Relation Age of Onset  . Alzheimer's disease Mother   . Stroke Mother   . Coronary artery disease Father   . Ulcers Father     Social History:  reports that she has never smoked. She has never used smokeless tobacco. She reports that she does not drink alcohol or use drugs.  Allergies:  Allergies  Allergen Reactions  . Atorvastatin Diarrhea  . Amoxicillin Nausea And Vomiting and Other (See Comments)    c-diff from antibiotics 209  . Ciprofloxacin Nausea And Vomiting and Other (See Comments)    c-diff from antibiotics 2009  . Clindamycin/Lincomycin Nausea And Vomiting and Other (See Comments)    c-diff from antiobiotics 2009  .  Codeine Nausea And Vomiting  . Penicillins Hives, Swelling, Rash and Other (See Comments)    Has patient had a PCN reaction causing immediate rash, facial/tongue/throat swelling, SOB or lightheadedness with hypotension: swelling of arm, and rash Has patient had a PCN reaction causing severe rash involving mucus membranes or skin necrosis: unknown Has patient had a PCN reaction that required hospitalization : unknown Has patient had a PCN reaction occurring within the last 10 years: no If all of the above answers are "NO", then may proceed with Cephalosporin use.   Marland Kitchen Ultracet [Tramadol-Acetaminophen] Nausea And Vomiting  . Tetanus Toxoids Hives    Reaction many years ago    Medications: I have reviewed the patient's current medications.  Results for orders placed or performed during the hospital encounter of 08/02/17 (from the past 48 hour(s))  CBG monitoring, ED     Status: Abnormal   Collection Time: 08/02/17 11:33 AM  Result Value Ref Range   Glucose-Capillary 137 (H) 65 - 99 mg/dL  Basic metabolic panel     Status: Abnormal   Collection Time: 08/02/17  4:44 PM  Result Value Ref Range   Sodium 134 (L) 135 - 145 mmol/L   Potassium 4.0 3.5 - 5.1 mmol/L   Chloride 105 101 - 111 mmol/L   CO2 20 (L) 22 - 32 mmol/L  Glucose, Bld 104 (H) 65 - 99 mg/dL   BUN 15 6 - 20 mg/dL   Creatinine, Ser 0.73 0.44 - 1.00 mg/dL   Calcium 8.2 (L) 8.9 - 10.3 mg/dL   GFR calc non Af Amer >60 >60 mL/min   GFR calc Af Amer >60 >60 mL/min    Comment: (NOTE) The eGFR has been calculated using the CKD EPI equation. This calculation has not been validated in all clinical situations. eGFR's persistently <60 mL/min signify possible Chronic Kidney Disease.    Anion gap 9 5 - 15  CBC     Status: Abnormal   Collection Time: 08/02/17  4:44 PM  Result Value Ref Range   WBC 13.5 (H) 4.0 - 10.5 K/uL   RBC 3.34 (L) 3.87 - 5.11 MIL/uL   Hemoglobin 9.3 (L) 12.0 - 15.0 g/dL   HCT 28.6 (L) 36.0 - 46.0 %   MCV  85.6 78.0 - 100.0 fL   MCH 27.8 26.0 - 34.0 pg   MCHC 32.5 30.0 - 36.0 g/dL   RDW 14.4 11.5 - 15.5 %   Platelets 231 150 - 400 K/uL  Urinalysis, Routine w reflex microscopic     Status: Abnormal   Collection Time: 08/02/17  5:02 PM  Result Value Ref Range   Color, Urine YELLOW YELLOW   APPearance CLEAR CLEAR   Specific Gravity, Urine 1.011 1.005 - 1.030   pH 7.0 5.0 - 8.0   Glucose, UA NEGATIVE NEGATIVE mg/dL   Hgb urine dipstick NEGATIVE NEGATIVE   Bilirubin Urine NEGATIVE NEGATIVE   Ketones, ur 5 (A) NEGATIVE mg/dL   Protein, ur NEGATIVE NEGATIVE mg/dL   Nitrite NEGATIVE NEGATIVE   Leukocytes, UA SMALL (A) NEGATIVE   RBC / HPF 0-5 0 - 5 RBC/hpf   WBC, UA 0-5 0 - 5 WBC/hpf   Bacteria, UA RARE (A) NONE SEEN   Squamous Epithelial / LPF 0-5 (A) NONE SEEN   Mucous PRESENT     Dg Pelvis 1-2 Views  Result Date: 08/02/2017 CLINICAL DATA:  83 year old female with pelvic pain EXAM: PELVIS - 1-2 VIEW COMPARISON:  None. FINDINGS: There is no evidence of pelvic fracture or diastasis. No pelvic bone lesions are seen. Moderately large rectal stool ball. IMPRESSION: 1. No evidence of acute fracture or malalignment. 2. Moderately large rectal stool ball. Does the patient have a history of constipation? Electronically Signed   By: Jacqulynn Cadet M.D.   On: 08/02/2017 13:05   Dg Shoulder Right  Result Date: 08/02/2017 CLINICAL DATA:  83 year old female with syncope EXAM: RIGHT SHOULDER - 2+ VIEW COMPARISON:  No prior FINDINGS: Acute comminuted fracture of the proximal humerus. Osteopenia. No displaced fracture of the visualized clavicle. IMPRESSION: Acute comminuted fracture of the proximal right humerus Electronically Signed   By: Corrie Mckusick D.O.   On: 08/02/2017 13:03   Ct Head Wo Contrast  Result Date: 08/02/2017 CLINICAL DATA:  83 year old female with syncopal episode yesterday while standing with history of trauma from a fall with injury to the face and head. Laceration and bruising around  a left thigh. Headache today. EXAM: CT HEAD WITHOUT CONTRAST CT MAXILLOFACIAL WITHOUT CONTRAST CT CERVICAL SPINE WITHOUT CONTRAST TECHNIQUE: Multidetector CT imaging of the head, cervical spine, and maxillofacial structures were performed using the standard protocol without intravenous contrast. Multiplanar CT image reconstructions of the cervical spine and maxillofacial structures were also generated. COMPARISON:  Brain MRI 05/31/2017.  Head CT 09/16/2016. FINDINGS: CT HEAD FINDINGS Brain: Small focus of high attenuation measuring 9  x 7 x 6 mm (axial image 22 of series 4 and coronal image 21 of series 6) in the left frontal cortex adjacent to the falx cerebri. Patchy and confluent areas of decreased attenuation are noted throughout the deep and periventricular white matter of the cerebral hemispheres bilaterally, compatible with chronic microvascular ischemic disease. No evidence of acute infarction, hydrocephalus, extra-axial collection or mass lesion/mass effect. Vascular: No hyperdense vessel or unexpected calcification. Skull: Facial bone fractures, as detailed below. Other: Subcutaneous emphysema in the left side of the face in the left periorbital region, as discussed below. CT MAXILLOFACIAL FINDINGS Osseous: Multiple acute fractures of the left side of the face. This includes a nondisplaced fracture through the anterior and lateral aspect of the left orbital floor, and a nondisplaced fracture through the lateral wall of the left orbit. Multiple fractures of the left maxillary sinus, including displaced comminuted fractures of the lateral/posterior wall which invaginate into the sinus, displaced and comminuted fractures of the anterior wall of the left maxillary sinus which also invaginated into the sinus, and subtle minimally displaced fractures of the medial wall of the sinus immediately inferior to the anterior aspect of the inferior turbinate. Notably, the fracture of the lateral wall the left maxillary  sinus involves the lateral aspect of the alveolar ridge. Acute minimally displaced fracture through the posterior aspect of the left zygomatic arch. Mandible is intact, and temporomandibular joints are located bilaterally. Orbits: Nondisplaced fractures of the lateral wall of the left orbit and anterior/lateral aspect of the orbital floor, as above. There is gas in the inferior aspect of the left orbit. This appears predominantly extraconal, however, coronal image 27 of series 10 demonstrates 1 tiny locule of gas which could conceivably be intraconal. No definite herniation of orbital contents or findings to suggest entrapment of extraocular muscles. Sinuses: High attenuation fluid lying dependently in the left maxillary sinus, compatible with hemosinus. Small amount of dependent fluid also noted in the left frontal sinus. Soft tissues: High attenuation soft tissue swelling in the left periorbital region, compatible with a hematoma. Subcutaneous emphysema and emphysema in the deeper soft tissue is adjacent to the left maxilla with some gas in the deep soft tissues of the face posterior to the left maxillary sinus. CT CERVICAL SPINE FINDINGS Alignment: Normal. Skull base and vertebrae: No acute fracture. No primary bone lesion or focal pathologic process. Soft tissues and spinal canal: No prevertebral fluid or swelling. No visible canal hematoma. Disc levels: Multilevel degenerative disc disease, most apparent at C6-C7. Moderate multilevel facet arthropathy. Upper chest: Areas of mild pleuroparenchymal scarring in the apices of the thorax bilaterally. Other: None. IMPRESSION: 1. Small cortical hemorrhage in the Ing left frontal lobe just lateral to the falx cerebri, as above. 2. Acute facial fractures involving the left maxilla, ethmoid and temporal bones, including nondisplaced fractures through the lateral wall and anterior/lateral aspect of the left orbital floor, minimally displaced fracture through the posterior  aspect of the left zygomatic arch as well as numerous displaced fractures of the left maxillary sinus, as detailed above. Hematoma, contusion and gas in the superficial soft tissues. 3. There is gas within the left orbit which is predominantly extraconal, however, there is one small locule of gas which appears to be intraconal. 4. No evidence of significant acute traumatic injury to the cervical spine. 5. Chronic microvascular ischemic changes in the cerebral white matter, as above. 6. Mild multilevel degenerative disc disease and cervical spondylosis, as above. Critical Value/emergent results were called by telephone at  the time of interpretation on 08/02/2017 at 12:50 pm to Dr. Daleen Bo, who verbally acknowledged these results. Electronically Signed   By: Vinnie Langton M.D.   On: 08/02/2017 12:56   Ct Cervical Spine Wo Contrast  Result Date: 08/02/2017 CLINICAL DATA:  83 year old female with syncopal episode yesterday while standing with history of trauma from a fall with injury to the face and head. Laceration and bruising around a left thigh. Headache today. EXAM: CT HEAD WITHOUT CONTRAST CT MAXILLOFACIAL WITHOUT CONTRAST CT CERVICAL SPINE WITHOUT CONTRAST TECHNIQUE: Multidetector CT imaging of the head, cervical spine, and maxillofacial structures were performed using the standard protocol without intravenous contrast. Multiplanar CT image reconstructions of the cervical spine and maxillofacial structures were also generated. COMPARISON:  Brain MRI 05/31/2017.  Head CT 09/16/2016. FINDINGS: CT HEAD FINDINGS Brain: Small focus of high attenuation measuring 9 x 7 x 6 mm (axial image 22 of series 4 and coronal image 21 of series 6) in the left frontal cortex adjacent to the falx cerebri. Patchy and confluent areas of decreased attenuation are noted throughout the deep and periventricular white matter of the cerebral hemispheres bilaterally, compatible with chronic microvascular ischemic disease. No  evidence of acute infarction, hydrocephalus, extra-axial collection or mass lesion/mass effect. Vascular: No hyperdense vessel or unexpected calcification. Skull: Facial bone fractures, as detailed below. Other: Subcutaneous emphysema in the left side of the face in the left periorbital region, as discussed below. CT MAXILLOFACIAL FINDINGS Osseous: Multiple acute fractures of the left side of the face. This includes a nondisplaced fracture through the anterior and lateral aspect of the left orbital floor, and a nondisplaced fracture through the lateral wall of the left orbit. Multiple fractures of the left maxillary sinus, including displaced comminuted fractures of the lateral/posterior wall which invaginate into the sinus, displaced and comminuted fractures of the anterior wall of the left maxillary sinus which also invaginated into the sinus, and subtle minimally displaced fractures of the medial wall of the sinus immediately inferior to the anterior aspect of the inferior turbinate. Notably, the fracture of the lateral wall the left maxillary sinus involves the lateral aspect of the alveolar ridge. Acute minimally displaced fracture through the posterior aspect of the left zygomatic arch. Mandible is intact, and temporomandibular joints are located bilaterally. Orbits: Nondisplaced fractures of the lateral wall of the left orbit and anterior/lateral aspect of the orbital floor, as above. There is gas in the inferior aspect of the left orbit. This appears predominantly extraconal, however, coronal image 27 of series 10 demonstrates 1 tiny locule of gas which could conceivably be intraconal. No definite herniation of orbital contents or findings to suggest entrapment of extraocular muscles. Sinuses: High attenuation fluid lying dependently in the left maxillary sinus, compatible with hemosinus. Small amount of dependent fluid also noted in the left frontal sinus. Soft tissues: High attenuation soft tissue swelling  in the left periorbital region, compatible with a hematoma. Subcutaneous emphysema and emphysema in the deeper soft tissue is adjacent to the left maxilla with some gas in the deep soft tissues of the face posterior to the left maxillary sinus. CT CERVICAL SPINE FINDINGS Alignment: Normal. Skull base and vertebrae: No acute fracture. No primary bone lesion or focal pathologic process. Soft tissues and spinal canal: No prevertebral fluid or swelling. No visible canal hematoma. Disc levels: Multilevel degenerative disc disease, most apparent at C6-C7. Moderate multilevel facet arthropathy. Upper chest: Areas of mild pleuroparenchymal scarring in the apices of the thorax bilaterally. Other: None. IMPRESSION: 1.  Small cortical hemorrhage in the Ing left frontal lobe just lateral to the falx cerebri, as above. 2. Acute facial fractures involving the left maxilla, ethmoid and temporal bones, including nondisplaced fractures through the lateral wall and anterior/lateral aspect of the left orbital floor, minimally displaced fracture through the posterior aspect of the left zygomatic arch as well as numerous displaced fractures of the left maxillary sinus, as detailed above. Hematoma, contusion and gas in the superficial soft tissues. 3. There is gas within the left orbit which is predominantly extraconal, however, there is one small locule of gas which appears to be intraconal. 4. No evidence of significant acute traumatic injury to the cervical spine. 5. Chronic microvascular ischemic changes in the cerebral white matter, as above. 6. Mild multilevel degenerative disc disease and cervical spondylosis, as above. Critical Value/emergent results were called by telephone at the time of interpretation on 08/02/2017 at 12:50 pm to Dr. Daleen Bo, who verbally acknowledged these results. Electronically Signed   By: Vinnie Langton M.D.   On: 08/02/2017 12:56   Dg Hand Complete Left  Result Date: 08/02/2017 CLINICAL DATA:   Multiple falls today. EXAM: LEFT HAND - COMPLETE 3+ VIEW COMPARISON:  None. FINDINGS: Evaluation of the distal left second finger is limited by overlying metallic sensor. Surgical plate with multiple interlocking screws is partially visualized in the distal left radius, with no evidence of hardware fracture or loosening. No acute osseous fracture. No dislocation. Severe osteoarthritis at the first carpometacarpal joint. Diffuse osteopenia. No suspicious focal osseous lesions. IMPRESSION: 1. No evidence of acute osseous fracture or dislocation in the left hand, with limitations as described. 2. Severe osteoarthritis at the first carpometacarpal joint. 3. Diffuse osteopenia. Electronically Signed   By: Ilona Sorrel M.D.   On: 08/02/2017 17:44   Ct Maxillofacial Wo Cm  Result Date: 08/02/2017 CLINICAL DATA:  83 year old female with syncopal episode yesterday while standing with history of trauma from a fall with injury to the face and head. Laceration and bruising around a left thigh. Headache today. EXAM: CT HEAD WITHOUT CONTRAST CT MAXILLOFACIAL WITHOUT CONTRAST CT CERVICAL SPINE WITHOUT CONTRAST TECHNIQUE: Multidetector CT imaging of the head, cervical spine, and maxillofacial structures were performed using the standard protocol without intravenous contrast. Multiplanar CT image reconstructions of the cervical spine and maxillofacial structures were also generated. COMPARISON:  Brain MRI 05/31/2017.  Head CT 09/16/2016. FINDINGS: CT HEAD FINDINGS Brain: Small focus of high attenuation measuring 9 x 7 x 6 mm (axial image 22 of series 4 and coronal image 21 of series 6) in the left frontal cortex adjacent to the falx cerebri. Patchy and confluent areas of decreased attenuation are noted throughout the deep and periventricular white matter of the cerebral hemispheres bilaterally, compatible with chronic microvascular ischemic disease. No evidence of acute infarction, hydrocephalus, extra-axial collection or mass  lesion/mass effect. Vascular: No hyperdense vessel or unexpected calcification. Skull: Facial bone fractures, as detailed below. Other: Subcutaneous emphysema in the left side of the face in the left periorbital region, as discussed below. CT MAXILLOFACIAL FINDINGS Osseous: Multiple acute fractures of the left side of the face. This includes a nondisplaced fracture through the anterior and lateral aspect of the left orbital floor, and a nondisplaced fracture through the lateral wall of the left orbit. Multiple fractures of the left maxillary sinus, including displaced comminuted fractures of the lateral/posterior wall which invaginate into the sinus, displaced and comminuted fractures of the anterior wall of the left maxillary sinus which also invaginated into the sinus,  and subtle minimally displaced fractures of the medial wall of the sinus immediately inferior to the anterior aspect of the inferior turbinate. Notably, the fracture of the lateral wall the left maxillary sinus involves the lateral aspect of the alveolar ridge. Acute minimally displaced fracture through the posterior aspect of the left zygomatic arch. Mandible is intact, and temporomandibular joints are located bilaterally. Orbits: Nondisplaced fractures of the lateral wall of the left orbit and anterior/lateral aspect of the orbital floor, as above. There is gas in the inferior aspect of the left orbit. This appears predominantly extraconal, however, coronal image 27 of series 10 demonstrates 1 tiny locule of gas which could conceivably be intraconal. No definite herniation of orbital contents or findings to suggest entrapment of extraocular muscles. Sinuses: High attenuation fluid lying dependently in the left maxillary sinus, compatible with hemosinus. Small amount of dependent fluid also noted in the left frontal sinus. Soft tissues: High attenuation soft tissue swelling in the left periorbital region, compatible with a hematoma. Subcutaneous  emphysema and emphysema in the deeper soft tissue is adjacent to the left maxilla with some gas in the deep soft tissues of the face posterior to the left maxillary sinus. CT CERVICAL SPINE FINDINGS Alignment: Normal. Skull base and vertebrae: No acute fracture. No primary bone lesion or focal pathologic process. Soft tissues and spinal canal: No prevertebral fluid or swelling. No visible canal hematoma. Disc levels: Multilevel degenerative disc disease, most apparent at C6-C7. Moderate multilevel facet arthropathy. Upper chest: Areas of mild pleuroparenchymal scarring in the apices of the thorax bilaterally. Other: None. IMPRESSION: 1. Small cortical hemorrhage in the Ing left frontal lobe just lateral to the falx cerebri, as above. 2. Acute facial fractures involving the left maxilla, ethmoid and temporal bones, including nondisplaced fractures through the lateral wall and anterior/lateral aspect of the left orbital floor, minimally displaced fracture through the posterior aspect of the left zygomatic arch as well as numerous displaced fractures of the left maxillary sinus, as detailed above. Hematoma, contusion and gas in the superficial soft tissues. 3. There is gas within the left orbit which is predominantly extraconal, however, there is one small locule of gas which appears to be intraconal. 4. No evidence of significant acute traumatic injury to the cervical spine. 5. Chronic microvascular ischemic changes in the cerebral white matter, as above. 6. Mild multilevel degenerative disc disease and cervical spondylosis, as above. Critical Value/emergent results were called by telephone at the time of interpretation on 08/02/2017 at 12:50 pm to Dr. Daleen Bo, who verbally acknowledged these results. Electronically Signed   By: Vinnie Langton M.D.   On: 08/02/2017 12:56    Pertinent items noted in HPI and remainder of comprehensive ROS otherwise negative. Blood pressure 139/63, pulse 86, temperature 97.9 F  (36.6 C), temperature source Oral, resp. rate 14, height 5' (1.524 m), weight 55.8 kg (123 lb), SpO2 100 %. Patient is awake and aware. She is oriented and appropriate. Her speech is fluent. Judgment and insight are intact. Cranial nerve function appears intact. Motor 5/5 bilaterally. No drift. Sensation normal. Exam Asian head ears eyes and throat demonstrates evidence of a left orbital/zygomatic fracture. Extremities with right arm swelling.  Assessment/Plan: Minimal left frontal contusion. No need for follow-up imaging unless situation changes neurologically. Call me with changes.  Oluwademilade Mckiver A 08/02/2017, 6:01 PM

## 2017-08-02 NOTE — ED Notes (Signed)
Eulis Foster, MD aware of pt c/o pain at IV insertion site, no evidence of infiltration, small amt of redness noted, MD reports that IV site can be used, warm compress to be placed at site for comfort

## 2017-08-02 NOTE — ED Triage Notes (Signed)
Per EMS, patient lives alone at home and had a syncopal episode.  Patient hit face causing bruised left eye and lacerations.  Patient also fell yesterday causing swelling to RUA and bruising to right thigh.  Denies pain.  Pelvis good.  BP 142/67, HR 85, RR 16. 12 lead unremarkable.  500 ml given en route.  18 LFA.

## 2017-08-02 NOTE — ED Provider Notes (Signed)
  Physical Exam  BP (!) 124/58   Pulse 85   Temp 97.9 F (36.6 C) (Oral)   Resp 14   Ht 5' (1.524 m)   Wt 55.8 kg (123 lb)   SpO2 100%   BMI 24.02 kg/m   Physical Exam  ED Course  .Marland KitchenLaceration Repair Date/Time: 08/02/2017 7:23 PM Performed by: Monico Blitz Authorized by: Monico Blitz   Consent:    Consent obtained:  Verbal   Consent given by:  Patient   Alternatives discussed:  No treatment Anesthesia (see MAR for exact dosages):    Anesthesia method:  None Laceration details:    Location:  Finger   Length (cm):  2 Exploration:    Hemostasis achieved with:  Cautery   Wound exploration: wound explored through full range of motion     Contaminated: no   Treatment:    Area cleansed with:  Saline   Amount of cleaning:  Standard   Irrigation solution:  Sterile saline Skin repair:    Repair method:  Sutures   Suture material:  Fast-absorbing gut   Suture technique:  Simple interrupted   Number of sutures:  4 Approximation:    Approximation:  Close   Vermilion border: well-aligned   Post-procedure details:    Dressing:  Antibiotic ointment   Patient tolerance of procedure:  Tolerated well, no immediate complications     MDM        Waynetta Pean 08/03/17 1045    Daleen Bo, MD 08/04/17 1353

## 2017-08-02 NOTE — Consult Note (Signed)
ENT/FACIAL TRAUMA CONSULT:  Reason for Consult: Facial Trauma Referring Physician: Trauma Svc  [name] is an 83 y.o. female.  HPI: Patient presents to [facility] ER after suffering acute fall. He reports several falls to ground over the last 2 days, patient presents to the emergency department with left periorbital swelling ecchymosis and right arm deformity. No prior history of facial trauma or injury. Patient reports mild swelling and discomfort, no visual change, she is able to wear her upper denture without difficulty.  Past Medical History:  Diagnosis Date  . Benign positional vertigo   . Bursitis started 10-2011   left leg and left hip  . Clostridium difficile diarrhea    hx of 10 years ago  . DJD (degenerative joint disease)   . Family history of adverse reaction to anesthesia    daughter has nausea  . Hypertension   . Leg cramps   . Osteoporosis   . Pneumonia    as a baby  . PONV (postoperative nausea and vomiting)   . Stroke ([place])   . Varicose veins     Past Surgical History:  Procedure Laterality Date  . ABDOMINAL HYSTERECTOMY    . APPENDECTOMY    . CATARACT EXTRACTION     right  . COLONOSCOPY    . HERNIA REPAIR    . KYPHOSIS SURGERY    . ORIF WRIST FRACTURE Left 12/24/2016   Procedure: OPEN REDUCTION INTERNAL FIXATION (ORIF) LEFT WRIST FRACTURE;  Surgeon: Roseanne Kaufman, MD;  Location: [facility];  Service: Orthopedics;  Laterality: Left;  . TUBAL LIGATION     bilateral    Family History  Problem Relation Age of Onset  . Alzheimer's disease Mother   . Stroke Mother   . Coronary artery disease Father   . Ulcers Father     Social History:  reports that she has never smoked. She has never used smokeless tobacco. She reports that she does not drink alcohol or use drugs.  Allergies:  Allergies  Allergen Reactions  . Ciprofloxacin Other (See Comments)    unknown  . Tetanus Toxoids Other (See Comments)    unknown  . Amoxicillin  Diarrhea  . Clindamycin/Lincomycin Diarrhea  . Codeine Nausea And Vomiting  . Penicillins Hives, Swelling, Rash and Other (See Comments)    Has patient had a PCN reaction causing immediate rash, facial/tongue/throat swelling, SOB or lightheadedness with hypotension: swelling of arm, and rash Has patient had a PCN reaction causing severe rash involving mucus membranes or skin necrosis: unknown Has patient had a PCN reaction that required hospitalization : unknown Has patient had a PCN reaction occurring within the last 10 years: no If all of the above answers are "NO", then may proceed with Cephalosporin use.   Marland Kitchen Ultracet [Tramadol-Acetaminophen] Nausea And Vomiting    Medications: I have reviewed the patient's current medications.  Results for orders placed or performed during the hospital encounter of 08/02/17 (from the past 48 hour(s))  CBG monitoring, ED     Status: Abnormal   Collection Time: 08/02/17 11:33 AM  Result Value Ref Range   Glucose-Capillary 137 (H) 65 - 99 mg/dL    Dg Pelvis 1-2 Views  Result Date: 08/02/2017 CLINICAL DATA:  83 year old female with pelvic pain EXAM: PELVIS - 1-2 VIEW COMPARISON:  None. FINDINGS: There is no evidence of pelvic fracture or diastasis. No pelvic bone lesions are seen. Moderately large rectal stool ball. IMPRESSION: 1. No evidence of acute fracture or malalignment. 2. Moderately large rectal  stool ball. Does the patient have a history of constipation? Electronically Signed   By: Jacqulynn Cadet M.D.   On: 08/02/2017 13:05   Dg Shoulder Right  Result Date: 08/02/2017 CLINICAL DATA:  83 year old female with syncope EXAM: RIGHT SHOULDER - 2+ VIEW COMPARISON:  No prior FINDINGS: Acute comminuted fracture of the proximal humerus. Osteopenia. No displaced fracture of the visualized clavicle. IMPRESSION: Acute comminuted fracture of the proximal right humerus Electronically Signed   By: Corrie Mckusick D.O.   On: 08/02/2017 13:03   Ct Head Wo  Contrast  Result Date: 08/02/2017 CLINICAL DATA:  83 year old female with syncopal episode yesterday while standing with history of trauma from a fall with injury to the face and head. Laceration and bruising around a left thigh. Headache today. EXAM: CT HEAD WITHOUT CONTRAST CT MAXILLOFACIAL WITHOUT CONTRAST CT CERVICAL SPINE WITHOUT CONTRAST TECHNIQUE: Multidetector CT imaging of the head, cervical spine, and maxillofacial structures were performed using the standard protocol without intravenous contrast. Multiplanar CT image reconstructions of the cervical spine and maxillofacial structures were also generated. COMPARISON:  Brain MRI 05/31/2017.  Head CT 09/16/2016. FINDINGS: CT HEAD FINDINGS Brain: Small focus of high attenuation measuring 9 x 7 x 6 mm (axial image 22 of series 4 and coronal image 21 of series 6) in the left frontal cortex adjacent to the falx cerebri. Patchy and confluent areas of decreased attenuation are noted throughout the deep and periventricular white matter of the cerebral hemispheres bilaterally, compatible with chronic microvascular ischemic disease. No evidence of acute infarction, hydrocephalus, extra-axial collection or mass lesion/mass effect. Vascular: No hyperdense vessel or unexpected calcification. Skull: Facial bone fractures, as detailed below. Other: Subcutaneous emphysema in the left side of the face in the left periorbital region, as discussed below. CT MAXILLOFACIAL FINDINGS Osseous: Multiple acute fractures of the left side of the face. This includes a nondisplaced fracture through the anterior and lateral aspect of the left orbital floor, and a nondisplaced fracture through the lateral wall of the left orbit. Multiple fractures of the left maxillary sinus, including displaced comminuted fractures of the lateral/posterior wall which invaginate into the sinus, displaced and comminuted fractures of the anterior wall of the left maxillary sinus which also invaginated into  the sinus, and subtle minimally displaced fractures of the medial wall of the sinus immediately inferior to the anterior aspect of the inferior turbinate. Notably, the fracture of the lateral wall the left maxillary sinus involves the lateral aspect of the alveolar ridge. Acute minimally displaced fracture through the posterior aspect of the left zygomatic arch. Mandible is intact, and temporomandibular joints are located bilaterally. Orbits: Nondisplaced fractures of the lateral wall of the left orbit and anterior/lateral aspect of the orbital floor, as above. There is gas in the inferior aspect of the left orbit. This appears predominantly extraconal, however, coronal image 27 of series 10 demonstrates 1 tiny locule of gas which could conceivably be intraconal. No definite herniation of orbital contents or findings to suggest entrapment of extraocular muscles. Sinuses: High attenuation fluid lying dependently in the left maxillary sinus, compatible with hemosinus. Small amount of dependent fluid also noted in the left frontal sinus. Soft tissues: High attenuation soft tissue swelling in the left periorbital region, compatible with a hematoma. Subcutaneous emphysema and emphysema in the deeper soft tissue is adjacent to the left maxilla with some gas in the deep soft tissues of the face posterior to the left maxillary sinus. CT CERVICAL SPINE FINDINGS Alignment: Normal. Skull base and vertebrae: No  acute fracture. No primary bone lesion or focal pathologic process. Soft tissues and spinal canal: No prevertebral fluid or swelling. No visible canal hematoma. Disc levels: Multilevel degenerative disc disease, most apparent at C6-C7. Moderate multilevel facet arthropathy. Upper chest: Areas of mild pleuroparenchymal scarring in the apices of the thorax bilaterally. Other: None. IMPRESSION: 1. Small cortical hemorrhage in the Ing left frontal lobe just lateral to the falx cerebri, as above. 2. Acute facial fractures  involving the left maxilla, ethmoid and temporal bones, including nondisplaced fractures through the lateral wall and anterior/lateral aspect of the left orbital floor, minimally displaced fracture through the posterior aspect of the left zygomatic arch as well as numerous displaced fractures of the left maxillary sinus, as detailed above. Hematoma, contusion and gas in the superficial soft tissues. 3. There is gas within the left orbit which is predominantly extraconal, however, there is one small locule of gas which appears to be intraconal. 4. No evidence of significant acute traumatic injury to the cervical spine. 5. Chronic microvascular ischemic changes in the cerebral white matter, as above. 6. Mild multilevel degenerative disc disease and cervical spondylosis, as above. Critical Value/emergent results were called by telephone at the time of interpretation on 08/02/2017 at 12:50 pm to Dr. Daleen Bo, who verbally acknowledged these results. Electronically Signed   By: Vinnie Langton M.D.   On: 08/02/2017 12:56   Ct Cervical Spine Wo Contrast  Result Date: 08/02/2017 CLINICAL DATA:  83 year old female with syncopal episode yesterday while standing with history of trauma from a fall with injury to the face and head. Laceration and bruising around a left thigh. Headache today. EXAM: CT HEAD WITHOUT CONTRAST CT MAXILLOFACIAL WITHOUT CONTRAST CT CERVICAL SPINE WITHOUT CONTRAST TECHNIQUE: Multidetector CT imaging of the head, cervical spine, and maxillofacial structures were performed using the standard protocol without intravenous contrast. Multiplanar CT image reconstructions of the cervical spine and maxillofacial structures were also generated. COMPARISON:  Brain MRI 05/31/2017.  Head CT 09/16/2016. FINDINGS: CT HEAD FINDINGS Brain: Small focus of high attenuation measuring 9 x 7 x 6 mm (axial image 22 of series 4 and coronal image 21 of series 6) in the left frontal cortex adjacent to the falx cerebri.  Patchy and confluent areas of decreased attenuation are noted throughout the deep and periventricular white matter of the cerebral hemispheres bilaterally, compatible with chronic microvascular ischemic disease. No evidence of acute infarction, hydrocephalus, extra-axial collection or mass lesion/mass effect. Vascular: No hyperdense vessel or unexpected calcification. Skull: Facial bone fractures, as detailed below. Other: Subcutaneous emphysema in the left side of the face in the left periorbital region, as discussed below. CT MAXILLOFACIAL FINDINGS Osseous: Multiple acute fractures of the left side of the face. This includes a nondisplaced fracture through the anterior and lateral aspect of the left orbital floor, and a nondisplaced fracture through the lateral wall of the left orbit. Multiple fractures of the left maxillary sinus, including displaced comminuted fractures of the lateral/posterior wall which invaginate into the sinus, displaced and comminuted fractures of the anterior wall of the left maxillary sinus which also invaginated into the sinus, and subtle minimally displaced fractures of the medial wall of the sinus immediately inferior to the anterior aspect of the inferior turbinate. Notably, the fracture of the lateral wall the left maxillary sinus involves the lateral aspect of the alveolar ridge. Acute minimally displaced fracture through the posterior aspect of the left zygomatic arch. Mandible is intact, and temporomandibular joints are located bilaterally. Orbits: Nondisplaced fractures of the  lateral wall of the left orbit and anterior/lateral aspect of the orbital floor, as above. There is gas in the inferior aspect of the left orbit. This appears predominantly extraconal, however, coronal image 27 of series 10 demonstrates 1 tiny locule of gas which could conceivably be intraconal. No definite herniation of orbital contents or findings to suggest entrapment of extraocular muscles. Sinuses:  High attenuation fluid lying dependently in the left maxillary sinus, compatible with hemosinus. Small amount of dependent fluid also noted in the left frontal sinus. Soft tissues: High attenuation soft tissue swelling in the left periorbital region, compatible with a hematoma. Subcutaneous emphysema and emphysema in the deeper soft tissue is adjacent to the left maxilla with some gas in the deep soft tissues of the face posterior to the left maxillary sinus. CT CERVICAL SPINE FINDINGS Alignment: Normal. Skull base and vertebrae: No acute fracture. No primary bone lesion or focal pathologic process. Soft tissues and spinal canal: No prevertebral fluid or swelling. No visible canal hematoma. Disc levels: Multilevel degenerative disc disease, most apparent at C6-C7. Moderate multilevel facet arthropathy. Upper chest: Areas of mild pleuroparenchymal scarring in the apices of the thorax bilaterally. Other: None. IMPRESSION: 1. Small cortical hemorrhage in the Ing left frontal lobe just lateral to the falx cerebri, as above. 2. Acute facial fractures involving the left maxilla, ethmoid and temporal bones, including nondisplaced fractures through the lateral wall and anterior/lateral aspect of the left orbital floor, minimally displaced fracture through the posterior aspect of the left zygomatic arch as well as numerous displaced fractures of the left maxillary sinus, as detailed above. Hematoma, contusion and gas in the superficial soft tissues. 3. There is gas within the left orbit which is predominantly extraconal, however, there is one small locule of gas which appears to be intraconal. 4. No evidence of significant acute traumatic injury to the cervical spine. 5. Chronic microvascular ischemic changes in the cerebral white matter, as above. 6. Mild multilevel degenerative disc disease and cervical spondylosis, as above. Critical Value/emergent results were called by telephone at the time of interpretation on 08/02/2017  at 12:50 pm to Dr. Daleen Bo, who verbally acknowledged these results. Electronically Signed   By: Vinnie Langton M.D.   On: 08/02/2017 12:56   Ct Maxillofacial Wo Cm  Result Date: 08/02/2017 CLINICAL DATA:  83 year old female with syncopal episode yesterday while standing with history of trauma from a fall with injury to the face and head. Laceration and bruising around a left thigh. Headache today. EXAM: CT HEAD WITHOUT CONTRAST CT MAXILLOFACIAL WITHOUT CONTRAST CT CERVICAL SPINE WITHOUT CONTRAST TECHNIQUE: Multidetector CT imaging of the head, cervical spine, and maxillofacial structures were performed using the standard protocol without intravenous contrast. Multiplanar CT image reconstructions of the cervical spine and maxillofacial structures were also generated. COMPARISON:  Brain MRI 05/31/2017.  Head CT 09/16/2016. FINDINGS: CT HEAD FINDINGS Brain: Small focus of high attenuation measuring 9 x 7 x 6 mm (axial image 22 of series 4 and coronal image 21 of series 6) in the left frontal cortex adjacent to the falx cerebri. Patchy and confluent areas of decreased attenuation are noted throughout the deep and periventricular white matter of the cerebral hemispheres bilaterally, compatible with chronic microvascular ischemic disease. No evidence of acute infarction, hydrocephalus, extra-axial collection or mass lesion/mass effect. Vascular: No hyperdense vessel or unexpected calcification. Skull: Facial bone fractures, as detailed below. Other: Subcutaneous emphysema in the left side of the face in the left periorbital region, as discussed below. CT MAXILLOFACIAL FINDINGS  Osseous: Multiple acute fractures of the left side of the face. This includes a nondisplaced fracture through the anterior and lateral aspect of the left orbital floor, and a nondisplaced fracture through the lateral wall of the left orbit. Multiple fractures of the left maxillary sinus, including displaced comminuted fractures of the  lateral/posterior wall which invaginate into the sinus, displaced and comminuted fractures of the anterior wall of the left maxillary sinus which also invaginated into the sinus, and subtle minimally displaced fractures of the medial wall of the sinus immediately inferior to the anterior aspect of the inferior turbinate. Notably, the fracture of the lateral wall the left maxillary sinus involves the lateral aspect of the alveolar ridge. Acute minimally displaced fracture through the posterior aspect of the left zygomatic arch. Mandible is intact, and temporomandibular joints are located bilaterally. Orbits: Nondisplaced fractures of the lateral wall of the left orbit and anterior/lateral aspect of the orbital floor, as above. There is gas in the inferior aspect of the left orbit. This appears predominantly extraconal, however, coronal image 27 of series 10 demonstrates 1 tiny locule of gas which could conceivably be intraconal. No definite herniation of orbital contents or findings to suggest entrapment of extraocular muscles. Sinuses: High attenuation fluid lying dependently in the left maxillary sinus, compatible with hemosinus. Small amount of dependent fluid also noted in the left frontal sinus. Soft tissues: High attenuation soft tissue swelling in the left periorbital region, compatible with a hematoma. Subcutaneous emphysema and emphysema in the deeper soft tissue is adjacent to the left maxilla with some gas in the deep soft tissues of the face posterior to the left maxillary sinus. CT CERVICAL SPINE FINDINGS Alignment: Normal. Skull base and vertebrae: No acute fracture. No primary bone lesion or focal pathologic process. Soft tissues and spinal canal: No prevertebral fluid or swelling. No visible canal hematoma. Disc levels: Multilevel degenerative disc disease, most apparent at C6-C7. Moderate multilevel facet arthropathy. Upper chest: Areas of mild pleuroparenchymal scarring in the apices of the thorax  bilaterally. Other: None. IMPRESSION: 1. Small cortical hemorrhage in the Ing left frontal lobe just lateral to the falx cerebri, as above. 2. Acute facial fractures involving the left maxilla, ethmoid and temporal bones, including nondisplaced fractures through the lateral wall and anterior/lateral aspect of the left orbital floor, minimally displaced fracture through the posterior aspect of the left zygomatic arch as well as numerous displaced fractures of the left maxillary sinus, as detailed above. Hematoma, contusion and gas in the superficial soft tissues. 3. There is gas within the left orbit which is predominantly extraconal, however, there is one small locule of gas which appears to be intraconal. 4. No evidence of significant acute traumatic injury to the cervical spine. 5. Chronic microvascular ischemic changes in the cerebral white matter, as above. 6. Mild multilevel degenerative disc disease and cervical spondylosis, as above. Critical Value/emergent results were called by telephone at the time of interpretation on 08/02/2017 at 12:50 pm to Dr. Daleen Bo, who verbally acknowledged these results. Electronically Signed   By: Vinnie Langton M.D.   On: 08/02/2017 12:56    ROS:ROS 12 systems reviewed and negative except as stated in HPI   Blood pressure (!) 141/53, pulse 85, temperature 97.9 F (36.6 C), temperature source Oral, resp. rate 14, height 5' (1.524 m), weight 55.8 kg (123 lb), SpO2 100 %.  PHYSICAL EXAM: General appearance - alert, well appearing, and in no distress and oriented to person, place, and time Eyes - pupils equal and  reactive, extraocular eye movements intact, no evidence of diplopia or entrapment. Moderate right periorbital ecchymosis. Nose - normal and patent, no erythema, discharge or polyps and minimal bloody crusting, no active bleeding Mouth - mucous membranes moist, pharynx normal without lesions and Bruising of the buccal mucosa, upper denture in place without  laceration or displacement. No trismus. Neck - supple, no significant adenopathy, no swelling, nontender no bruising.  Studies Reviewed: Maxillofacial CT scan  Assessment/Plan: Patient presents to emergency department with acute injuries including nondisplaced left periorbital fractures, left humeral fracture and small amount of intracranial bleeding. CT scan reviewed, patient has minimally displaced left orbit, maxillary and zygomatic fractures. There is no diplopia or trismus. No acute intervention required. Discussed fracture precautions with the patient's family as outlined below. Avoid any additional trauma, soft diet, frequent nasal saline spray. Plan follow-up as an outpatient in several weeks for recheck or sooner as needed. Given the patient's history and findings I doubt this will require surgical intervention.  Fracture precautions: 1. Elevate head of bed 2. Ice compress to periorbital region 3. Avoid additional trauma, nose blowing or sneezing 4. Liquid and soft diet as tolerated 5. Saline nasal spray 4 times a day and when necessary    [place], Vashon Riordan 08/02/2017, 4:43 PM

## 2017-08-02 NOTE — ED Notes (Signed)
Unable to collect labs, this RN called phlebotomy to collect

## 2017-08-02 NOTE — ED Notes (Signed)
[place], MD at bedside for R arm reduction, ortho tech at bedside for R arm splint, pt tolerated well, family at beside

## 2017-08-02 NOTE — ED Notes (Signed)
Wound care has been done ,pt.has been cleaned up clean gown on

## 2017-08-02 NOTE — Consult Note (Signed)
Reason for Consult: right humerus fracture Referring Physician: Eulis Foster MD  [name] is an 83 y.o. female.  HPI: 83 yo female with a history of frequent falls who reports severe right arm pain and associated deformity after several falls over the last 2 days.  Patient also complains of right hip pain and HA. Questionable LOC associated with falls today. Patient being admitted to trauma service for observation and further workup.  Past Medical History:  Diagnosis Date  . Benign positional vertigo   . Bursitis started 10-2011   left leg and left hip  . Clostridium difficile diarrhea    hx of 10 years ago  . DJD (degenerative joint disease)   . Family history of adverse reaction to anesthesia    daughter has nausea  . Hypertension   . Leg cramps   . Osteoporosis   . Pneumonia    as a baby  . PONV (postoperative nausea and vomiting)   . Stroke ([place])   . Varicose veins     Past Surgical History:  Procedure Laterality Date  . ABDOMINAL HYSTERECTOMY    . APPENDECTOMY    . CATARACT EXTRACTION     right  . COLONOSCOPY    . HERNIA REPAIR    . KYPHOSIS SURGERY    . ORIF WRIST FRACTURE Left 12/24/2016   Procedure: OPEN REDUCTION INTERNAL FIXATION (ORIF) LEFT WRIST FRACTURE;  Surgeon: Roseanne Kaufman, MD;  Location: [facility];  Service: Orthopedics;  Laterality: Left;  . TUBAL LIGATION     bilateral    Family History  Problem Relation Age of Onset  . Alzheimer's disease Mother   . Stroke Mother   . Coronary artery disease Father   . Ulcers Father     Social History:  reports that she has never smoked. She has never used smokeless tobacco. She reports that she does not drink alcohol or use drugs.  Allergies:  Allergies  Allergen Reactions  . Ciprofloxacin Other (See Comments)    unknown  . Tetanus Toxoids Other (See Comments)    unknown  . Amoxicillin Diarrhea  . Clindamycin/Lincomycin Diarrhea  . Codeine Nausea And Vomiting  . Penicillins Hives, Swelling, Rash  and Other (See Comments)    Has patient had a PCN reaction causing immediate rash, facial/tongue/throat swelling, SOB or lightheadedness with hypotension: swelling of arm, and rash Has patient had a PCN reaction causing severe rash involving mucus membranes or skin necrosis: unknown Has patient had a PCN reaction that required hospitalization : unknown Has patient had a PCN reaction occurring within the last 10 years: no If all of the above answers are "NO", then may proceed with Cephalosporin use.   Marland Kitchen Ultracet [Tramadol-Acetaminophen] Nausea And Vomiting    Medications: I have reviewed the patient's current medications.  Results for orders placed or performed during the hospital encounter of 08/02/17 (from the past 48 hour(s))  CBG monitoring, ED     Status: Abnormal   Collection Time: 08/02/17 11:33 AM  Result Value Ref Range   Glucose-Capillary 137 (H) 65 - 99 mg/dL    Ct Head Wo Contrast  Result Date: 08/02/2017 CLINICAL DATA:  83 year old female with syncopal episode yesterday while standing with history of trauma from a fall with injury to the face and head. Laceration and bruising around a left thigh. Headache today. EXAM: CT HEAD WITHOUT CONTRAST CT MAXILLOFACIAL WITHOUT CONTRAST CT CERVICAL SPINE WITHOUT CONTRAST TECHNIQUE: Multidetector CT imaging of the head, cervical spine, and maxillofacial structures were performed using the  standard protocol without intravenous contrast. Multiplanar CT image reconstructions of the cervical spine and maxillofacial structures were also generated. COMPARISON:  Brain MRI 05/31/2017.  Head CT 09/16/2016. FINDINGS: CT HEAD FINDINGS Brain: Small focus of high attenuation measuring 9 x 7 x 6 mm (axial image 22 of series 4 and coronal image 21 of series 6) in the left frontal cortex adjacent to the falx cerebri. Patchy and confluent areas of decreased attenuation are noted throughout the deep and periventricular white matter of the cerebral hemispheres  bilaterally, compatible with chronic microvascular ischemic disease. No evidence of acute infarction, hydrocephalus, extra-axial collection or mass lesion/mass effect. Vascular: No hyperdense vessel or unexpected calcification. Skull: Facial bone fractures, as detailed below. Other: Subcutaneous emphysema in the left side of the face in the left periorbital region, as discussed below. CT MAXILLOFACIAL FINDINGS Osseous: Multiple acute fractures of the left side of the face. This includes a nondisplaced fracture through the anterior and lateral aspect of the left orbital floor, and a nondisplaced fracture through the lateral wall of the left orbit. Multiple fractures of the left maxillary sinus, including displaced comminuted fractures of the lateral/posterior wall which invaginate into the sinus, displaced and comminuted fractures of the anterior wall of the left maxillary sinus which also invaginated into the sinus, and subtle minimally displaced fractures of the medial wall of the sinus immediately inferior to the anterior aspect of the inferior turbinate. Notably, the fracture of the lateral wall the left maxillary sinus involves the lateral aspect of the alveolar ridge. Acute minimally displaced fracture through the posterior aspect of the left zygomatic arch. Mandible is intact, and temporomandibular joints are located bilaterally. Orbits: Nondisplaced fractures of the lateral wall of the left orbit and anterior/lateral aspect of the orbital floor, as above. There is gas in the inferior aspect of the left orbit. This appears predominantly extraconal, however, coronal image 27 of series 10 demonstrates 1 tiny locule of gas which could conceivably be intraconal. No definite herniation of orbital contents or findings to suggest entrapment of extraocular muscles. Sinuses: High attenuation fluid lying dependently in the left maxillary sinus, compatible with hemosinus. Small amount of dependent fluid also noted in the  left frontal sinus. Soft tissues: High attenuation soft tissue swelling in the left periorbital region, compatible with a hematoma. Subcutaneous emphysema and emphysema in the deeper soft tissue is adjacent to the left maxilla with some gas in the deep soft tissues of the face posterior to the left maxillary sinus. CT CERVICAL SPINE FINDINGS Alignment: Normal. Skull base and vertebrae: No acute fracture. No primary bone lesion or focal pathologic process. Soft tissues and spinal canal: No prevertebral fluid or swelling. No visible canal hematoma. Disc levels: Multilevel degenerative disc disease, most apparent at C6-C7. Moderate multilevel facet arthropathy. Upper chest: Areas of mild pleuroparenchymal scarring in the apices of the thorax bilaterally. Other: None. IMPRESSION: 1. Small cortical hemorrhage in the Ing left frontal lobe just lateral to the falx cerebri, as above. 2. Acute facial fractures involving the left maxilla, ethmoid and temporal bones, including nondisplaced fractures through the lateral wall and anterior/lateral aspect of the left orbital floor, minimally displaced fracture through the posterior aspect of the left zygomatic arch as well as numerous displaced fractures of the left maxillary sinus, as detailed above. Hematoma, contusion and gas in the superficial soft tissues. 3. There is gas within the left orbit which is predominantly extraconal, however, there is one small locule of gas which appears to be intraconal. 4. No evidence  of significant acute traumatic injury to the cervical spine. 5. Chronic microvascular ischemic changes in the cerebral white matter, as above. 6. Mild multilevel degenerative disc disease and cervical spondylosis, as above. Critical Value/emergent results were called by telephone at the time of interpretation on 08/02/2017 at 12:50 pm to Dr. Daleen Bo, who verbally acknowledged these results. Electronically Signed   By: Vinnie Langton M.D.   On: 08/02/2017  12:56   Ct Maxillofacial Wo Cm  Result Date: 08/02/2017 CLINICAL DATA:  83 year old female with syncopal episode yesterday while standing with history of trauma from a fall with injury to the face and head. Laceration and bruising around a left thigh. Headache today. EXAM: CT HEAD WITHOUT CONTRAST CT MAXILLOFACIAL WITHOUT CONTRAST CT CERVICAL SPINE WITHOUT CONTRAST TECHNIQUE: Multidetector CT imaging of the head, cervical spine, and maxillofacial structures were performed using the standard protocol without intravenous contrast. Multiplanar CT image reconstructions of the cervical spine and maxillofacial structures were also generated. COMPARISON:  Brain MRI 05/31/2017.  Head CT 09/16/2016. FINDINGS: CT HEAD FINDINGS Brain: Small focus of high attenuation measuring 9 x 7 x 6 mm (axial image 22 of series 4 and coronal image 21 of series 6) in the left frontal cortex adjacent to the falx cerebri. Patchy and confluent areas of decreased attenuation are noted throughout the deep and periventricular white matter of the cerebral hemispheres bilaterally, compatible with chronic microvascular ischemic disease. No evidence of acute infarction, hydrocephalus, extra-axial collection or mass lesion/mass effect. Vascular: No hyperdense vessel or unexpected calcification. Skull: Facial bone fractures, as detailed below. Other: Subcutaneous emphysema in the left side of the face in the left periorbital region, as discussed below. CT MAXILLOFACIAL FINDINGS Osseous: Multiple acute fractures of the left side of the face. This includes a nondisplaced fracture through the anterior and lateral aspect of the left orbital floor, and a nondisplaced fracture through the lateral wall of the left orbit. Multiple fractures of the left maxillary sinus, including displaced comminuted fractures of the lateral/posterior wall which invaginate into the sinus, displaced and comminuted fractures of the anterior wall of the left maxillary sinus  which also invaginated into the sinus, and subtle minimally displaced fractures of the medial wall of the sinus immediately inferior to the anterior aspect of the inferior turbinate. Notably, the fracture of the lateral wall the left maxillary sinus involves the lateral aspect of the alveolar ridge. Acute minimally displaced fracture through the posterior aspect of the left zygomatic arch. Mandible is intact, and temporomandibular joints are located bilaterally. Orbits: Nondisplaced fractures of the lateral wall of the left orbit and anterior/lateral aspect of the orbital floor, as above. There is gas in the inferior aspect of the left orbit. This appears predominantly extraconal, however, coronal image 27 of series 10 demonstrates 1 tiny locule of gas which could conceivably be intraconal. No definite herniation of orbital contents or findings to suggest entrapment of extraocular muscles. Sinuses: High attenuation fluid lying dependently in the left maxillary sinus, compatible with hemosinus. Small amount of dependent fluid also noted in the left frontal sinus. Soft tissues: High attenuation soft tissue swelling in the left periorbital region, compatible with a hematoma. Subcutaneous emphysema and emphysema in the deeper soft tissue is adjacent to the left maxilla with some gas in the deep soft tissues of the face posterior to the left maxillary sinus. CT CERVICAL SPINE FINDINGS Alignment: Normal. Skull base and vertebrae: No acute fracture. No primary bone lesion or focal pathologic process. Soft tissues and spinal canal: No prevertebral  fluid or swelling. No visible canal hematoma. Disc levels: Multilevel degenerative disc disease, most apparent at C6-C7. Moderate multilevel facet arthropathy. Upper chest: Areas of mild pleuroparenchymal scarring in the apices of the thorax bilaterally. Other: None. IMPRESSION: 1. Small cortical hemorrhage in the Ing left frontal lobe just lateral to the falx cerebri, as above.  2. Acute facial fractures involving the left maxilla, ethmoid and temporal bones, including nondisplaced fractures through the lateral wall and anterior/lateral aspect of the left orbital floor, minimally displaced fracture through the posterior aspect of the left zygomatic arch as well as numerous displaced fractures of the left maxillary sinus, as detailed above. Hematoma, contusion and gas in the superficial soft tissues. 3. There is gas within the left orbit which is predominantly extraconal, however, there is one small locule of gas which appears to be intraconal. 4. No evidence of significant acute traumatic injury to the cervical spine. 5. Chronic microvascular ischemic changes in the cerebral white matter, as above. 6. Mild multilevel degenerative disc disease and cervical spondylosis, as above. Critical Value/emergent results were called by telephone at the time of interpretation on 08/02/2017 at 12:50 pm to Dr. Daleen Bo, who verbally acknowledged these results. Electronically Signed   By: Vinnie Langton M.D.   On: 08/02/2017 12:56    ROS Blood pressure (!) 149/70, pulse 86, temperature 97.9 F (36.6 C), temperature source Oral, resp. rate 17, height 5' (1.524 m), weight 55.8 kg (123 lb), SpO2 100 %. Physical Exam HEENT, L periorbital eccymosis and left frontal contusion and abrasion, neck, min tender with no pain with AROM rotate RT and LT, right UE badly bruised and shortened, unable to move shoulder due to pain.  Elbow and wrist with no deformity and no pain with PROM, skin intact right UE Left UE and Bilateral LE with normal AROM and no pain, several superficial lacerations to the left hand, no deformity, min pain with logroll right hip  Assessment/Plan: Right displaced and comminuted humeral shaft fracture with extension into the head/neck region.  Coaptation splint applied.  Will re-XRAY tomorrow to check alignment.  Will consult with Dr Laurelyn Sickle regarding possible surgical  intervention.  Dr Amedeo Plenty fixed her broken left wrist previously. Pelvis XRAY may have small nondisplaced pubic ramus fractures which would not alter WB but might explain some groin and hip pain on the right side. Will follow. NWB R UE please.  [name],STEVEN R 08/02/2017, 3:53 PM

## 2017-08-02 NOTE — H&P (Addendum)
History   [name] is an 83 y.o. female.   Chief Complaint:  Chief Complaint  Patient presents with  . Loss of Consciousness    83 year old female, ground level fall at least twice, once in church yesterday morning after which she was fine and participated in after church activities.  She lives alone and after she went home she talked to her daughter at about 8:00 PM and she was fine.  This morning at about 9:30AM  Her daughter called again and the patient stated that she may need an ambulance.  When she arrive the daughter found her to be on the floor sitting, with a bruised and deformed right upper arm and left facial bruising.   Trauma Mechanism of injury: fall Injury location: face, head/neck and shoulder/arm Injury location detail: head, face, L eye and L cheek and R upper arm Incident location: parking lot of her Dr.'s office. Time since incident: 12 hours Arrived directly from scene: yes   Fall:      Fall occurred: walking and standing (may have had an event prior to fall)      Point of impact: face and head      Entrapped after fall: no  Protective equipment:       None      Suspicion of alcohol use: no      Suspicion of drug use: no  EMS/PTA data:      Bystander interventions: none      Ambulatory at scene: no      Blood loss: moderate      Responsiveness: alert      Oriented to: person, place, situation and time      Amnesic to event: yes      Airway interventions: none      Breathing interventions: oxygen      IV access: established      IO access: none      Fluids administered: normal saline      Cardiac interventions: none      Medications administered: none      Immobilization: none      Airway condition since incident: stable      Breathing condition since incident: stable      Circulation condition since incident: stable      Mental status condition since incident: stable      Disability condition since incident: stable  Current  symptoms:      Pain scale: 5/10      Pain quality: sharp      Pain timing: constant      Associated symptoms:            Denies abdominal pain.   Relevant PMH:      Medical risk factors:            Prior stroke      Tetanus status: unknown      The patient has been admitted to the hospital due to injury in the past year.      The patient has not been treated and released from the ED due to injury in the past year.   Past Medical History:  Diagnosis Date  . Benign positional vertigo   . Bursitis started 10-2011   left leg and left hip  . Clostridium difficile diarrhea    hx of 10 years ago  . DJD (degenerative joint disease)   . Family history of adverse reaction to anesthesia    daughter has nausea  .  Hypertension   . Leg cramps   . Osteoporosis   . Pneumonia    as a baby  . PONV (postoperative nausea and vomiting)   . Stroke ([place])   . Varicose veins     Past Surgical History:  Procedure Laterality Date  . ABDOMINAL HYSTERECTOMY    . APPENDECTOMY    . CATARACT EXTRACTION     right  . COLONOSCOPY    . HERNIA REPAIR    . KYPHOSIS SURGERY    . ORIF WRIST FRACTURE Left 12/24/2016   Procedure: OPEN REDUCTION INTERNAL FIXATION (ORIF) LEFT WRIST FRACTURE;  Surgeon: Roseanne Kaufman, MD;  Location: [facility];  Service: Orthopedics;  Laterality: Left;  . TUBAL LIGATION     bilateral    Family History  Problem Relation Age of Onset  . Alzheimer's disease Mother   . Stroke Mother   . Coronary artery disease Father   . Ulcers Father    Social History:  reports that she has never smoked. She has never used smokeless tobacco. She reports that she does not drink alcohol or use drugs.  Allergies   Allergies  Allergen Reactions  . Ciprofloxacin Other (See Comments)    unknown  . Tetanus Toxoids Other (See Comments)    unknown  . Amoxicillin Diarrhea  . Clindamycin/Lincomycin Diarrhea  . Codeine Nausea And Vomiting  . Penicillins Hives, Swelling, Rash and Other (See  Comments)    Has patient had a PCN reaction causing immediate rash, facial/tongue/throat swelling, SOB or lightheadedness with hypotension: swelling of arm, and rash Has patient had a PCN reaction causing severe rash involving mucus membranes or skin necrosis: unknown Has patient had a PCN reaction that required hospitalization : unknown Has patient had a PCN reaction occurring within the last 10 years: no If all of the above answers are "NO", then may proceed with Cephalosporin use.   Marland Kitchen Ultracet [Tramadol-Acetaminophen] Nausea And Vomiting    Home Medications   (Not in a hospital admission)  Trauma Course   Results for orders placed or performed during the hospital encounter of 08/02/17 (from the past 48 hour(s))  CBG monitoring, ED     Status: Abnormal   Collection Time: 08/02/17 11:33 AM  Result Value Ref Range   Glucose-Capillary 137 (H) 65 - 99 mg/dL   Ct Head Wo Contrast  Result Date: 08/02/2017 CLINICAL DATA:  83 year old female with syncopal episode yesterday while standing with history of trauma from a fall with injury to the face and head. Laceration and bruising around a left thigh. Headache today. EXAM: CT HEAD WITHOUT CONTRAST CT MAXILLOFACIAL WITHOUT CONTRAST CT CERVICAL SPINE WITHOUT CONTRAST TECHNIQUE: Multidetector CT imaging of the head, cervical spine, and maxillofacial structures were performed using the standard protocol without intravenous contrast. Multiplanar CT image reconstructions of the cervical spine and maxillofacial structures were also generated. COMPARISON:  Brain MRI 05/31/2017.  Head CT 09/16/2016. FINDINGS: CT HEAD FINDINGS Brain: Small focus of high attenuation measuring 9 x 7 x 6 mm (axial image 22 of series 4 and coronal image 21 of series 6) in the left frontal cortex adjacent to the falx cerebri. Patchy and confluent areas of decreased attenuation are noted throughout the deep and periventricular white matter of the cerebral hemispheres bilaterally,  compatible with chronic microvascular ischemic disease. No evidence of acute infarction, hydrocephalus, extra-axial collection or mass lesion/mass effect. Vascular: No hyperdense vessel or unexpected calcification. Skull: Facial bone fractures, as detailed below. Other: Subcutaneous emphysema in the left side of the face  in the left periorbital region, as discussed below. CT MAXILLOFACIAL FINDINGS Osseous: Multiple acute fractures of the left side of the face. This includes a nondisplaced fracture through the anterior and lateral aspect of the left orbital floor, and a nondisplaced fracture through the lateral wall of the left orbit. Multiple fractures of the left maxillary sinus, including displaced comminuted fractures of the lateral/posterior wall which invaginate into the sinus, displaced and comminuted fractures of the anterior wall of the left maxillary sinus which also invaginated into the sinus, and subtle minimally displaced fractures of the medial wall of the sinus immediately inferior to the anterior aspect of the inferior turbinate. Notably, the fracture of the lateral wall the left maxillary sinus involves the lateral aspect of the alveolar ridge. Acute minimally displaced fracture through the posterior aspect of the left zygomatic arch. Mandible is intact, and temporomandibular joints are located bilaterally. Orbits: Nondisplaced fractures of the lateral wall of the left orbit and anterior/lateral aspect of the orbital floor, as above. There is gas in the inferior aspect of the left orbit. This appears predominantly extraconal, however, coronal image 27 of series 10 demonstrates 1 tiny locule of gas which could conceivably be intraconal. No definite herniation of orbital contents or findings to suggest entrapment of extraocular muscles. Sinuses: High attenuation fluid lying dependently in the left maxillary sinus, compatible with hemosinus. Small amount of dependent fluid also noted in the left frontal  sinus. Soft tissues: High attenuation soft tissue swelling in the left periorbital region, compatible with a hematoma. Subcutaneous emphysema and emphysema in the deeper soft tissue is adjacent to the left maxilla with some gas in the deep soft tissues of the face posterior to the left maxillary sinus. CT CERVICAL SPINE FINDINGS Alignment: Normal. Skull base and vertebrae: No acute fracture. No primary bone lesion or focal pathologic process. Soft tissues and spinal canal: No prevertebral fluid or swelling. No visible canal hematoma. Disc levels: Multilevel degenerative disc disease, most apparent at C6-C7. Moderate multilevel facet arthropathy. Upper chest: Areas of mild pleuroparenchymal scarring in the apices of the thorax bilaterally. Other: None. IMPRESSION: 1. Small cortical hemorrhage in the Ing left frontal lobe just lateral to the falx cerebri, as above. 2. Acute facial fractures involving the left maxilla, ethmoid and temporal bones, including nondisplaced fractures through the lateral wall and anterior/lateral aspect of the left orbital floor, minimally displaced fracture through the posterior aspect of the left zygomatic arch as well as numerous displaced fractures of the left maxillary sinus, as detailed above. Hematoma, contusion and gas in the superficial soft tissues. 3. There is gas within the left orbit which is predominantly extraconal, however, there is one small locule of gas which appears to be intraconal. 4. No evidence of significant acute traumatic injury to the cervical spine. 5. Chronic microvascular ischemic changes in the cerebral white matter, as above. 6. Mild multilevel degenerative disc disease and cervical spondylosis, as above. Critical Value/emergent results were called by telephone at the time of interpretation on 08/02/2017 at 12:50 pm to Dr. Daleen Bo, who verbally acknowledged these results. Electronically Signed   By: Vinnie Langton M.D.   On: 08/02/2017 12:56   Ct  Maxillofacial Wo Cm  Result Date: 08/02/2017 CLINICAL DATA:  83 year old female with syncopal episode yesterday while standing with history of trauma from a fall with injury to the face and head. Laceration and bruising around a left thigh. Headache today. EXAM: CT HEAD WITHOUT CONTRAST CT MAXILLOFACIAL WITHOUT CONTRAST CT CERVICAL SPINE WITHOUT CONTRAST TECHNIQUE:  Multidetector CT imaging of the head, cervical spine, and maxillofacial structures were performed using the standard protocol without intravenous contrast. Multiplanar CT image reconstructions of the cervical spine and maxillofacial structures were also generated. COMPARISON:  Brain MRI 05/31/2017.  Head CT 09/16/2016. FINDINGS: CT HEAD FINDINGS Brain: Small focus of high attenuation measuring 9 x 7 x 6 mm (axial image 22 of series 4 and coronal image 21 of series 6) in the left frontal cortex adjacent to the falx cerebri. Patchy and confluent areas of decreased attenuation are noted throughout the deep and periventricular white matter of the cerebral hemispheres bilaterally, compatible with chronic microvascular ischemic disease. No evidence of acute infarction, hydrocephalus, extra-axial collection or mass lesion/mass effect. Vascular: No hyperdense vessel or unexpected calcification. Skull: Facial bone fractures, as detailed below. Other: Subcutaneous emphysema in the left side of the face in the left periorbital region, as discussed below. CT MAXILLOFACIAL FINDINGS Osseous: Multiple acute fractures of the left side of the face. This includes a nondisplaced fracture through the anterior and lateral aspect of the left orbital floor, and a nondisplaced fracture through the lateral wall of the left orbit. Multiple fractures of the left maxillary sinus, including displaced comminuted fractures of the lateral/posterior wall which invaginate into the sinus, displaced and comminuted fractures of the anterior wall of the left maxillary sinus which also  invaginated into the sinus, and subtle minimally displaced fractures of the medial wall of the sinus immediately inferior to the anterior aspect of the inferior turbinate. Notably, the fracture of the lateral wall the left maxillary sinus involves the lateral aspect of the alveolar ridge. Acute minimally displaced fracture through the posterior aspect of the left zygomatic arch. Mandible is intact, and temporomandibular joints are located bilaterally. Orbits: Nondisplaced fractures of the lateral wall of the left orbit and anterior/lateral aspect of the orbital floor, as above. There is gas in the inferior aspect of the left orbit. This appears predominantly extraconal, however, coronal image 27 of series 10 demonstrates 1 tiny locule of gas which could conceivably be intraconal. No definite herniation of orbital contents or findings to suggest entrapment of extraocular muscles. Sinuses: High attenuation fluid lying dependently in the left maxillary sinus, compatible with hemosinus. Small amount of dependent fluid also noted in the left frontal sinus. Soft tissues: High attenuation soft tissue swelling in the left periorbital region, compatible with a hematoma. Subcutaneous emphysema and emphysema in the deeper soft tissue is adjacent to the left maxilla with some gas in the deep soft tissues of the face posterior to the left maxillary sinus. CT CERVICAL SPINE FINDINGS Alignment: Normal. Skull base and vertebrae: No acute fracture. No primary bone lesion or focal pathologic process. Soft tissues and spinal canal: No prevertebral fluid or swelling. No visible canal hematoma. Disc levels: Multilevel degenerative disc disease, most apparent at C6-C7. Moderate multilevel facet arthropathy. Upper chest: Areas of mild pleuroparenchymal scarring in the apices of the thorax bilaterally. Other: None. IMPRESSION: 1. Small cortical hemorrhage in the Ing left frontal lobe just lateral to the falx cerebri, as above. 2. Acute  facial fractures involving the left maxilla, ethmoid and temporal bones, including nondisplaced fractures through the lateral wall and anterior/lateral aspect of the left orbital floor, minimally displaced fracture through the posterior aspect of the left zygomatic arch as well as numerous displaced fractures of the left maxillary sinus, as detailed above. Hematoma, contusion and gas in the superficial soft tissues. 3. There is gas within the left orbit which is predominantly extraconal, however,  there is one small locule of gas which appears to be intraconal. 4. No evidence of significant acute traumatic injury to the cervical spine. 5. Chronic microvascular ischemic changes in the cerebral white matter, as above. 6. Mild multilevel degenerative disc disease and cervical spondylosis, as above. Critical Value/emergent results were called by telephone at the time of interpretation on 08/02/2017 at 12:50 pm to Dr. Daleen Bo, who verbally acknowledged these results. Electronically Signed   By: Vinnie Langton M.D.   On: 08/02/2017 12:56    Review of Systems  Constitutional: Negative.   Gastrointestinal: Negative for abdominal pain.  Genitourinary:       Smells of urine incontinence  Neurological: Positive for dizziness.  Psychiatric/Behavioral: Negative.     Blood pressure (!) 149/70, pulse 86, temperature 97.9 F (36.6 C), temperature source Oral, resp. rate 17, height 5' (1.524 m), weight 55.8 kg (123 lb), SpO2 100 %. Physical Exam  Vitals reviewed. Constitutional: She is oriented to person, place, and time.  HENT:  Head: Normocephalic. Head laceration: small laceration neatr zygoma.    Eyes: Pupils are equal, round, and reactive to light. Conjunctivae and EOM are normal.  Neck: Normal range of motion. Neck supple.  Cardiovascular: Normal rate, regular rhythm and normal heart sounds.   Respiratory: Effort normal and breath sounds normal.  GI: Soft. Bowel sounds are normal.    Musculoskeletal:       Right hip: She exhibits decreased range of motion (bruising all allong left hip) and swelling. She exhibits no deformity.       Right upper arm: She exhibits tenderness, bony tenderness, swelling, edema and deformity. She exhibits no laceration.       Legs: Neurological: She is alert and oriented to person, place, and time.  Skin: Skin is warm and dry.     Assessment/Plan  Ground level fall with Small left frontal intracerebral hemorrhage, right proximal humerus fracture, left orbital blowout fracture with maxillary sinus fracture, left zygomatic fracture  I have attempted to contact ENT/[facility] and orthopedics without success.  Will try again this evening.  All specialty consultants were able to see the patient after admission.   Jlyn Cerros 08/02/2017, 3:14 PM   Procedures

## 2017-08-03 ENCOUNTER — Inpatient Hospital Stay (HOSPITAL_COMMUNITY): Payer: Medicare Other

## 2017-08-03 LAB — CBC
HEMATOCRIT: 27.6 % — AB (ref 36.0–46.0)
HEMOGLOBIN: 8.9 g/dL — AB (ref 12.0–15.0)
MCH: 28.1 pg (ref 26.0–34.0)
MCHC: 32.2 g/dL (ref 30.0–36.0)
MCV: 87.1 fL (ref 78.0–100.0)
Platelets: 226 10*3/uL (ref 150–400)
RBC: 3.17 MIL/uL — AB (ref 3.87–5.11)
RDW: 14.8 % (ref 11.5–15.5)
WBC: 10.9 10*3/uL — AB (ref 4.0–10.5)

## 2017-08-03 LAB — BASIC METABOLIC PANEL
ANION GAP: 9 (ref 5–15)
BUN: 12 mg/dL (ref 6–20)
CHLORIDE: 105 mmol/L (ref 101–111)
CO2: 20 mmol/L — AB (ref 22–32)
Calcium: 8.3 mg/dL — ABNORMAL LOW (ref 8.9–10.3)
Creatinine, Ser: 0.74 mg/dL (ref 0.44–1.00)
GFR calc non Af Amer: >60 mL/min (ref >60)
Glucose, Bld: 89 mg/dL (ref 65–99)
POTASSIUM: 4 mmol/L (ref 3.5–5.1)
SODIUM: 134 mmol/L — AB (ref 135–145)

## 2017-08-03 LAB — MRSA PCR SCREENING: MRSA BY PCR: NEGATIVE

## 2017-08-03 MED ORDER — ACETAMINOPHEN 325 MG PO TABS
650.0000 mg | ORAL_TABLET | Freq: Four times a day (QID) | ORAL | Status: DC | PRN
  Administered 2017-08-03 – 2017-08-06 (×4): 650 mg via ORAL
  Filled 2017-08-03 (×4): qty 2

## 2017-08-03 NOTE — Progress Notes (Signed)
Orthopedics Progress Note  Subjective: Still in pain with the right shoulder/arm. Improved over yesterday.  Objective:  Vitals:   08/03/17 1100 08/03/17 1228  BP: (!) 140/48 137/66  Pulse:    Resp: 15 20  Temp:  98.8 F (37.1 C)    General: Awake and alert  Musculoskeletal: right UE immobilized in Coaptation splint, mild swelling in the fingers, NVI Neurovascularly intact  Lab Results  Component Value Date   WBC 10.9 (H) 08/03/2017   HGB 8.9 (L) 08/03/2017   HCT 27.6 (L) 08/03/2017   MCV 87.1 08/03/2017   PLT 226 08/03/2017       Component Value Date/Time   NA 134 (L) 08/03/2017 0251   K 4.0 08/03/2017 0251   CL 105 08/03/2017 0251   CO2 20 (L) 08/03/2017 0251   GLUCOSE 89 08/03/2017 0251   BUN 12 08/03/2017 0251   CREATININE 0.74 08/03/2017 0251   CALCIUM 8.3 (L) 08/03/2017 0251   GFRNONAA >60 08/03/2017 0251   GFRAA >60 08/03/2017 0251    Lab Results  Component Value Date   INR 1.02 09/16/2016   INR 1.5 10/18/2007   INR 1.2 10/15/2007    Assessment/Plan:  s/p fall with displaced and comminuted proximal humerus fracture. Discussed with Dr Amedeo Plenty who agrees that a trial of nonoperative care with the coaptation splint.  Supportive care and pain control. Wiggle fingers and elevate arm.  NWB R UE. Agree with need for SNF once stable  Remo Lipps R. Veverly Fells, MD 08/03/2017 2:12 PM

## 2017-08-03 NOTE — Progress Notes (Signed)
Trauma Service Note  Subjective: Patient seems to be doing much better today.  Much more responsive.  Seems to be hearing better also  Appreciate consultations from ortho, ENT and NS.  Objective: Vital signs in last 24 hours: Temp:  [97.9 F (36.6 C)-99.3 F (37.4 C)] 98.5 F (36.9 C) (08/07 0742) Pulse Rate:  [65-98] 91 (08/07 0742) Resp:  [10-26] 18 (08/07 0742) BP: (108-152)/(46-79) 108/55 (08/07 0742) SpO2:  [94 %-100 %] 98 % (08/07 0742) Weight:  [55.8 kg (123 lb)-58.9 kg (129 lb 12.8 oz)] 58.9 kg (129 lb 12.8 oz) (08/06 2200) Last BM Date: 08/01/17  Intake/Output from previous day: 08/06 0701 - 08/07 0700 In: 1916.3 [I.V.:1916.3] Out: 300 [Urine:300] Intake/Output this shift: No intake/output data recorded.  General: No acute distress.  Pain mostly in her right upper extremity  Lungs: Clear to auscultation.  Has a nagging cough this AM  Abd: Benign  Extremities: No changes.  Right upper extremity in splint.  Will see if Dr. Amedeo Plenty will consider operative management  Neuro: Intact.  No repeat head CT needed.  Clinically she is stable.  Lab Results: CBC   Recent Labs  08/02/17 1644 08/03/17 0251  WBC 13.5* 10.9*  HGB 9.3* 8.9*  HCT 28.6* 27.6*  PLT 231 226   BMET  Recent Labs  08/02/17 1644 08/03/17 0251  NA 134* 134*  K 4.0 4.0  CL 105 105  CO2 20* 20*  GLUCOSE 104* 89  BUN 15 12  CREATININE 0.73 0.74  CALCIUM 8.2* 8.3*   PT/INR No results for input(s): LABPROT, INR in the last 72 hours. ABG No results for input(s): PHART, HCO3 in the last 72 hours.  Invalid input(s): PCO2, PO2  Studies/Results: Dg Pelvis 1-2 Views  Result Date: 08/02/2017 CLINICAL DATA:  83 year old female with pelvic pain EXAM: PELVIS - 1-2 VIEW COMPARISON:  None. FINDINGS: There is no evidence of pelvic fracture or diastasis. No pelvic bone lesions are seen. Moderately large rectal stool ball. IMPRESSION: 1. No evidence of acute fracture or malalignment. 2. Moderately  large rectal stool ball. Does the patient have a history of constipation? Electronically Signed   By: Jacqulynn Cadet M.D.   On: 08/02/2017 13:05   Dg Shoulder Right  Result Date: 08/02/2017 CLINICAL DATA:  83 year old female with syncope EXAM: RIGHT SHOULDER - 2+ VIEW COMPARISON:  No prior FINDINGS: Acute comminuted fracture of the proximal humerus. Osteopenia. No displaced fracture of the visualized clavicle. IMPRESSION: Acute comminuted fracture of the proximal right humerus Electronically Signed   By: Corrie Mckusick D.O.   On: 08/02/2017 13:03   Ct Head Wo Contrast  Result Date: 08/02/2017 CLINICAL DATA:  83 year old female with syncopal episode yesterday while standing with history of trauma from a fall with injury to the face and head. Laceration and bruising around a left thigh. Headache today. EXAM: CT HEAD WITHOUT CONTRAST CT MAXILLOFACIAL WITHOUT CONTRAST CT CERVICAL SPINE WITHOUT CONTRAST TECHNIQUE: Multidetector CT imaging of the head, cervical spine, and maxillofacial structures were performed using the standard protocol without intravenous contrast. Multiplanar CT image reconstructions of the cervical spine and maxillofacial structures were also generated. COMPARISON:  Brain MRI 05/31/2017.  Head CT 09/16/2016. FINDINGS: CT HEAD FINDINGS Brain: Small focus of high attenuation measuring 9 x 7 x 6 mm (axial image 22 of series 4 and coronal image 21 of series 6) in the left frontal cortex adjacent to the falx cerebri. Patchy and confluent areas of decreased attenuation are noted throughout the deep and periventricular white  matter of the cerebral hemispheres bilaterally, compatible with chronic microvascular ischemic disease. No evidence of acute infarction, hydrocephalus, extra-axial collection or mass lesion/mass effect. Vascular: No hyperdense vessel or unexpected calcification. Skull: Facial bone fractures, as detailed below. Other: Subcutaneous emphysema in the left side of the face in the  left periorbital region, as discussed below. CT MAXILLOFACIAL FINDINGS Osseous: Multiple acute fractures of the left side of the face. This includes a nondisplaced fracture through the anterior and lateral aspect of the left orbital floor, and a nondisplaced fracture through the lateral wall of the left orbit. Multiple fractures of the left maxillary sinus, including displaced comminuted fractures of the lateral/posterior wall which invaginate into the sinus, displaced and comminuted fractures of the anterior wall of the left maxillary sinus which also invaginated into the sinus, and subtle minimally displaced fractures of the medial wall of the sinus immediately inferior to the anterior aspect of the inferior turbinate. Notably, the fracture of the lateral wall the left maxillary sinus involves the lateral aspect of the alveolar ridge. Acute minimally displaced fracture through the posterior aspect of the left zygomatic arch. Mandible is intact, and temporomandibular joints are located bilaterally. Orbits: Nondisplaced fractures of the lateral wall of the left orbit and anterior/lateral aspect of the orbital floor, as above. There is gas in the inferior aspect of the left orbit. This appears predominantly extraconal, however, coronal image 27 of series 10 demonstrates 1 tiny locule of gas which could conceivably be intraconal. No definite herniation of orbital contents or findings to suggest entrapment of extraocular muscles. Sinuses: High attenuation fluid lying dependently in the left maxillary sinus, compatible with hemosinus. Small amount of dependent fluid also noted in the left frontal sinus. Soft tissues: High attenuation soft tissue swelling in the left periorbital region, compatible with a hematoma. Subcutaneous emphysema and emphysema in the deeper soft tissue is adjacent to the left maxilla with some gas in the deep soft tissues of the face posterior to the left maxillary sinus. CT CERVICAL SPINE  FINDINGS Alignment: Normal. Skull base and vertebrae: No acute fracture. No primary bone lesion or focal pathologic process. Soft tissues and spinal canal: No prevertebral fluid or swelling. No visible canal hematoma. Disc levels: Multilevel degenerative disc disease, most apparent at C6-C7. Moderate multilevel facet arthropathy. Upper chest: Areas of mild pleuroparenchymal scarring in the apices of the thorax bilaterally. Other: None. IMPRESSION: 1. Small cortical hemorrhage in the Ing left frontal lobe just lateral to the falx cerebri, as above. 2. Acute facial fractures involving the left maxilla, ethmoid and temporal bones, including nondisplaced fractures through the lateral wall and anterior/lateral aspect of the left orbital floor, minimally displaced fracture through the posterior aspect of the left zygomatic arch as well as numerous displaced fractures of the left maxillary sinus, as detailed above. Hematoma, contusion and gas in the superficial soft tissues. 3. There is gas within the left orbit which is predominantly extraconal, however, there is one small locule of gas which appears to be intraconal. 4. No evidence of significant acute traumatic injury to the cervical spine. 5. Chronic microvascular ischemic changes in the cerebral white matter, as above. 6. Mild multilevel degenerative disc disease and cervical spondylosis, as above. Critical Value/emergent results were called by telephone at the time of interpretation on 08/02/2017 at 12:50 pm to Dr. Daleen Bo, who verbally acknowledged these results. Electronically Signed   By: Vinnie Langton M.D.   On: 08/02/2017 12:56   Ct Cervical Spine Wo Contrast  Result Date: 08/02/2017  CLINICAL DATA:  83 year old female with syncopal episode yesterday while standing with history of trauma from a fall with injury to the face and head. Laceration and bruising around a left thigh. Headache today. EXAM: CT HEAD WITHOUT CONTRAST CT MAXILLOFACIAL WITHOUT  CONTRAST CT CERVICAL SPINE WITHOUT CONTRAST TECHNIQUE: Multidetector CT imaging of the head, cervical spine, and maxillofacial structures were performed using the standard protocol without intravenous contrast. Multiplanar CT image reconstructions of the cervical spine and maxillofacial structures were also generated. COMPARISON:  Brain MRI 05/31/2017.  Head CT 09/16/2016. FINDINGS: CT HEAD FINDINGS Brain: Small focus of high attenuation measuring 9 x 7 x 6 mm (axial image 22 of series 4 and coronal image 21 of series 6) in the left frontal cortex adjacent to the falx cerebri. Patchy and confluent areas of decreased attenuation are noted throughout the deep and periventricular white matter of the cerebral hemispheres bilaterally, compatible with chronic microvascular ischemic disease. No evidence of acute infarction, hydrocephalus, extra-axial collection or mass lesion/mass effect. Vascular: No hyperdense vessel or unexpected calcification. Skull: Facial bone fractures, as detailed below. Other: Subcutaneous emphysema in the left side of the face in the left periorbital region, as discussed below. CT MAXILLOFACIAL FINDINGS Osseous: Multiple acute fractures of the left side of the face. This includes a nondisplaced fracture through the anterior and lateral aspect of the left orbital floor, and a nondisplaced fracture through the lateral wall of the left orbit. Multiple fractures of the left maxillary sinus, including displaced comminuted fractures of the lateral/posterior wall which invaginate into the sinus, displaced and comminuted fractures of the anterior wall of the left maxillary sinus which also invaginated into the sinus, and subtle minimally displaced fractures of the medial wall of the sinus immediately inferior to the anterior aspect of the inferior turbinate. Notably, the fracture of the lateral wall the left maxillary sinus involves the lateral aspect of the alveolar ridge. Acute minimally displaced  fracture through the posterior aspect of the left zygomatic arch. Mandible is intact, and temporomandibular joints are located bilaterally. Orbits: Nondisplaced fractures of the lateral wall of the left orbit and anterior/lateral aspect of the orbital floor, as above. There is gas in the inferior aspect of the left orbit. This appears predominantly extraconal, however, coronal image 27 of series 10 demonstrates 1 tiny locule of gas which could conceivably be intraconal. No definite herniation of orbital contents or findings to suggest entrapment of extraocular muscles. Sinuses: High attenuation fluid lying dependently in the left maxillary sinus, compatible with hemosinus. Small amount of dependent fluid also noted in the left frontal sinus. Soft tissues: High attenuation soft tissue swelling in the left periorbital region, compatible with a hematoma. Subcutaneous emphysema and emphysema in the deeper soft tissue is adjacent to the left maxilla with some gas in the deep soft tissues of the face posterior to the left maxillary sinus. CT CERVICAL SPINE FINDINGS Alignment: Normal. Skull base and vertebrae: No acute fracture. No primary bone lesion or focal pathologic process. Soft tissues and spinal canal: No prevertebral fluid or swelling. No visible canal hematoma. Disc levels: Multilevel degenerative disc disease, most apparent at C6-C7. Moderate multilevel facet arthropathy. Upper chest: Areas of mild pleuroparenchymal scarring in the apices of the thorax bilaterally. Other: None. IMPRESSION: 1. Small cortical hemorrhage in the Ing left frontal lobe just lateral to the falx cerebri, as above. 2. Acute facial fractures involving the left maxilla, ethmoid and temporal bones, including nondisplaced fractures through the lateral wall and anterior/lateral aspect of the left orbital  floor, minimally displaced fracture through the posterior aspect of the left zygomatic arch as well as numerous displaced fractures of the  left maxillary sinus, as detailed above. Hematoma, contusion and gas in the superficial soft tissues. 3. There is gas within the left orbit which is predominantly extraconal, however, there is one small locule of gas which appears to be intraconal. 4. No evidence of significant acute traumatic injury to the cervical spine. 5. Chronic microvascular ischemic changes in the cerebral white matter, as above. 6. Mild multilevel degenerative disc disease and cervical spondylosis, as above. Critical Value/emergent results were called by telephone at the time of interpretation on 08/02/2017 at 12:50 pm to Dr. Daleen Bo, who verbally acknowledged these results. Electronically Signed   By: Vinnie Langton M.D.   On: 08/02/2017 12:56   Dg Hand Complete Left  Result Date: 08/02/2017 CLINICAL DATA:  Multiple falls today. EXAM: LEFT HAND - COMPLETE 3+ VIEW COMPARISON:  None. FINDINGS: Evaluation of the distal left second finger is limited by overlying metallic sensor. Surgical plate with multiple interlocking screws is partially visualized in the distal left radius, with no evidence of hardware fracture or loosening. No acute osseous fracture. No dislocation. Severe osteoarthritis at the first carpometacarpal joint. Diffuse osteopenia. No suspicious focal osseous lesions. IMPRESSION: 1. No evidence of acute osseous fracture or dislocation in the left hand, with limitations as described. 2. Severe osteoarthritis at the first carpometacarpal joint. 3. Diffuse osteopenia. Electronically Signed   By: Ilona Sorrel M.D.   On: 08/02/2017 17:44   Ct Maxillofacial Wo Cm  Result Date: 08/02/2017 CLINICAL DATA:  83 year old female with syncopal episode yesterday while standing with history of trauma from a fall with injury to the face and head. Laceration and bruising around a left thigh. Headache today. EXAM: CT HEAD WITHOUT CONTRAST CT MAXILLOFACIAL WITHOUT CONTRAST CT CERVICAL SPINE WITHOUT CONTRAST TECHNIQUE: Multidetector CT  imaging of the head, cervical spine, and maxillofacial structures were performed using the standard protocol without intravenous contrast. Multiplanar CT image reconstructions of the cervical spine and maxillofacial structures were also generated. COMPARISON:  Brain MRI 05/31/2017.  Head CT 09/16/2016. FINDINGS: CT HEAD FINDINGS Brain: Small focus of high attenuation measuring 9 x 7 x 6 mm (axial image 22 of series 4 and coronal image 21 of series 6) in the left frontal cortex adjacent to the falx cerebri. Patchy and confluent areas of decreased attenuation are noted throughout the deep and periventricular white matter of the cerebral hemispheres bilaterally, compatible with chronic microvascular ischemic disease. No evidence of acute infarction, hydrocephalus, extra-axial collection or mass lesion/mass effect. Vascular: No hyperdense vessel or unexpected calcification. Skull: Facial bone fractures, as detailed below. Other: Subcutaneous emphysema in the left side of the face in the left periorbital region, as discussed below. CT MAXILLOFACIAL FINDINGS Osseous: Multiple acute fractures of the left side of the face. This includes a nondisplaced fracture through the anterior and lateral aspect of the left orbital floor, and a nondisplaced fracture through the lateral wall of the left orbit. Multiple fractures of the left maxillary sinus, including displaced comminuted fractures of the lateral/posterior wall which invaginate into the sinus, displaced and comminuted fractures of the anterior wall of the left maxillary sinus which also invaginated into the sinus, and subtle minimally displaced fractures of the medial wall of the sinus immediately inferior to the anterior aspect of the inferior turbinate. Notably, the fracture of the lateral wall the left maxillary sinus involves the lateral aspect of the alveolar ridge. Acute minimally  displaced fracture through the posterior aspect of the left zygomatic arch. Mandible  is intact, and temporomandibular joints are located bilaterally. Orbits: Nondisplaced fractures of the lateral wall of the left orbit and anterior/lateral aspect of the orbital floor, as above. There is gas in the inferior aspect of the left orbit. This appears predominantly extraconal, however, coronal image 27 of series 10 demonstrates 1 tiny locule of gas which could conceivably be intraconal. No definite herniation of orbital contents or findings to suggest entrapment of extraocular muscles. Sinuses: High attenuation fluid lying dependently in the left maxillary sinus, compatible with hemosinus. Small amount of dependent fluid also noted in the left frontal sinus. Soft tissues: High attenuation soft tissue swelling in the left periorbital region, compatible with a hematoma. Subcutaneous emphysema and emphysema in the deeper soft tissue is adjacent to the left maxilla with some gas in the deep soft tissues of the face posterior to the left maxillary sinus. CT CERVICAL SPINE FINDINGS Alignment: Normal. Skull base and vertebrae: No acute fracture. No primary bone lesion or focal pathologic process. Soft tissues and spinal canal: No prevertebral fluid or swelling. No visible canal hematoma. Disc levels: Multilevel degenerative disc disease, most apparent at C6-C7. Moderate multilevel facet arthropathy. Upper chest: Areas of mild pleuroparenchymal scarring in the apices of the thorax bilaterally. Other: None. IMPRESSION: 1. Small cortical hemorrhage in the Ing left frontal lobe just lateral to the falx cerebri, as above. 2. Acute facial fractures involving the left maxilla, ethmoid and temporal bones, including nondisplaced fractures through the lateral wall and anterior/lateral aspect of the left orbital floor, minimally displaced fracture through the posterior aspect of the left zygomatic arch as well as numerous displaced fractures of the left maxillary sinus, as detailed above. Hematoma, contusion and gas in the  superficial soft tissues. 3. There is gas within the left orbit which is predominantly extraconal, however, there is one small locule of gas which appears to be intraconal. 4. No evidence of significant acute traumatic injury to the cervical spine. 5. Chronic microvascular ischemic changes in the cerebral white matter, as above. 6. Mild multilevel degenerative disc disease and cervical spondylosis, as above. Critical Value/emergent results were called by telephone at the time of interpretation on 08/02/2017 at 12:50 pm to Dr. Daleen Bo, who verbally acknowledged these results. Electronically Signed   By: Vinnie Langton M.D.   On: 08/02/2017 12:56    Anti-infectives: Anti-infectives    None      Assessment/Plan: s/p  d/c foley Advance diet consider transfer later this AM  LOS: 1 day   Kathryne Eriksson. Dahlia Bailiff, MD, FACS [phone] Trauma Surgeon 08/03/2017

## 2017-08-03 NOTE — Care Management Note (Addendum)
Case Management Note  Patient Details  Name: MANASVI DICKARD MRN: [id number] Date of Birth: 07-03-1928  Subjective/Objective:    Pt admitted on 08/02/17 s/p  ground level fall with Small left frontal intracerebral hemorrhage, right proximal humerus fracture, left orbital blowout fracture with maxillary sinus fracture, and left zygomatic fracture.  PTA, pt independent, lives at home alone.   PCP is Dr. Lajean Manes.              Action/Plan: PT/OT consults pending.  Will follow for recommendations.    Expected Discharge Date:                  Expected Discharge Plan:     In-House Referral:  Clinical Social Work  Discharge planning Services  CM Consult  Post Acute Care Choice:    Choice offered to:     DME Arranged:    DME Agency:     HH Arranged:    HH Agency:     Status of Service:  In process, will continue to follow  If discussed at Long Length of Stay Meetings, dates discussed:    Additional Comments:  Reinaldo Raddle, RN, BSN  Trauma/Neuro ICU Case Manager [phone]

## 2017-08-04 MED ORDER — OXYCODONE HCL 5 MG PO TABS
ORAL_TABLET | ORAL | Status: AC
  Filled 2017-08-04: qty 2

## 2017-08-04 MED ORDER — OXYCODONE HCL 5 MG PO TABS
5.0000 mg | ORAL_TABLET | ORAL | Status: DC | PRN
Start: 2017-08-04 — End: 2017-08-06
  Administered 2017-08-04 – 2017-08-05 (×3): 10 mg via ORAL
  Filled 2017-08-04 (×3): qty 2

## 2017-08-04 MED ORDER — OXYCODONE HCL 5 MG PO TABS
5.0000 mg | ORAL_TABLET | ORAL | Status: DC | PRN
  Administered 2017-08-04 (×2): 10 mg via ORAL
  Filled 2017-08-04: qty 2

## 2017-08-04 MED ORDER — POLYETHYLENE GLYCOL 3350 17 G PO PACK
17.0000 g | PACK | Freq: Every day | ORAL | Status: DC
  Administered 2017-08-05: 17 g via ORAL
  Filled 2017-08-04 (×3): qty 1

## 2017-08-04 NOTE — Plan of Care (Signed)
Problem: Safety: Goal: Ability to remain free from injury will improve Outcome: Progressing High risk for fall with injury, placed on low bed with fall mats and all other fall risk protocol.  Problem: Pain Managment: Goal: General experience of comfort will improve Outcome: Not Progressing Increased pain during the night, trauma MD notified and oxycodone ordered, resting comfortably at this time.  Problem: Skin Integrity: Goal: Risk for impaired skin integrity will decrease Outcome: Progressing Able to turn self in bed, assisted with turns as needed.  Problem: Tissue Perfusion: Goal: Risk factors for ineffective tissue perfusion will decrease Outcome: Not Progressing Refusing SCDs d/t leg cramps and no pharmacologic VTE prophylaxis ordered by MD d/t bleed.  Problem: Activity: Goal: Risk for activity intolerance will decrease Outcome: Progressing Able to sit on side of bed, did not get out of bed d/t increased pain.

## 2017-08-04 NOTE — NC FL2 (Signed)
[place] LEVEL OF CARE SCREENING TOOL     IDENTIFICATION  Patient Name: [name] Birthdate: 04-26-1928 Sex: female Admission Date (Current Location): 08/02/2017  [facility] and [place] Number:  Herbalist and Address:  [facility], [place] [address], [place],  [address]      Provider Number: [id number]  Attending Physician Name and Address:  Md, Trauma, MD  Relative Name and Phone Number:       Current Level of Care: Hospital Recommended Level of Care: [place] Prior Approval Number:    Date Approved/Denied:   PASRR Number: [id number]  Discharge Plan: SNF    Current Diagnoses: Patient Active Problem List   Diagnosis Date Noted  . Intracranial hemorrhage ([place]) 08/02/2017  . Hyperlipemia 04/22/2017  . Radius and ulna distal fracture, left, closed, initial encounter 12/24/2016  . Cerebral thrombosis with cerebral infarction 09/17/2016  . Acute CVA (cerebrovascular accident) ([place]) 09/17/2016  . CVA (cerebral infarction) 09/16/2016  . Facial droop-left 09/16/2016  . Slurred speech 09/16/2016  . Hypertension   . Benign positional vertigo   . Essential hypertension   . Varicose veins of lower extremities with other complications 40/34/7425  . Pain in limb 01/06/2012    Orientation RESPIRATION BLADDER Height & Weight     Self, Time, Situation, Place  Normal Incontinent, External catheter Weight: 129 lb 12.8 oz (58.9 kg) (Simultaneous filing. User may not have seen previous data.) Height:  5' (152.4 cm) (Simultaneous filing. User may not have seen previous data.)  BEHAVIORAL SYMPTOMS/MOOD NEUROLOGICAL BOWEL NUTRITION STATUS      Incontinent    AMBULATORY STATUS COMMUNICATION OF NEEDS Skin   Extensive Assist Verbally Skin abrasions                       Personal Care Assistance Level of Assistance  Bathing, Dressing Bathing Assistance: Maximum assistance   Dressing Assistance:  Maximum assistance     Functional Limitations Info  Hearing   Hearing Info: Impaired (hearing aids)      [place]  PT (By licensed PT), OT (By licensed OT)     PT Frequency: 5x/wk OT Frequency: 5x/wk            Contractures      Additional Factors Info  Code Status, Allergies Code Status Info: Full Allergies Info: Atorvastatin, Amoxicillin, Ciprofloxacin, Clindamycin/lincomycin, Codeine, Penicillins, Ultracet Tramadol-acetaminophen, Tetanus Toxoids           Current Medications (08/04/2017):  This is the current hospital active medication list Current Facility-Administered Medications  Medication Dose Route Frequency Provider Last Rate Last Dose  . 0.9 %  sodium chloride infusion   Intravenous Continuous Judeth Horn, MD 50 mL/hr at 08/03/17 1534    . acetaminophen (TYLENOL) tablet 650 mg  650 mg Oral Q6H PRN Judeth Horn, MD   650 mg at 08/03/17 1259  . bisacodyl (DULCOLAX) suppository 10 mg  10 mg Rectal Daily PRN Rayburn, Kelly A, PA-C      . metoprolol tartrate (LOPRESSOR) injection 5 mg  5 mg Intravenous Q6H PRN Rayburn, Kelly A, PA-C      . ondansetron (ZOFRAN-ODT) disintegrating tablet 4 mg  4 mg Oral Q6H PRN Rayburn, Kelly A, PA-C   4 mg at 08/03/17 1259   Or  . ondansetron (ZOFRAN) injection 4 mg  4 mg Intravenous Q6H PRN Rayburn, Kelly A, PA-C   4 mg at 08/03/17 0520  . oxyCODONE (Oxy  IR/ROXICODONE) immediate release tablet 5-10 mg  5-10 mg Oral Q4H PRN Clovis Riley, MD   10 mg at 08/04/17 0226  . polyethylene glycol (MIRALAX / GLYCOLAX) packet 17 g  17 g Oral Daily Judeth Horn, MD      . sodium chloride (OCEAN) 0.65 % nasal spray 4 spray  4 spray Each Nare Q1H PRN Jerrell Belfast, MD   4 spray at 08/04/17 0931  . zolpidem (AMBIEN) tablet 5 mg  5 mg Oral QHS PRN Georganna Skeans, MD   5 mg at 08/03/17 2207     Discharge Medications: Please see discharge summary for a list of discharge medications.  Relevant Imaging  Results:  Relevant Lab Results:   Additional Information SS#: [id number]  Geralynn Ochs, LCSW

## 2017-08-04 NOTE — Evaluation (Signed)
Occupational Therapy Evaluation Patient Details Name: [name] MRN: [id number] DOB: August 27, 1928 Today's Date: 08/04/2017    History of Present Illness 83 year old female, ground level fall at least twice with resultant Small left frontal intracerebral hemorrhage, right proximal humerus fracture, left orbital blowout fracture with maxillary sinus fracture, left zygomatic fracture and possible small nondisplaced pubic ramus fractures which would not alter WB. PHMx: Benign positional vertigo, CVA, and right wrist fx. Treating RUE conservatively with splint and sling   Clinical Impression   This 83 yo female admitted for above presents to acute OT with deficits below (see OT problem list) thus affecting her PLOF of living alone. She will benefit from acute OT with follow up OT at SNF to work back towards PLOF.     Follow Up Recommendations  CIR    Equipment Recommendations  Other (comment) (TBD at next venue)       Precautions / Restrictions Precautions Precautions: Fall Required Braces or Orthoses: Sling Restrictions Weight Bearing Restrictions: Yes RUE Weight Bearing: Non weight bearing      Mobility Bed Mobility Overal bed mobility: Needs Assistance Bed Mobility: Supine to Sit     Supine to sit: Mod assist;+2 for physical assistance        Transfers Overall transfer level: Needs assistance Equipment used: 2 person hand held assist Transfers: Sit to/from [facility] Sit to Stand: Mod assist;+2 physical assistance Stand pivot transfers: Mod assist;+2 physical assistance            Balance Overall balance assessment: Needs assistance Sitting-balance support: Single extremity supported;Feet supported Sitting balance-Leahy Scale: Poor   Postural control: Posterior lean;Left lateral lean Standing balance support: Single extremity supported Standing balance-Leahy Scale: Poor                             ADL either performed or  assessed with clinical judgement   ADL Overall ADL's : Needs assistance/impaired Eating/Feeding: Moderate assistance Eating/Feeding Details (indicate cue type and reason): supported sitting Grooming: Maximal assistance Grooming Details (indicate cue type and reason): supported sitting Upper Body Bathing: Total assistance Upper Body Bathing Details (indicate cue type and reason): supported sitting Lower Body Bathing: Total assistance Lower Body Bathing Details (indicate cue type and reason): Mod A +2 sit<>stand Upper Body Dressing : Total assistance Upper Body Dressing Details (indicate cue type and reason): supported sitting Lower Body Dressing: Total assistance Lower Body Dressing Details (indicate cue type and reason): Mod A +2 sit<>stand Toilet Transfer: Moderate assistance;+2 for physical assistance;Stand-pivot Toilet Transfer Details (indicate cue type and reason): bed>recliner Toileting- Clothing Manipulation and Hygiene: Total assistance Toileting - Clothing Manipulation Details (indicate cue type and reason): Mod A +2 sit<>stand             Vision Patient Visual Report: No change from baseline              Pertinent Vitals/Pain Pain Assessment: 0-10 Pain Score: 7  Pain Location: RUE Pain Descriptors / Indicators: Grimacing;Guarding;Moaning Pain Intervention(s): Monitored during session;Repositioned;Limited activity within patient's tolerance     Hand Dominance Right   Extremity/Trunk Assessment Upper Extremity Assessment Upper Extremity Assessment: RUE deficits/detail RUE Deficits / Details: proximal humerus fx with splint and wrap all the way down to wrist RUE Coordination: decreased fine motor;decreased gross motor   Lower Extremity Assessment Lower Extremity Assessment: Defer to PT evaluation       Communication Communication Communication: No difficulties   Cognition Arousal/Alertness: Awake/alert  Behavior During Therapy: WFL for tasks  assessed/performed Overall Cognitive Status: Within Functional Limits for tasks assessed                                                Home Living Family/patient expects to be discharged to:: Skilled nursing facility Living Arrangements: Alone                                      Prior Functioning/Environment          Comments: was completely independent prior to admission living alone        OT Problem List: Decreased strength;Decreased range of motion;Impaired balance (sitting and/or standing);Impaired UE functional use;Pain      OT Treatment/Interventions: Self-care/ADL training;Therapeutic exercise;Patient/family education;DME and/or AE instruction;Balance training    OT Goals(Current goals can be found in the care plan section) Acute Rehab OT Goals OT Goal Formulation: With patient Time For Goal Achievement: 08/11/17 Potential to Achieve Goals: Good  OT Frequency: Min 2X/week   Barriers to D/C: Decreased caregiver support          Co-evaluation PT/OT/SLP Co-Evaluation/Treatment: Yes (partial) Reason for Co-Treatment: Complexity of the patient's impairments (multi-system involvement);For patient/therapist safety;To address functional/ADL transfers   OT goals addressed during session: Strengthening/ROM      AM-PAC PT "6 Clicks" Daily Activity     Outcome Measure Help from another person eating meals?: A Lot Help from another person taking care of personal grooming?: A Lot Help from another person toileting, which includes using toliet, bedpan, or urinal?: Total Help from another person bathing (including washing, rinsing, drying)?: Total Help from another person to put on and taking off regular upper body clothing?: Total Help from another person to put on and taking off regular lower body clothing?: Total 6 Click Score: 8   End of Session Nurse Communication: Mobility status  Activity Tolerance: Patient tolerated treatment  well Patient left: in chair;with call bell/phone within reach;with chair alarm set;with family/visitor present  OT Visit Diagnosis: Unsteadiness on feet (R26.81);History of falling (Z91.81);Pain Pain - Right/Left: Right Pain - part of body: Shoulder                Time: 4628-6381 OT Time Calculation (min): 40 min Charges:  OT General Charges $OT Visit: 1 Procedure OT Evaluation $OT Eval Moderate Complexity: 1 Procedure OT Treatments $Therapeutic Activity: 8-22 mins [place], OTR/L [phone] 08/04/2017

## 2017-08-04 NOTE — Progress Notes (Signed)
Trauma Service Note  Subjective: Patient doing well.  Having a lot of pain in her right arm  Objective: Vital signs in last 24 hours: Temp:  [98.3 F (36.8 C)-99.6 F (37.6 C)] 98.6 F (37 C) (08/08 0500) Pulse Rate:  [88-100] 88 (08/08 1000) Resp:  [12-21] 15 (08/08 1000) BP: (125-154)/(43-76) 138/76 (08/08 1000) SpO2:  [98 %-100 %] 100 % (08/08 1000) Last BM Date: 08/03/17  Intake/Output from previous day: 08/07 0701 - 08/08 0700 In: 1158.3 [I.V.:1158.3] Out: 1200 [Urine:1200] Intake/Output this shift: No intake/output data recorded.  General: Moderated acute distress in right arm  Lungs: Clear to auscultation  Abd: Benign.  No bowel movement  Extremities: No chagnes  Neuro: Intact  Lab Results: CBC   Recent Labs  08/02/17 1644 08/03/17 0251  WBC 13.5* 10.9*  HGB 9.3* 8.9*  HCT 28.6* 27.6*  PLT 231 226   BMET  Recent Labs  08/02/17 1644 08/03/17 0251  NA 134* 134*  K 4.0 4.0  CL 105 105  CO2 20* 20*  GLUCOSE 104* 89  BUN 15 12  CREATININE 0.73 0.74  CALCIUM 8.2* 8.3*   PT/INR No results for input(s): LABPROT, INR in the last 72 hours. ABG No results for input(s): PHART, HCO3 in the last 72 hours.  Invalid input(s): PCO2, PO2  Studies/Results: Dg Pelvis 1-2 Views  Result Date: 08/02/2017 CLINICAL DATA:  83 year old female with pelvic pain EXAM: PELVIS - 1-2 VIEW COMPARISON:  None. FINDINGS: There is no evidence of pelvic fracture or diastasis. No pelvic bone lesions are seen. Moderately large rectal stool ball. IMPRESSION: 1. No evidence of acute fracture or malalignment. 2. Moderately large rectal stool ball. Does the patient have a history of constipation? Electronically Signed   By: Jacqulynn Cadet M.D.   On: 08/02/2017 13:05   Dg Shoulder Right  Result Date: 08/02/2017 CLINICAL DATA:  83 year old female with syncope EXAM: RIGHT SHOULDER - 2+ VIEW COMPARISON:  No prior FINDINGS: Acute comminuted fracture of the proximal humerus.  Osteopenia. No displaced fracture of the visualized clavicle. IMPRESSION: Acute comminuted fracture of the proximal right humerus Electronically Signed   By: Corrie Mckusick D.O.   On: 08/02/2017 13:03   Ct Head Wo Contrast  Result Date: 08/02/2017 CLINICAL DATA:  83 year old female with syncopal episode yesterday while standing with history of trauma from a fall with injury to the face and head. Laceration and bruising around a left thigh. Headache today. EXAM: CT HEAD WITHOUT CONTRAST CT MAXILLOFACIAL WITHOUT CONTRAST CT CERVICAL SPINE WITHOUT CONTRAST TECHNIQUE: Multidetector CT imaging of the head, cervical spine, and maxillofacial structures were performed using the standard protocol without intravenous contrast. Multiplanar CT image reconstructions of the cervical spine and maxillofacial structures were also generated. COMPARISON:  Brain MRI 05/31/2017.  Head CT 09/16/2016. FINDINGS: CT HEAD FINDINGS Brain: Small focus of high attenuation measuring 9 x 7 x 6 mm (axial image 22 of series 4 and coronal image 21 of series 6) in the left frontal cortex adjacent to the falx cerebri. Patchy and confluent areas of decreased attenuation are noted throughout the deep and periventricular white matter of the cerebral hemispheres bilaterally, compatible with chronic microvascular ischemic disease. No evidence of acute infarction, hydrocephalus, extra-axial collection or mass lesion/mass effect. Vascular: No hyperdense vessel or unexpected calcification. Skull: Facial bone fractures, as detailed below. Other: Subcutaneous emphysema in the left side of the face in the left periorbital region, as discussed below. CT MAXILLOFACIAL FINDINGS Osseous: Multiple acute fractures of the left side of  the face. This includes a nondisplaced fracture through the anterior and lateral aspect of the left orbital floor, and a nondisplaced fracture through the lateral wall of the left orbit. Multiple fractures of the left maxillary sinus,  including displaced comminuted fractures of the lateral/posterior wall which invaginate into the sinus, displaced and comminuted fractures of the anterior wall of the left maxillary sinus which also invaginated into the sinus, and subtle minimally displaced fractures of the medial wall of the sinus immediately inferior to the anterior aspect of the inferior turbinate. Notably, the fracture of the lateral wall the left maxillary sinus involves the lateral aspect of the alveolar ridge. Acute minimally displaced fracture through the posterior aspect of the left zygomatic arch. Mandible is intact, and temporomandibular joints are located bilaterally. Orbits: Nondisplaced fractures of the lateral wall of the left orbit and anterior/lateral aspect of the orbital floor, as above. There is gas in the inferior aspect of the left orbit. This appears predominantly extraconal, however, coronal image 27 of series 10 demonstrates 1 tiny locule of gas which could conceivably be intraconal. No definite herniation of orbital contents or findings to suggest entrapment of extraocular muscles. Sinuses: High attenuation fluid lying dependently in the left maxillary sinus, compatible with hemosinus. Small amount of dependent fluid also noted in the left frontal sinus. Soft tissues: High attenuation soft tissue swelling in the left periorbital region, compatible with a hematoma. Subcutaneous emphysema and emphysema in the deeper soft tissue is adjacent to the left maxilla with some gas in the deep soft tissues of the face posterior to the left maxillary sinus. CT CERVICAL SPINE FINDINGS Alignment: Normal. Skull base and vertebrae: No acute fracture. No primary bone lesion or focal pathologic process. Soft tissues and spinal canal: No prevertebral fluid or swelling. No visible canal hematoma. Disc levels: Multilevel degenerative disc disease, most apparent at C6-C7. Moderate multilevel facet arthropathy. Upper chest: Areas of mild  pleuroparenchymal scarring in the apices of the thorax bilaterally. Other: None. IMPRESSION: 1. Small cortical hemorrhage in the Ing left frontal lobe just lateral to the falx cerebri, as above. 2. Acute facial fractures involving the left maxilla, ethmoid and temporal bones, including nondisplaced fractures through the lateral wall and anterior/lateral aspect of the left orbital floor, minimally displaced fracture through the posterior aspect of the left zygomatic arch as well as numerous displaced fractures of the left maxillary sinus, as detailed above. Hematoma, contusion and gas in the superficial soft tissues. 3. There is gas within the left orbit which is predominantly extraconal, however, there is one small locule of gas which appears to be intraconal. 4. No evidence of significant acute traumatic injury to the cervical spine. 5. Chronic microvascular ischemic changes in the cerebral white matter, as above. 6. Mild multilevel degenerative disc disease and cervical spondylosis, as above. Critical Value/emergent results were called by telephone at the time of interpretation on 08/02/2017 at 12:50 pm to Dr. Daleen Bo, who verbally acknowledged these results. Electronically Signed   By: Vinnie Langton M.D.   On: 08/02/2017 12:56   Ct Cervical Spine Wo Contrast  Result Date: 08/02/2017 CLINICAL DATA:  83 year old female with syncopal episode yesterday while standing with history of trauma from a fall with injury to the face and head. Laceration and bruising around a left thigh. Headache today. EXAM: CT HEAD WITHOUT CONTRAST CT MAXILLOFACIAL WITHOUT CONTRAST CT CERVICAL SPINE WITHOUT CONTRAST TECHNIQUE: Multidetector CT imaging of the head, cervical spine, and maxillofacial structures were performed using the standard protocol without intravenous  contrast. Multiplanar CT image reconstructions of the cervical spine and maxillofacial structures were also generated. COMPARISON:  Brain MRI 05/31/2017.  Head CT  09/16/2016. FINDINGS: CT HEAD FINDINGS Brain: Small focus of high attenuation measuring 9 x 7 x 6 mm (axial image 22 of series 4 and coronal image 21 of series 6) in the left frontal cortex adjacent to the falx cerebri. Patchy and confluent areas of decreased attenuation are noted throughout the deep and periventricular white matter of the cerebral hemispheres bilaterally, compatible with chronic microvascular ischemic disease. No evidence of acute infarction, hydrocephalus, extra-axial collection or mass lesion/mass effect. Vascular: No hyperdense vessel or unexpected calcification. Skull: Facial bone fractures, as detailed below. Other: Subcutaneous emphysema in the left side of the face in the left periorbital region, as discussed below. CT MAXILLOFACIAL FINDINGS Osseous: Multiple acute fractures of the left side of the face. This includes a nondisplaced fracture through the anterior and lateral aspect of the left orbital floor, and a nondisplaced fracture through the lateral wall of the left orbit. Multiple fractures of the left maxillary sinus, including displaced comminuted fractures of the lateral/posterior wall which invaginate into the sinus, displaced and comminuted fractures of the anterior wall of the left maxillary sinus which also invaginated into the sinus, and subtle minimally displaced fractures of the medial wall of the sinus immediately inferior to the anterior aspect of the inferior turbinate. Notably, the fracture of the lateral wall the left maxillary sinus involves the lateral aspect of the alveolar ridge. Acute minimally displaced fracture through the posterior aspect of the left zygomatic arch. Mandible is intact, and temporomandibular joints are located bilaterally. Orbits: Nondisplaced fractures of the lateral wall of the left orbit and anterior/lateral aspect of the orbital floor, as above. There is gas in the inferior aspect of the left orbit. This appears predominantly extraconal,  however, coronal image 27 of series 10 demonstrates 1 tiny locule of gas which could conceivably be intraconal. No definite herniation of orbital contents or findings to suggest entrapment of extraocular muscles. Sinuses: High attenuation fluid lying dependently in the left maxillary sinus, compatible with hemosinus. Small amount of dependent fluid also noted in the left frontal sinus. Soft tissues: High attenuation soft tissue swelling in the left periorbital region, compatible with a hematoma. Subcutaneous emphysema and emphysema in the deeper soft tissue is adjacent to the left maxilla with some gas in the deep soft tissues of the face posterior to the left maxillary sinus. CT CERVICAL SPINE FINDINGS Alignment: Normal. Skull base and vertebrae: No acute fracture. No primary bone lesion or focal pathologic process. Soft tissues and spinal canal: No prevertebral fluid or swelling. No visible canal hematoma. Disc levels: Multilevel degenerative disc disease, most apparent at C6-C7. Moderate multilevel facet arthropathy. Upper chest: Areas of mild pleuroparenchymal scarring in the apices of the thorax bilaterally. Other: None. IMPRESSION: 1. Small cortical hemorrhage in the Ing left frontal lobe just lateral to the falx cerebri, as above. 2. Acute facial fractures involving the left maxilla, ethmoid and temporal bones, including nondisplaced fractures through the lateral wall and anterior/lateral aspect of the left orbital floor, minimally displaced fracture through the posterior aspect of the left zygomatic arch as well as numerous displaced fractures of the left maxillary sinus, as detailed above. Hematoma, contusion and gas in the superficial soft tissues. 3. There is gas within the left orbit which is predominantly extraconal, however, there is one small locule of gas which appears to be intraconal. 4. No evidence of significant acute traumatic  injury to the cervical spine. 5. Chronic microvascular ischemic  changes in the cerebral white matter, as above. 6. Mild multilevel degenerative disc disease and cervical spondylosis, as above. Critical Value/emergent results were called by telephone at the time of interpretation on 08/02/2017 at 12:50 pm to Dr. Daleen Bo, who verbally acknowledged these results. Electronically Signed   By: Vinnie Langton M.D.   On: 08/02/2017 12:56   Dg Shoulder Right Port  Result Date: 08/03/2017 CLINICAL DATA:  Right proximal humerus fracture.  Initial encounter. EXAM: PORTABLE RIGHT SHOULDER COMPARISON:  Yesterday FINDINGS: Single AP view of the right shoulder shows unchanged comminuted fracturing of the upper humeral diaphysis with medial displacement. On prior radiograph, there is also anterior displacement. Located glenohumeral joint. IMPRESSION: Unchanged alignment of comminuted proximal humeral diaphysis fracture in the AP projection. Electronically Signed   By: Monte Fantasia M.D.   On: 08/03/2017 13:57   Dg Hand Complete Left  Result Date: 08/02/2017 CLINICAL DATA:  Multiple falls today. EXAM: LEFT HAND - COMPLETE 3+ VIEW COMPARISON:  None. FINDINGS: Evaluation of the distal left second finger is limited by overlying metallic sensor. Surgical plate with multiple interlocking screws is partially visualized in the distal left radius, with no evidence of hardware fracture or loosening. No acute osseous fracture. No dislocation. Severe osteoarthritis at the first carpometacarpal joint. Diffuse osteopenia. No suspicious focal osseous lesions. IMPRESSION: 1. No evidence of acute osseous fracture or dislocation in the left hand, with limitations as described. 2. Severe osteoarthritis at the first carpometacarpal joint. 3. Diffuse osteopenia. Electronically Signed   By: Ilona Sorrel M.D.   On: 08/02/2017 17:44   Ct Maxillofacial Wo Cm  Result Date: 08/02/2017 CLINICAL DATA:  83 year old female with syncopal episode yesterday while standing with history of trauma from a fall with  injury to the face and head. Laceration and bruising around a left thigh. Headache today. EXAM: CT HEAD WITHOUT CONTRAST CT MAXILLOFACIAL WITHOUT CONTRAST CT CERVICAL SPINE WITHOUT CONTRAST TECHNIQUE: Multidetector CT imaging of the head, cervical spine, and maxillofacial structures were performed using the standard protocol without intravenous contrast. Multiplanar CT image reconstructions of the cervical spine and maxillofacial structures were also generated. COMPARISON:  Brain MRI 05/31/2017.  Head CT 09/16/2016. FINDINGS: CT HEAD FINDINGS Brain: Small focus of high attenuation measuring 9 x 7 x 6 mm (axial image 22 of series 4 and coronal image 21 of series 6) in the left frontal cortex adjacent to the falx cerebri. Patchy and confluent areas of decreased attenuation are noted throughout the deep and periventricular white matter of the cerebral hemispheres bilaterally, compatible with chronic microvascular ischemic disease. No evidence of acute infarction, hydrocephalus, extra-axial collection or mass lesion/mass effect. Vascular: No hyperdense vessel or unexpected calcification. Skull: Facial bone fractures, as detailed below. Other: Subcutaneous emphysema in the left side of the face in the left periorbital region, as discussed below. CT MAXILLOFACIAL FINDINGS Osseous: Multiple acute fractures of the left side of the face. This includes a nondisplaced fracture through the anterior and lateral aspect of the left orbital floor, and a nondisplaced fracture through the lateral wall of the left orbit. Multiple fractures of the left maxillary sinus, including displaced comminuted fractures of the lateral/posterior wall which invaginate into the sinus, displaced and comminuted fractures of the anterior wall of the left maxillary sinus which also invaginated into the sinus, and subtle minimally displaced fractures of the medial wall of the sinus immediately inferior to the anterior aspect of the inferior turbinate.  Notably, the fracture  of the lateral wall the left maxillary sinus involves the lateral aspect of the alveolar ridge. Acute minimally displaced fracture through the posterior aspect of the left zygomatic arch. Mandible is intact, and temporomandibular joints are located bilaterally. Orbits: Nondisplaced fractures of the lateral wall of the left orbit and anterior/lateral aspect of the orbital floor, as above. There is gas in the inferior aspect of the left orbit. This appears predominantly extraconal, however, coronal image 27 of series 10 demonstrates 1 tiny locule of gas which could conceivably be intraconal. No definite herniation of orbital contents or findings to suggest entrapment of extraocular muscles. Sinuses: High attenuation fluid lying dependently in the left maxillary sinus, compatible with hemosinus. Small amount of dependent fluid also noted in the left frontal sinus. Soft tissues: High attenuation soft tissue swelling in the left periorbital region, compatible with a hematoma. Subcutaneous emphysema and emphysema in the deeper soft tissue is adjacent to the left maxilla with some gas in the deep soft tissues of the face posterior to the left maxillary sinus. CT CERVICAL SPINE FINDINGS Alignment: Normal. Skull base and vertebrae: No acute fracture. No primary bone lesion or focal pathologic process. Soft tissues and spinal canal: No prevertebral fluid or swelling. No visible canal hematoma. Disc levels: Multilevel degenerative disc disease, most apparent at C6-C7. Moderate multilevel facet arthropathy. Upper chest: Areas of mild pleuroparenchymal scarring in the apices of the thorax bilaterally. Other: None. IMPRESSION: 1. Small cortical hemorrhage in the Ing left frontal lobe just lateral to the falx cerebri, as above. 2. Acute facial fractures involving the left maxilla, ethmoid and temporal bones, including nondisplaced fractures through the lateral wall and anterior/lateral aspect of the left  orbital floor, minimally displaced fracture through the posterior aspect of the left zygomatic arch as well as numerous displaced fractures of the left maxillary sinus, as detailed above. Hematoma, contusion and gas in the superficial soft tissues. 3. There is gas within the left orbit which is predominantly extraconal, however, there is one small locule of gas which appears to be intraconal. 4. No evidence of significant acute traumatic injury to the cervical spine. 5. Chronic microvascular ischemic changes in the cerebral white matter, as above. 6. Mild multilevel degenerative disc disease and cervical spondylosis, as above. Critical Value/emergent results were called by telephone at the time of interpretation on 08/02/2017 at 12:50 pm to Dr. Daleen Bo, who verbally acknowledged these results. Electronically Signed   By: Vinnie Langton M.D.   On: 08/02/2017 12:56    Anti-infectives: Anti-infectives    None      Assessment/Plan: s/p  Advance diet Transfer to the floor  LOS: 2 days   Kathryne Eriksson. Dahlia Bailiff, MD, FACS [phone] Trauma Surgeon 08/04/2017

## 2017-08-04 NOTE — Plan of Care (Signed)
Problem: Pain Managment: Goal: General experience of comfort will improve Outcome: Progressing Oxycodone controlling patient's pain well.

## 2017-08-04 NOTE — Evaluation (Signed)
Physical Therapy Evaluation Patient Details Name: [name] MRN: [id number] DOB: Aug 21, 1928 Today's Date: 08/04/2017   History of Present Illness  83 year old female, ground level fall at least twice with resultant Small left frontal intracerebral hemorrhage, right proximal humerus fracture, left orbital blowout fracture with maxillary sinus fracture, left zygomatic fracture and possible small nondisplaced pubic ramus fractures which would not alter WB. PHMx: Benign positional vertigo, CVA, and right wrist fx. Treating RUE conservatively with splint and sling  Clinical Impression  Pt admitted with above diagnosis and presents to PT with functional limitations due to deficits listed below (See PT problem list). Pt needs skilled PT to maximize independence and safety to allow discharge to ST-SNF for further rehab before hopeful return to home. Expect progress to be slow due to pain and frailty.     Follow Up Recommendations SNF    Equipment Recommendations  Other (comment) (to be assessed at next venue)    Recommendations for Other Services       Precautions / Restrictions Precautions Precautions: Fall Required Braces or Orthoses: Sling Restrictions Weight Bearing Restrictions: Yes RUE Weight Bearing: Non weight bearing      Mobility  Bed Mobility Overal bed mobility: Needs Assistance Bed Mobility: Supine to Sit     Supine to sit: Mod assist;+2 for physical assistance     General bed mobility comments: Assist to elevate trunk into sitting and bring hips to EOB  Transfers Overall transfer level: Needs assistance Equipment used: 2 person hand held assist Transfers: Sit to/from [facility] Sit to Stand: Mod assist;+2 physical assistance Stand pivot transfers: Mod assist;+2 physical assistance       General transfer comment: Assist to bring hips up and for balance. Pt able to make small pivotal steps from bed to chair. Assist for balance and  support during pivotal steps  Ambulation/Gait                Stairs            Wheelchair Mobility    Modified Rankin (Stroke Patients Only)       Balance Overall balance assessment: Needs assistance Sitting-balance support: Single extremity supported;Feet supported Sitting balance-Leahy Scale: Poor   Postural control: Posterior lean;Left lateral lean Standing balance support: Single extremity supported Standing balance-Leahy Scale: Poor                               Pertinent Vitals/Pain Pain Assessment: 0-10 Pain Score: 7  Pain Location: RUE Pain Descriptors / Indicators: Grimacing;Guarding;Moaning Pain Intervention(s): Limited activity within patient's tolerance;Monitored during session;Repositioned    Home Living Family/patient expects to be discharged to:: Skilled nursing facility Living Arrangements: Alone                    Prior Function Level of Independence: Independent         Comments: was completely independent prior to admission living alone     Hand Dominance   Dominant Hand: Right    Extremity/Trunk Assessment   Upper Extremity Assessment Upper Extremity Assessment: Defer to OT evaluation RUE Deficits / Details: proximal humerus fx with splint and wrap all the way down to wrist RUE Coordination: decreased fine motor;decreased gross motor    Lower Extremity Assessment Lower Extremity Assessment: Generalized weakness    Cervical / Trunk Assessment Cervical / Trunk Assessment: Kyphotic  Communication   Communication: No difficulties  Cognition Arousal/Alertness: Awake/alert Behavior During  Therapy: WFL for tasks assessed/performed Overall Cognitive Status: Within Functional Limits for tasks assessed                                        General Comments      Exercises     Assessment/Plan    PT Assessment Patient needs continued PT services  PT Problem List Decreased  strength;Decreased activity tolerance;Decreased balance;Decreased mobility;Decreased knowledge of use of DME;Pain       PT Treatment Interventions DME instruction;Gait training;Functional mobility training;Therapeutic activities;Therapeutic exercise;Balance training;Patient/family education    PT Goals (Current goals can be found in the Care Plan section)  Acute Rehab PT Goals Patient Stated Goal: return home PT Goal Formulation: With patient Time For Goal Achievement: 08/18/17 Potential to Achieve Goals: Good    Frequency Min 3X/week   Barriers to discharge Decreased caregiver support Lives alone    Co-evaluation PT/OT/SLP Co-Evaluation/Treatment: Yes Reason for Co-Treatment: Complexity of the patient's impairments (multi-system involvement);For patient/therapist safety;To address functional/ADL transfers PT goals addressed during session: Mobility/safety with mobility;Balance OT goals addressed during session: Strengthening/ROM       AM-PAC PT "6 Clicks" Daily Activity  Outcome Measure Difficulty turning over in bed (including adjusting bedclothes, sheets and blankets)?: Total Difficulty moving from lying on back to sitting on the side of the bed? : Total Difficulty sitting down on and standing up from a chair with arms (e.g., wheelchair, bedside commode, etc,.)?: Total Help needed moving to and from a bed to chair (including a wheelchair)?: A Lot Help needed walking in hospital room?: Total Help needed climbing 3-5 steps with a railing? : Total 6 Click Score: 7    End of Session Equipment Utilized During Treatment: Other (comment) (sling) Activity Tolerance: Patient limited by pain Patient left: in chair;with call bell/phone within reach;with chair alarm set Nurse Communication: Mobility status PT Visit Diagnosis: Unsteadiness on feet (R26.81);Other abnormalities of gait and mobility (R26.89);Repeated falls (R29.6);Muscle weakness (generalized) (M62.81);Pain Pain -  Right/Left: Right Pain - part of body: Shoulder;Arm    Time: 6222-9798 PT Time Calculation (min) (ACUTE ONLY): 19 min   Charges:   PT Evaluation $PT Eval Moderate Complexity: 1 Mod     PT G CodesMarland Kitchen        [facility] PT [place] 08/04/2017, 12:36 PM

## 2017-08-04 NOTE — Progress Notes (Signed)
Patient is restless, yelling out in pain, crying, trying to get out of bed. Too soon for more fentanyl. Kae Heller, MD notified and to place orders for oxycodone.

## 2017-08-05 ENCOUNTER — Inpatient Hospital Stay (HOSPITAL_COMMUNITY): Payer: Medicare Other

## 2017-08-05 MED ORDER — VITAMIN B-12 1000 MCG PO TABS
1000.0000 ug | ORAL_TABLET | Freq: Every day | ORAL | Status: DC
  Administered 2017-08-06: 1000 ug via ORAL
  Filled 2017-08-05: qty 1

## 2017-08-05 MED ORDER — RISAQUAD PO CAPS
1.0000 | ORAL_CAPSULE | Freq: Every day | ORAL | Status: DC
  Administered 2017-08-06: 1 via ORAL
  Filled 2017-08-05: qty 1

## 2017-08-05 MED ORDER — LORATADINE 10 MG PO TABS
10.0000 mg | ORAL_TABLET | Freq: Every day | ORAL | Status: DC
  Administered 2017-08-06: 10 mg via ORAL
  Filled 2017-08-05: qty 1

## 2017-08-05 MED ORDER — LISINOPRIL 10 MG PO TABS
10.0000 mg | ORAL_TABLET | Freq: Every day | ORAL | Status: DC
  Administered 2017-08-06: 10 mg via ORAL
  Filled 2017-08-05: qty 1

## 2017-08-05 MED ORDER — ASPIRIN 325 MG PO TABS
325.0000 mg | ORAL_TABLET | Freq: Every day | ORAL | Status: DC
  Administered 2017-08-06: 325 mg via ORAL
  Filled 2017-08-05: qty 1

## 2017-08-05 MED ORDER — MAGNESIUM OXIDE 400 (241.3 MG) MG PO TABS
400.0000 mg | ORAL_TABLET | Freq: Every day | ORAL | Status: DC
  Administered 2017-08-06: 400 mg via ORAL
  Filled 2017-08-05: qty 1

## 2017-08-05 MED ORDER — SIMVASTATIN 10 MG PO TABS: 10.0000 mg | ORAL_TABLET | Freq: Every evening | ORAL | Status: DC

## 2017-08-05 MED ORDER — CALCIUM CARBONATE ANTACID 500 MG PO CHEW
3.0000 | CHEWABLE_TABLET | Freq: Every day | ORAL | Status: DC
  Administered 2017-08-06: 600 mg via ORAL
  Filled 2017-08-05: qty 3

## 2017-08-05 MED ORDER — MONTELUKAST SODIUM 10 MG PO TABS
10.0000 mg | ORAL_TABLET | Freq: Every day | ORAL | Status: DC
  Administered 2017-08-06: 10 mg via ORAL
  Filled 2017-08-05: qty 1

## 2017-08-05 MED ORDER — HAIR/SKIN/NAILS/BIOTIN PO TABS
1.0000 | ORAL_TABLET | Freq: Every day | ORAL | Status: DC
Start: 2017-08-06 — End: 2017-08-05

## 2017-08-05 MED ORDER — DOCUSATE SODIUM 100 MG PO CAPS
100.0000 mg | ORAL_CAPSULE | Freq: Two times a day (BID) | ORAL | Status: DC
  Administered 2017-08-05 – 2017-08-06 (×3): 100 mg via ORAL
  Filled 2017-08-05 (×3): qty 1

## 2017-08-05 MED ORDER — MUPIROCIN 2 % EX OINT: 1.0000 "application " | TOPICAL_OINTMENT | Freq: Three times a day (TID) | CUTANEOUS | Status: DC | PRN

## 2017-08-05 MED ORDER — MECLIZINE HCL 25 MG PO TABS: 25.0000 mg | ORAL_TABLET | Freq: Two times a day (BID) | ORAL | Status: DC | PRN

## 2017-08-05 MED ORDER — DEXTROAMPHETAMINE SULFATE 5 MG PO TABS
5.0000 mg | ORAL_TABLET | Freq: Every day | ORAL | Status: DC
  Administered 2017-08-06: 5 mg via ORAL
  Filled 2017-08-05: qty 1

## 2017-08-05 MED ORDER — SORBITOL 70 % SOLN
960.0000 mL | TOPICAL_OIL | Freq: Once | ORAL | Status: AC
  Administered 2017-08-05: 960 mL via RECTAL
  Filled 2017-08-05: qty 240

## 2017-08-05 MED ORDER — METHOCARBAMOL 500 MG PO TABS
500.0000 mg | ORAL_TABLET | Freq: Four times a day (QID) | ORAL | Status: DC | PRN
  Administered 2017-08-05: 500 mg via ORAL
  Filled 2017-08-05: qty 1

## 2017-08-05 MED ORDER — FLUTICASONE PROPIONATE 50 MCG/ACT NA SUSP
2.0000 | Freq: Every day | NASAL | Status: DC
  Administered 2017-08-06: 2 via NASAL
  Filled 2017-08-05: qty 16

## 2017-08-05 MED ORDER — LUTEIN 6 MG PO CAPS: 6.0000 mg | ORAL_CAPSULE | Freq: Every day | ORAL | Status: DC

## 2017-08-05 MED ORDER — ORAL CARE MOUTH RINSE
15.0000 mL | Freq: Two times a day (BID) | OROMUCOSAL | Status: DC
  Administered 2017-08-05 – 2017-08-06 (×3): 15 mL via OROMUCOSAL

## 2017-08-05 NOTE — Care Management Note (Signed)
Case Management Note  Patient Details  Name: [name] MRN: [id number] Date of Birth: December 17, 1928  Subjective/Objective:    Pt admitted on 08/02/17 s/p  ground level fall with Small left frontal intracerebral hemorrhage, right proximal humerus fracture, left orbital blowout fracture with maxillary sinus fracture, and left zygomatic fracture.  PTA, pt independent, lives at home alone.   PCP is Dr. Lajean Manes.              Action/Plan: PT/OT consults pending.  Will follow for recommendations.    Expected Discharge Date:                  Expected Discharge Plan:  Skilled Nursing Facility  In-House Referral:  Clinical Social Work  Discharge planning Services  CM Consult  Post Acute Care Choice:    Choice offered to:     DME Arranged:    DME Agency:     HH Arranged:    [employer] Agency:     Status of Service:  In process, will continue to follow  If discussed at Long Length of Stay Meetings, dates discussed:    Additional Comments: 08/05/17 J. Oren Section, RN, BSN PT/OT recommending SNF for rehab.  CSW following to facilitate dc to SNF upon medical stability.  Will follow progress.  Reinaldo Raddle, RN, BSN  Trauma/Neuro ICU Case Manager [phone]

## 2017-08-05 NOTE — Progress Notes (Addendum)
Paged by RN for patient crying out in excruciating pain for greater than 10 minutes. The orthopedic on call physician was paged by the RN multiple times without response and therefore the RN paged the primary (trauma) team. I came quickly to evaluate the patient. She was complaining of severe pain in her right upper extremity. Her distal pulses were strong and intact. I took the ace down to assess the soft tissue of the arm. Her lower arm was swollen and her upper arm while ecchymotic had no signs of ischemia or necrosis. The ace wrap was put in back in place from hand to shoulder to help with the distal edema issue. After re-wrap the patient appeared much more comfortable.  08/05/17 20:54 addendum Spoke to family who was upset that I had come to evaluate their loved one. They were concerned that someone other than the primary orthopedic specialist adjusted the dressing. I conveyed that I was assessing due to her large change in physical presentation and that the orthopedic on call specialist was not responding to previous pages. The family is concerned that the splint may have been altered or the bones less aligned.  -will get XR of arm in AM to assess

## 2017-08-05 NOTE — Plan of Care (Signed)
Problem: Pain Managment: Goal: General experience of comfort will improve Outcome: Progressing Discussed with patient, family and on-call MD about concerns for x-rays, pain managment and transfer to 6N with some teach back displayed.

## 2017-08-05 NOTE — Progress Notes (Signed)
[facility] Progress Note     Subjective: CC: feeling crummy Patient with pain in right shoulder and in her neck. States that right shoulder pain is similar to what it has been and she is not able to move her right arm much. Complaining of pain in her right hand as well. Neck pain is a spasm type pain and neck feels stiff. Patient denies abdominal pain, n/v, SOB. Can't remember if she had a BM yesterday or not. Per nursing staff, no BM in a few days.   Objective: Vital signs in last 24 hours: Temp:  [97.7 F (36.5 C)-99.2 F (37.3 C)] 97.7 F (36.5 C) (08/09 0411) Pulse Rate:  [86-104] 101 (08/09 0731) Resp:  [10-23] 19 (08/09 0731) BP: (101-138)/(46-76) 132/60 (08/09 0731) SpO2:  [96 %-100 %] 98 % (08/09 0411) Last BM Date: 08/04/17  Intake/Output from previous day: 08/08 0701 - 08/09 0700 In: 1300 [I.V.:1300] Out: 500 [Urine:500] Intake/Output this shift: No intake/output data recorded.  PE: Gen:  Alert, NAD, pleasant Eyes: Pupils equal and round. Significant ecchymosis around left eye. Sclera normal bilaterally.  Card:  Regular rate and rhythm, pedal pulses 2+ BL, no edema in bilateral LEs Pulm:  Normal effort, clear to auscultation bilaterally Abd: Soft, non-tender, mildly distended, bowel sounds present, no HSM MSK: RUE in ACE wrap and sling. Right hand is moderately edematous with ecchymosis. LUE normal.  Neuro: motor and sensory function intact in bilateral hands. Grip 5/5 bilaterally.  Skin: warm and dry, no rashes  Psych: A&Ox3   Lab Results:   Recent Labs  08/02/17 1644 08/03/17 0251  WBC 13.5* 10.9*  HGB 9.3* 8.9*  HCT 28.6* 27.6*  PLT 231 226   BMET  Recent Labs  08/02/17 1644 08/03/17 0251  NA 134* 134*  K 4.0 4.0  CL 105 105  CO2 20* 20*  GLUCOSE 104* 89  BUN 15 12  CREATININE 0.73 0.74  CALCIUM 8.2* 8.3*   CMP     Component Value Date/Time   NA 134 (L) 08/03/2017 0251   K 4.0 08/03/2017 0251   CL 105 08/03/2017 0251   CO2 20 (L) 08/03/2017 0251   GLUCOSE 89 08/03/2017 0251   BUN 12 08/03/2017 0251   CREATININE 0.74 08/03/2017 0251   CALCIUM 8.3 (L) 08/03/2017 0251   PROT 7.2 09/16/2016 1639   ALBUMIN 4.1 09/16/2016 1639   AST 22 09/16/2016 1639   ALT 16 09/16/2016 1639   ALKPHOS 51 09/16/2016 1639   BILITOT 0.4 09/16/2016 1639   GFRNONAA >60 08/03/2017 0251   GFRAA >60 08/03/2017 0251    Studies/Results: Dg Shoulder Right Port  Result Date: 08/03/2017 CLINICAL DATA:  Right proximal humerus fracture.  Initial encounter. EXAM: PORTABLE RIGHT SHOULDER COMPARISON:  Yesterday FINDINGS: Single AP view of the right shoulder shows unchanged comminuted fracturing of the upper humeral diaphysis with medial displacement. On prior radiograph, there is also anterior displacement. Located glenohumeral joint. IMPRESSION: Unchanged alignment of comminuted proximal humeral diaphysis fracture in the AP projection. Electronically Signed   By: Monte Fantasia M.D.   On: 08/03/2017 13:57    Assessment/Plan Fall from ground level Small left frontal ICH - NS recommended no surgical intervention and no follow up CT needed unless change in neuro status Right proximal humerus fracture - NWB on RUE - coaptation splint - PT/OT recommending SNF or CIR Left orbital blowout fracture with maxillary sinus fracture Left zygomatic fracture - ENT not recommending surgical intervention - elevate HOB, ice compress to periorbital  region, avoid nose blowing/sneezing - liquid and soft diet as tolerated - saline nasal spray 4x daily prn Right hand pain - films ordered   FEN - SOFT diet; robaxin for muscle spasms; SMOG enema for constipation VTE - SCDs, no lovenox due to small intracranial hemorrhage ID - no abx  Dispo: Right hand films. PT/OT recommending SNF vs CIR. Transfer to floor.   LOS: 3 days     Brigid Re , [facility] Surgery 08/05/2017, 7:42 AM Pager: [phone] Trauma Pager: [phone] Mon-Fri  7:00 am-4:30 pm Sat-Sun 7:00 am-11:30 am

## 2017-08-05 NOTE — Progress Notes (Signed)
Orthopedics Progress Note  Subjective: Patient still in a lot of pain with the right arm despite splinting  Objective:  Vitals:   08/05/17 0411 08/05/17 0731  BP: (!) 113/47 132/60  Pulse: 86 (!) 101  Resp: (!) 23 19  Temp: 97.7 F (36.5 C)     General: Awake and alert  Musculoskeletal: Right UE with significant hand swelling.Marland KitchenACE wrap was removed from the forearm and wrist/hand - hopefully that will improve the hand swelling.  I suspect that was due to a tourniquet effect from the wrap.  She is able to wiggle fingers well.  Will elevate and have OT work on that Neurovascularly intact  Lab Results  Component Value Date   WBC 10.9 (H) 08/03/2017   HGB 8.9 (L) 08/03/2017   HCT 27.6 (L) 08/03/2017   MCV 87.1 08/03/2017   PLT 226 08/03/2017       Component Value Date/Time   NA 134 (L) 08/03/2017 0251   K 4.0 08/03/2017 0251   CL 105 08/03/2017 0251   CO2 20 (L) 08/03/2017 0251   GLUCOSE 89 08/03/2017 0251   BUN 12 08/03/2017 0251   CREATININE 0.74 08/03/2017 0251   CALCIUM 8.3 (L) 08/03/2017 0251   GFRNONAA >60 08/03/2017 0251   GFRAA >60 08/03/2017 0251    Lab Results  Component Value Date   INR 1.02 09/16/2016   INR 1.5 10/18/2007   INR 1.2 10/15/2007    Assessment/Plan:  s/p ground level fall with displaced and comminuted proximal humeral shaft fracture - splinted with Coaptation splint but patient still in quite a bit of pain which is not uncommon honestly.  These patients typically do not start feeling much better for about two weeks.  This may prevent OT, PT from doing much for her at SNF.  Sill discuss with and update Dr Amedeo Plenty and talk with family about options including ORIF.  Patient stable and transferring to floor per trauma. Thanks   Doran Heater. Veverly Fells, MD 08/05/2017 7:36 AM

## 2017-08-05 NOTE — Progress Notes (Signed)
RN heard patient crying out in excruciating pain and went to assess patient. Patient arm and hand is swollen and red, warm to touch, +2 pulse felt. Arm was not elevated. Patient was unable to receive PRN medication. Paged Trauma and Ortho MDs on call.  Patient continued to cry out until Dr Kieth Brightly arrived

## 2017-08-05 NOTE — Progress Notes (Signed)
Orthopedics Progress Note  Subjective: Patient feeling some better this afternoon  Objective:  Vitals:   08/05/17 1147 08/05/17 1200  BP: (!) 114/54 120/68  Pulse: 85 87  Resp: 15 (!) 21  Temp: 97.8 F (36.6 C)   SpO2: 100% 99%    General: Awake and alert  Musculoskeletal: right arm with splint on, moderate swelling below the splint Neurovascularly intact  Lab Results  Component Value Date   WBC 10.9 (H) 08/03/2017   HGB 8.9 (L) 08/03/2017   HCT 27.6 (L) 08/03/2017   MCV 87.1 08/03/2017   PLT 226 08/03/2017       Component Value Date/Time   NA 134 (L) 08/03/2017 0251   K 4.0 08/03/2017 0251   CL 105 08/03/2017 0251   CO2 20 (L) 08/03/2017 0251   GLUCOSE 89 08/03/2017 0251   BUN 12 08/03/2017 0251   CREATININE 0.74 08/03/2017 0251   CALCIUM 8.3 (L) 08/03/2017 0251   GFRNONAA >60 08/03/2017 0251   GFRAA >60 08/03/2017 0251    Lab Results  Component Value Date   INR 1.02 09/16/2016   INR 1.5 10/18/2007   INR 1.2 10/15/2007    Assessment/Plan: Right comminuted and displaced proximal humerus shaft fracture I had a detailed discussion with patient and her two children concerning her current condition and expectations regarding pain and recovery.  I also discussed treatment options including ORIF.  They would like to continue with conservative (non-op) care at this time with the coaptation splint and supportive care.  Arrangements have been made at [facility] re[facility] in [place]. I loosened her ACE wrap around the antecubital area to improve circulation and hopefully help the swelling.  Continue current care.  I would need to see her back in the office in about 10 days once discharged from [facility] to [facility] R. Veverly Fells, MD 08/05/2017 1:45 PM

## 2017-08-06 ENCOUNTER — Encounter (HOSPITAL_COMMUNITY): Admission: EM | Disposition: A | Discharge status: Skilled Nursing Facility | Payer: Self-pay | Source: Home / Self Care

## 2017-08-06 ENCOUNTER — Inpatient Hospital Stay (HOSPITAL_COMMUNITY): Payer: Medicare Other

## 2017-08-06 DIAGNOSIS — S02401A Maxillary fracture, unspecified, initial encounter for closed fracture: Secondary | ICD-10-CM | POA: Diagnosis not present

## 2017-08-06 DIAGNOSIS — R070 Pain in throat: Secondary | ICD-10-CM | POA: Diagnosis not present

## 2017-08-06 DIAGNOSIS — Z9181 History of falling: Secondary | ICD-10-CM | POA: Diagnosis not present

## 2017-08-06 DIAGNOSIS — S61213A Laceration without foreign body of left middle finger without damage to nail, initial encounter: Secondary | ICD-10-CM | POA: Diagnosis not present

## 2017-08-06 DIAGNOSIS — M6281 Muscle weakness (generalized): Secondary | ICD-10-CM | POA: Diagnosis not present

## 2017-08-06 DIAGNOSIS — S0292XA Unspecified fracture of facial bones, initial encounter for closed fracture: Secondary | ICD-10-CM | POA: Diagnosis not present

## 2017-08-06 DIAGNOSIS — S062X0D Diffuse traumatic brain injury without loss of consciousness, subsequent encounter: Secondary | ICD-10-CM | POA: Diagnosis not present

## 2017-08-06 DIAGNOSIS — R41841 Cognitive communication deficit: Secondary | ICD-10-CM | POA: Diagnosis not present

## 2017-08-06 DIAGNOSIS — S42301A Unspecified fracture of shaft of humerus, right arm, initial encounter for closed fracture: Secondary | ICD-10-CM | POA: Diagnosis not present

## 2017-08-06 DIAGNOSIS — S42201D Unspecified fracture of upper end of right humerus, subsequent encounter for fracture with routine healing: Secondary | ICD-10-CM | POA: Diagnosis not present

## 2017-08-06 DIAGNOSIS — S06359A Traumatic hemorrhage of left cerebrum with loss of consciousness of unspecified duration, initial encounter: Secondary | ICD-10-CM | POA: Diagnosis not present

## 2017-08-06 DIAGNOSIS — S42201A Unspecified fracture of upper end of right humerus, initial encounter for closed fracture: Secondary | ICD-10-CM | POA: Diagnosis not present

## 2017-08-06 DIAGNOSIS — S06360A Traumatic hemorrhage of cerebrum, unspecified, without loss of consciousness, initial encounter: Secondary | ICD-10-CM | POA: Diagnosis not present

## 2017-08-06 DIAGNOSIS — S01112A Laceration without foreign body of left eyelid and periocular area, initial encounter: Secondary | ICD-10-CM | POA: Diagnosis not present

## 2017-08-06 DIAGNOSIS — W1830XA Fall on same level, unspecified, initial encounter: Secondary | ICD-10-CM | POA: Diagnosis not present

## 2017-08-06 DIAGNOSIS — S02402D Zygomatic fracture, unspecified, subsequent encounter for fracture with routine healing: Secondary | ICD-10-CM | POA: Diagnosis not present

## 2017-08-06 DIAGNOSIS — S6990XA Unspecified injury of unspecified wrist, hand and finger(s), initial encounter: Secondary | ICD-10-CM | POA: Diagnosis not present

## 2017-08-06 DIAGNOSIS — Z23 Encounter for immunization: Secondary | ICD-10-CM | POA: Diagnosis not present

## 2017-08-06 DIAGNOSIS — S0083XA Contusion of other part of head, initial encounter: Secondary | ICD-10-CM | POA: Diagnosis not present

## 2017-08-06 DIAGNOSIS — S42351D Displaced comminuted fracture of shaft of humerus, right arm, subsequent encounter for fracture with routine healing: Secondary | ICD-10-CM | POA: Diagnosis not present

## 2017-08-06 DIAGNOSIS — S0990XA Unspecified injury of head, initial encounter: Secondary | ICD-10-CM | POA: Diagnosis not present

## 2017-08-06 DIAGNOSIS — S0232XA Fracture of orbital floor, left side, initial encounter for closed fracture: Secondary | ICD-10-CM | POA: Diagnosis not present

## 2017-08-06 DIAGNOSIS — M25552 Pain in left hip: Secondary | ICD-10-CM | POA: Diagnosis not present

## 2017-08-06 DIAGNOSIS — R278 Other lack of coordination: Secondary | ICD-10-CM | POA: Diagnosis not present

## 2017-08-06 DIAGNOSIS — R2681 Unsteadiness on feet: Secondary | ICD-10-CM | POA: Diagnosis not present

## 2017-08-06 DIAGNOSIS — S42301D Unspecified fracture of shaft of humerus, right arm, subsequent encounter for fracture with routine healing: Secondary | ICD-10-CM | POA: Diagnosis not present

## 2017-08-06 DIAGNOSIS — R296 Repeated falls: Secondary | ICD-10-CM | POA: Diagnosis not present

## 2017-08-06 DIAGNOSIS — I10 Essential (primary) hypertension: Secondary | ICD-10-CM | POA: Diagnosis not present

## 2017-08-06 SURGERY — OPEN REDUCTION INTERNAL FIXATION (ORIF) PROXIMAL HUMERUS FRACTURE
Anesthesia: Choice | Laterality: Right

## 2017-08-06 MED ORDER — OXYCODONE HCL 5 MG PO TABS: 5.0000 mg | ORAL_TABLET | ORAL | 0 refills | Status: DC | PRN

## 2017-08-06 MED ORDER — WHITE PETROLATUM GEL
Status: AC
  Administered 2017-08-06: 11:00:00
  Filled 2017-08-06: qty 1

## 2017-08-06 MED ORDER — DOCUSATE SODIUM 100 MG PO CAPS: 100.0000 mg | ORAL_CAPSULE | Freq: Two times a day (BID) | ORAL | 0 refills | Status: DC

## 2017-08-06 MED ORDER — ACETAMINOPHEN 325 MG PO TABS: 650.0000 mg | ORAL_TABLET | Freq: Four times a day (QID) | ORAL | Status: DC | PRN

## 2017-08-06 NOTE — Progress Notes (Signed)
Patient for discharge to [facility]. Report given to [place] using SBAR. All questions were answered

## 2017-08-06 NOTE — Clinical Social Work Placement (Signed)
   CLINICAL SOCIAL WORK PLACEMENT  NOTE  Date:  08/06/2017  Patient Details  Name: [name] MRN: [id number] Date of Birth: May 25, 1928  Clinical Social Work is seeking post-discharge placement for this patient at the [place] level of care (*CSW will initial, date and re-position this form in  chart as items are completed):  Yes   Patient/family provided with [place] Work Department's list of facilities offering this level of care within the geographic area requested by the patient (or if unable, by the patient's family).  Yes   Patient/family informed of their freedom to choose among providers that offer the needed level of care, that participate in Medicare, Medicaid or managed care program needed by the patient, have an available bed and are willing to accept the patient.  Yes   Patient/family informed of [place]'s ownership interest in [facility] and [facility], as well as of the fact that they are under no obligation to receive care at these facilities.  PASRR submitted to EDS on 08/05/17     PASRR number received on 08/05/17     Existing PASRR number confirmed on       FL2 transmitted to all facilities in geographic area requested by pt/family on 08/05/17     FL2 transmitted to all facilities within larger geographic area on       Patient informed that his/her managed care company has contracts with or will negotiate with certain facilities, including the following:        Yes   Patient/family informed of bed offers received.  Patient chooses bed at [place], [place]     Physician recommends and patient chooses bed at      Patient to be transferred to [facility] on 08/06/17.  Patient to be transferred to facility by Ambulance     Patient family notified on 08/06/17 of transfer.  Name of family member notified:  Patient daughter Jocelyn Lamer at bedside     PHYSICIAN Please prepare priority  discharge summary, including medications     Additional Comment:   Barbette Or, [place]

## 2017-08-06 NOTE — Clinical Social Work Note (Signed)
Clinical Social Work Assessment  Patient Details  Name: [name] MRN: [id number] Date of Birth: 1928/05/14  Date of referral:  08/05/17               Reason for consult:  Trauma, Facility Placement                Permission sought to share information with:  Family Supports Permission granted to share information::  Yes, Verbal Permission Granted  Name::     [name]  Relationship::  Daughter  Contact Information:  [phone]  Housing/Transportation Living arrangements for the past 2 months:  [place] of Information:  Patient, Adult Children Patient Interpreter Needed:  None Criminal Activity/Legal Involvement Pertinent to Current Situation/Hospitalization:  No - Comment as needed Significant Relationships:  Adult Children, Other Family Members Lives with:  Self Do you feel safe going back to the place where you live?  Yes Need for family participation in patient care:  Yes (Comment)  Care giving concerns:  Patient daughter at bedside and expressed concerns about managing patient pain in the facility.  CSW reassured patient daughter that patient would discharge on a medication regimen that the doctor feels most appropriate to meet patient needs - patient daughter satisfied.   Social Worker assessment / plan:  Holiday representative met with patient and patient daughter at bedside to offer support and discuss patient needs at discharge.  Patient and patient daughter both state that patient lives at home primarily alone and will need SNF placement at discharge.  Patient lives in [place] area and has preference to [facility].  CSW spoke with admissions coordinator who was able to review referral and extend a bed offer.  CSW remains available for support and to facilitate patient discharge needs once medically stable.  Employment status:  Retired Forensic scientist:  [employer] PT Recommendations:  [place] / Referral to  community resources:  [place], SBIRT  Patient/Family's Response to care:  Patient and daughter both verbalized appreciation for CSW support and involvement and are agreeable with SNF placement at [facility].  Patient with good family support and plans to transition home following a rehab stay.  Patient/Family's Understanding of and Emotional Response to Diagnosis, Current Treatment, and Prognosis:  Patient and family understanding of patient injuries and the need for continued rehab prior to return home.  Patient with several limitations at this time due to the injuries.  Emotional Assessment Appearance:  Appears stated age Attitude/Demeanor/Rapport:   (Engaged, Appropriate) Affect (typically observed):  Accepting, Appropriate, Calm, Hopeful, Pleasant Orientation:  Oriented to Self, Oriented to Situation, Oriented to Place, Oriented to  Time Alcohol / Substance use:  Never Used Psych involvement (Current and /or in the community):  No (Comment)  Discharge Needs  Concerns to be addressed:  No discharge needs identified Readmission within the last 30 days:  No Current discharge risk:  Physical Impairment Barriers to Discharge:  Continued Medical Work up  [employer], [place]

## 2017-08-06 NOTE — Clinical Social Work Note (Signed)
Clinical Social Worker facilitated patient discharge including contacting patient family and facility to confirm patient discharge plans.  Clinical information faxed to facility and family agreeable with plan.  CSW arranged ambulance transport via PT[facility] to [facility] [place].  RN to call report prior to discharge.  Clinical Social Worker will sign off for now as social work intervention is no longer needed. Please consult us again if new need arises.  Jesse Tushar Enns, LCSW [phone] 

## 2017-08-06 NOTE — Progress Notes (Signed)
Physical Therapy Treatment Patient Details Name: [name] MRN: [id number] DOB: 10-Oct-1928 Today's Date: 08/06/2017    History of Present Illness 83 year old female, ground level fall at least twice with resultant Small left frontal intracerebral hemorrhage, right proximal humerus fracture, left orbital blowout fracture with maxillary sinus fracture, left zygomatic fracture and possible small nondisplaced pubic ramus fractures which would not alter WB. PHMx: Benign positional vertigo, CVA, and right wrist fx. Treating RUE conservatively with splint and sling    PT Comments    Pt is motivated to work with therapy, however limited by pain and weakness. Pt requires +2 assist to mobilize and steady during gait. Patient would benefit from continued skilled PT to increase activity tolerance and functional independence. Will continue to follow acutely.    Follow Up Recommendations  SNF     Equipment Recommendations  Other (comment) (to be assessed at next venue)    Recommendations for Other Services       Precautions / Restrictions Precautions Precautions: Fall Required Braces or Orthoses: Sling Restrictions Weight Bearing Restrictions: No RUE Weight Bearing: Non weight bearing    Mobility  Bed Mobility Overal bed mobility: Needs Assistance Bed Mobility: Supine to Sit     Supine to sit: Mod assist;+2 for physical assistance     General bed mobility comments: Pt able to elevate trunk into sitting with 1 person HHA. Mod A to progress hips forward to EOB  Transfers Overall transfer level: Needs assistance Equipment used: 2 person hand held assist Transfers: Sit to/from Stand Sit to Stand: Mod assist;+2 physical assistance         General transfer comment: Mod A to power up into standing with 1 person HHA and second person assisting at hips for balance.   Ambulation/Gait Ambulation/Gait assistance: Mod assist;+2 physical assistance Ambulation Distance (Feet): 15  Feet Assistive device: 2 person hand held assist Gait Pattern/deviations: Shuffle;Step-to pattern;Trunk flexed;Narrow base of support     General Gait Details: Pt required +2 HHA during ambulation for balance. LOB x1.    Stairs            Wheelchair Mobility    Modified Rankin (Stroke Patients Only)       Balance Overall balance assessment: Needs assistance Sitting-balance support: Single extremity supported;Feet supported Sitting balance-Leahy Scale: Poor   Postural control: Posterior lean;Left lateral lean Standing balance support: Single extremity supported Standing balance-Leahy Scale: Poor                              Cognition Arousal/Alertness: Awake/alert Behavior During Therapy: WFL for tasks assessed/performed Overall Cognitive Status: Within Functional Limits for tasks assessed                                        Exercises      General Comments        Pertinent Vitals/Pain Pain Assessment: Faces Faces Pain Scale: Hurts whole lot Pain Location: RUE Pain Descriptors / Indicators: Grimacing;Guarding;Moaning Pain Intervention(s): Monitored during session;Limited activity within patient's tolerance    Home Living                      Prior Function            PT Goals (current goals can now be found in the care plan section) Acute Rehab PT Goals  Patient Stated Goal: return home PT Goal Formulation: With patient Time For Goal Achievement: 08/18/17 Potential to Achieve Goals: Good Progress towards PT goals: Progressing toward goals    Frequency    Min 3X/week      PT Plan Current plan remains appropriate    Co-evaluation              AM-PAC PT "6 Clicks" Daily Activity  Outcome Measure  Difficulty turning over in bed (including adjusting bedclothes, sheets and blankets)?: Total Difficulty moving from lying on back to sitting on the side of the bed? : Total Difficulty sitting down on  and standing up from a chair with arms (e.g., wheelchair, bedside commode, etc,.)?: Total Help needed moving to and from a bed to chair (including a wheelchair)?: A Lot Help needed walking in hospital room?: A Lot Help needed climbing 3-5 steps with a railing? : Total 6 Click Score: 8    End of Session Equipment Utilized During Treatment: Other (comment) (sling) Activity Tolerance: Patient limited by pain Patient left: in chair;with call bell/phone within reach Nurse Communication: Mobility status PT Visit Diagnosis: Unsteadiness on feet (R26.81);Other abnormalities of gait and mobility (R26.89);Repeated falls (R29.6);Muscle weakness (generalized) (M62.81);Pain Pain - Right/Left: Right Pain - part of body: Shoulder;Arm     Time: 1779-3903 PT Time Calculation (min) (ACUTE ONLY): 15 min  Charges:  $Therapeutic Activity: 8-22 mins                    G Codes:       Benjiman Core, [place] Pager [phone] Acute Rehab    Allena Katz 08/06/2017, 10:20 AM

## 2017-08-06 NOTE — Consult Note (Signed)
           [facility] CM Primary Care Navigator  08/06/2017  [name] 10-16-1928 [id number]   Went to see patient at the bedside to identify possible discharge needs but she was already discharged per RN report.  Patient was discharged to skilled nursing facility([facility]) prior to returning back home.  Primary care provider's office called [facility]) and notified of patient's discharge, need for post skilled nursing facility follow-up and transition of care.   Made aware to refer to [facility] care management if deemed appropriate for services  For questions, please contact:  Dannielle Huh, BSN, RN- [facility] Primary Care Navigator  Telephone: [phone] [place]

## 2017-08-06 NOTE — Progress Notes (Signed)
Orthopedics Progress Note  Subjective: Patient did well with transfers  Objective:  Vitals:   08/06/17 0430 08/06/17 1013  BP: (!) 127/54 (!) 118/47  Pulse: (!) 106 (!) 4  Resp: 15 16  Temp: 98.7 F (37.1 C) 100.2 F (37.9 C)  SpO2: 94% 91%    General: Awake and alert  Musculoskeletal: right arm splinted, rewrapped with 6 inch ACE wrap to cover splint material - did not tighten the wrap at all. Neurovascularly intact  Lab Results  Component Value Date   WBC 10.9 (H) 08/03/2017   HGB 8.9 (L) 08/03/2017   HCT 27.6 (L) 08/03/2017   MCV 87.1 08/03/2017   PLT 226 08/03/2017       Component Value Date/Time   NA 134 (L) 08/03/2017 0251   K 4.0 08/03/2017 0251   CL 105 08/03/2017 0251   CO2 20 (L) 08/03/2017 0251   GLUCOSE 89 08/03/2017 0251   BUN 12 08/03/2017 0251   CREATININE 0.74 08/03/2017 0251   CALCIUM 8.3 (L) 08/03/2017 0251   GFRNONAA >60 08/03/2017 0251   GFRAA >60 08/03/2017 0251    Lab Results  Component Value Date   INR 1.02 09/16/2016   INR 1.5 10/18/2007   INR 1.2 10/15/2007    Assessment/Plan: S/P ground level fall with CHI, facial fractures and right proximal humerus fracture. Patient transferring to [facility] today for extended rehab. Continue supportive care and NWB R UE F/U with me in 10-14 days in the office  Remo Lipps R. Veverly Fells, MD 08/06/2017 10:22 AM

## 2017-08-06 NOTE — Discharge Instructions (Signed)
Cast or Splint Care, Adult Casts and splints are supports that are worn to protect broken bones and other injuries. A cast or splint may hold a bone still and in the correct position while it heals. Casts and splints may also help to ease pain, swelling, and muscle spasms. How to care for your cast  Do not stick anything inside the cast to scratch your skin.  Check the skin around the cast every day. Tell your doctor about any concerns.  You may put lotion on dry skin around the edges of the cast. Do not put lotion on the skin under the cast.  Keep the cast clean.  If the cast is not waterproof: ? Do not let it get wet. ? Cover it with a watertight covering when you take a bath or a shower. How to care for your splint  Wear it as told by your doctor. Take it off only as told by your doctor.  Loosen the splint if your fingers or toes tingle, get numb, or turn cold and blue.  Keep the splint clean.  If the splint is not waterproof: ? Do not let it get wet. ? Cover it with a watertight covering when you take a bath or a shower. Follow these instructions at home: Bathing  Do not take baths or swim until your doctor says it is okay. Ask your doctor if you can take showers. You may only be allowed to take sponge baths for bathing.  If your cast or splint is not waterproof, cover it with a watertight covering when you take a bath or shower. Managing pain, stiffness, and swelling  Move your fingers or toes often to avoid stiffness and to lessen swelling.  Raise (elevate) the injured area above the level of your heart while sitting or lying down. Safety  Do not use the injured limb to support your body weight until your doctor says that it is okay.  Use crutches or other assistive devices as told by your doctor. General instructions  Do not put pressure on any part of the cast or splint until it is fully hardened. This may take many hours.  Return to your normal activities as  told by your doctor. Ask your doctor what activities are safe for you.  Keep all follow-up visits as told by your doctor. This is important. Contact a doctor if:  Your cast or splint gets damaged.  The skin around the cast gets red or raw.  The skin under the cast is very itchy or painful.  Your cast or splint feels very uncomfortable.  Your cast or splint is too tight or too loose.  Your cast becomes wet or it starts to have a soft spot or area.  You get an object stuck under your cast. Get help right away if:  Your pain gets worse.  The injured area tingles, gets numb, or turns blue and cold.  The part of your body above or below the cast is swollen and it turns a different color (is discolored).  You cannot feel or move your fingers or toes.  There is fluid leaking through the cast.  You have very bad pain or pressure under the cast.  You have trouble breathing.  You have shortness of breath.  You have chest pain. This information is not intended to replace advice given to you by your health care provider. Make sure you discuss any questions you have with your health care provider. Document Released: 04/15/2011 Document   Revised: 12/04/2016 Document Reviewed: 12/04/2016 Elsevier Interactive Patient Education  2017 Elsevier Inc.  

## 2017-08-06 NOTE — Plan of Care (Signed)
Problem: Pain Managment: Goal: General experience of comfort will improve Outcome: Progressing Pt is resting comfortably at this time. VS in WNL. Will offer prn pain meds as neede.  Will continue to reassess pt.

## 2017-08-06 NOTE — Discharge Summary (Signed)
Physician Discharge Summary  Patient ID: [name] MRN: [id number] DOB/AGE: 1928-08-22 83 y.o.  Admit date: 08/02/2017 Discharge date: 08/06/2017  Discharge Diagnoses Fall from ground level Small left frontal contusion  Right proximal humerus fracture Left orbital blowout fracture with maxillary sinus fracture Left zygomatic fracture Laceration to left middle finger  Consultants Orthopedic Surgery - Dr. Netta Cedars Neurosurgery - Dr. Earnie Larsson  ENT - Dr. Jerrell Belfast  Procedures Simple laceration repair - Elmyra Ricks Pisciotta PA-C 08/02/17  HPI: 83 year old female, ground level fall at least twice, once in church 08/01/17 morning after which she was fine and participated in after church activities.  She lives alone and after she went home she talked to her daughter at about 8:00 PM and she was fine. 08/02/17 at about 9:30AM, Her daughter called again and the patient stated that she may need an ambulance.  When she arrive the daughter found her to be on the floor sitting, with a bruised and deformed right upper arm and left facial bruising. Workup in the ED revealed above listed injuries. Patient also had a laceration on her left middle finger which was repaired in the ED with absorbable sutures.   Hospital Course: Patient was admitted to the trauma service and consults called to neurosurgery, ENT and orthopedic surgery. Orthopedic surgery recommended coaptation splint and NWB in the right upper extremity. Repeat films of right shoulder showed stable alignment. Neurosurgery recommended no further imaging or intervention for small contusion and signed off at that time. ENT recommended no surgical intervention and outpatient follow up in several weeks for facial fractures. PT/OT evaluated the patient and recommended SNF placement.   On 08/06/17 patient was tolerating diet, voiding appropriately, vital signs stable, pain appropriately controlled. She is discharged in stable condition to  SNF. She will follow up with orthopedic surgery and ENT.   Physical Exam: Gen:  Alert, NAD, pleasant Eyes: Pupils equal and round. Significant ecchymosis around left eye. Sclera normal bilaterally.  Card:  Regular rate and rhythm, pedal pulses 2+ BL, no edema in bilateral LEs Pulm:  Normal effort, clear to auscultation bilaterally Abd: Soft, non-tender, mildly distended, bowel sounds present, no HSM MSK: RUE in ACE wrap and sling. Right hand is moderately edematous with ecchymosis. LUE normal.  Neuro: motor and sensory function intact in bilateral hands. Grip 5/5 bilaterally.  Skin: warm and dry, no rashes  Psych: A&Ox3   I have personally looked this patient up in the [place] Controlled Substance Database and reviewed their medications.  Allergies as of 08/06/2017      Reactions   Amoxicillin Diarrhea, Nausea And Vomiting, Other (See Comments)   c-diff from antibiotics 2009   Ciprofloxacin Diarrhea, Nausea And Vomiting, Other (See Comments)   c-diff from antibiotics 2009   Clindamycin/lincomycin Diarrhea, Nausea And Vomiting, Other (See Comments)   c-diff from antiobiotics 2009   Codeine Nausea And Vomiting   Penicillins Hives, Swelling, Rash, Other (See Comments)   Has patient had a PCN reaction causing immediate rash, facial/tongue/throat swelling, SOB or lightheadedness with hypotension: swelling of arm, and rash Has patient had a PCN reaction causing severe rash involving mucus membranes or skin necrosis: unknown Has patient had a PCN reaction that required hospitalization : unknown Has patient had a PCN reaction occurring within the last 10 years: no If all of the above answers are "NO", then may proceed with Cephalosporin use.   Tetanus Toxoids Hives   Reaction many years ago   Atorvastatin Diarrhea   Ultracet [  tramadol-acetaminophen] Nausea And Vomiting      Medication List    STOP taking these medications   ibuprofen 200 MG tablet Commonly known as:  ADVIL,MOTRIN      TAKE these medications   acetaminophen 325 MG tablet Commonly known as:  TYLENOL Take 2 tablets (650 mg total) by mouth every 6 (six) hours as needed for mild pain or moderate pain.   ALIGN 4 MG Caps Take 4 mg by mouth daily.   aspirin 325 MG tablet Take 325 mg by mouth daily.   atorvastatin 10 MG tablet Commonly known as:  LIPITOR Take 1 tablet (10 mg total) by mouth daily at 6 PM.   calcium carbonate 750 MG chewable tablet Commonly known as:  TUMS EX Chew 2 tablets by mouth daily.   dextroamphetamine 5 MG tablet Commonly known as:  DEXTROSTAT Take 5 mg by mouth daily.   docusate sodium 100 MG capsule Commonly known as:  COLACE Take 1 capsule (100 mg total) by mouth 2 (two) times daily.   fluticasone 50 MCG/ACT nasal spray Commonly known as:  FLONASE Place 2 sprays into both nostrils daily.   HAIR/SKIN/NAILS/BIOTIN Tabs Take 1 tablet by mouth daily.   levocetirizine 5 MG tablet Commonly known as:  XYZAL Take 5 mg by mouth daily.   lisinopril 10 MG tablet Commonly known as:  PRINIVIL,ZESTRIL Take 10 mg by mouth daily.   Lutein 6 MG Caps Take 6 mg by mouth daily.   Magnesium 300 MG Caps Take 300 mg by mouth daily.   meclizine 25 MG tablet Commonly known as:  ANTIVERT Take 25 mg by mouth 2 (two) times daily as needed for dizziness.   METAMUCIL FIBER PO Take 1 scoop by mouth daily as needed (constipation).   montelukast 10 MG tablet Commonly known as:  SINGULAIR Take 10 mg by mouth daily.   mupirocin ointment 2 % Commonly known as:  BACTROBAN Apply 1 application topically 3 (three) times daily as needed (wound care/ itching).   oxyCODONE 5 MG immediate release tablet Commonly known as:  Oxy IR/ROXICODONE Take 1-2 tablets (5-10 mg total) by mouth every 4 (four) hours as needed for severe pain (May use for moderate breakthrough pain).   simvastatin 10 MG tablet Commonly known as:  ZOCOR Take 10 mg by mouth every evening.   SYSTANE OP Place 1 drop  into both eyes 3 (three) times daily.   triamcinolone cream 0.5 % Commonly known as:  KENALOG Apply 1 application topically 2 (two) times daily as needed (rash/ eczema).   vitamin B-12 1000 MCG tablet Commonly known as:  CYANOCOBALAMIN Take 1,000 mcg by mouth daily.   Vitamin D-3 5000 units Tabs Take 5,000 Units by mouth daily.        Follow-up Information    Netta Cedars, MD. Schedule an appointment as soon as possible for a visit in 10 day(s).   Specialty:  Orthopedic Surgery Why:  Call when you leave the hospital and make an appointment to be seen in 10 days Contact information: [place] [address] [phone]        Jerrell Belfast, MD. Call.   Specialty:  Otolaryngology Why:  Call and make an appointment to follow up.  Contact information: [address] [place] [address] [phone]        [place]. Call.   Why:  Call with questions or concerns, you do not need to follow up in the office.  Contact information: [place] [address] [phone]          Signed: Brigid Re , [facility] Surgery 08/06/2017, 11:23 AM Pager: [phone] Trauma: [phone] Mon-Fri 7:00 am-4:30 pm Sat-Sun 7:00 am-11:30 am

## 2017-08-06 NOTE — Care Management Important Message (Signed)
Important Message  Patient Details  Name: [name] MRN: [id number] Date of Birth: 03/17/1928   Medicare Important Message Given:  Yes    Autumn Gunn 08/06/2017, 1:49 PM

## 2017-08-08 DIAGNOSIS — I10 Essential (primary) hypertension: Secondary | ICD-10-CM | POA: Diagnosis not present

## 2017-08-08 DIAGNOSIS — S0232XA Fracture of orbital floor, left side, initial encounter for closed fracture: Secondary | ICD-10-CM | POA: Diagnosis not present

## 2017-08-08 DIAGNOSIS — S42301A Unspecified fracture of shaft of humerus, right arm, initial encounter for closed fracture: Secondary | ICD-10-CM | POA: Diagnosis not present

## 2017-08-08 DIAGNOSIS — S0083XA Contusion of other part of head, initial encounter: Secondary | ICD-10-CM | POA: Diagnosis not present

## 2017-08-18 DIAGNOSIS — S42351D Displaced comminuted fracture of shaft of humerus, right arm, subsequent encounter for fracture with routine healing: Secondary | ICD-10-CM | POA: Diagnosis not present

## 2017-08-24 DIAGNOSIS — S02402D Zygomatic fracture, unspecified, subsequent encounter for fracture with routine healing: Secondary | ICD-10-CM | POA: Diagnosis not present

## 2017-08-29 DIAGNOSIS — M25552 Pain in left hip: Secondary | ICD-10-CM | POA: Diagnosis not present

## 2017-09-01 DIAGNOSIS — S42351D Displaced comminuted fracture of shaft of humerus, right arm, subsequent encounter for fracture with routine healing: Secondary | ICD-10-CM | POA: Diagnosis not present

## 2017-09-10 DIAGNOSIS — R070 Pain in throat: Secondary | ICD-10-CM | POA: Diagnosis not present

## 2017-09-23 DIAGNOSIS — H04123 Dry eye syndrome of bilateral lacrimal glands: Secondary | ICD-10-CM | POA: Diagnosis not present

## 2017-09-23 DIAGNOSIS — M810 Age-related osteoporosis without current pathological fracture: Secondary | ICD-10-CM | POA: Diagnosis not present

## 2017-09-23 DIAGNOSIS — F329 Major depressive disorder, single episode, unspecified: Secondary | ICD-10-CM | POA: Diagnosis not present

## 2017-09-23 DIAGNOSIS — M1991 Primary osteoarthritis, unspecified site: Secondary | ICD-10-CM | POA: Diagnosis not present

## 2017-09-23 DIAGNOSIS — E785 Hyperlipidemia, unspecified: Secondary | ICD-10-CM | POA: Diagnosis not present

## 2017-09-23 DIAGNOSIS — H8110 Benign paroxysmal vertigo, unspecified ear: Secondary | ICD-10-CM | POA: Diagnosis not present

## 2017-09-23 DIAGNOSIS — S0240FD Zygomatic fracture, left side, subsequent encounter for fracture with routine healing: Secondary | ICD-10-CM | POA: Diagnosis not present

## 2017-09-23 DIAGNOSIS — I10 Essential (primary) hypertension: Secondary | ICD-10-CM | POA: Diagnosis not present

## 2017-09-23 DIAGNOSIS — I868 Varicose veins of other specified sites: Secondary | ICD-10-CM | POA: Diagnosis not present

## 2017-09-23 DIAGNOSIS — S42201D Unspecified fracture of upper end of right humerus, subsequent encounter for fracture with routine healing: Secondary | ICD-10-CM | POA: Diagnosis not present

## 2017-09-23 DIAGNOSIS — E559 Vitamin D deficiency, unspecified: Secondary | ICD-10-CM | POA: Diagnosis not present

## 2017-09-23 DIAGNOSIS — E538 Deficiency of other specified B group vitamins: Secondary | ICD-10-CM | POA: Diagnosis not present

## 2017-09-24 DIAGNOSIS — S42201D Unspecified fracture of upper end of right humerus, subsequent encounter for fracture with routine healing: Secondary | ICD-10-CM | POA: Diagnosis not present

## 2017-09-24 DIAGNOSIS — S0240FD Zygomatic fracture, left side, subsequent encounter for fracture with routine healing: Secondary | ICD-10-CM | POA: Diagnosis not present

## 2017-09-27 DIAGNOSIS — S42201D Unspecified fracture of upper end of right humerus, subsequent encounter for fracture with routine healing: Secondary | ICD-10-CM | POA: Diagnosis not present

## 2017-09-27 DIAGNOSIS — S0240FD Zygomatic fracture, left side, subsequent encounter for fracture with routine healing: Secondary | ICD-10-CM | POA: Diagnosis not present

## 2017-09-28 DIAGNOSIS — S0240FD Zygomatic fracture, left side, subsequent encounter for fracture with routine healing: Secondary | ICD-10-CM | POA: Diagnosis not present

## 2017-09-28 DIAGNOSIS — S42201D Unspecified fracture of upper end of right humerus, subsequent encounter for fracture with routine healing: Secondary | ICD-10-CM | POA: Diagnosis not present

## 2017-09-29 DIAGNOSIS — S42351D Displaced comminuted fracture of shaft of humerus, right arm, subsequent encounter for fracture with routine healing: Secondary | ICD-10-CM | POA: Diagnosis not present

## 2017-09-30 DIAGNOSIS — S0240FD Zygomatic fracture, left side, subsequent encounter for fracture with routine healing: Secondary | ICD-10-CM | POA: Diagnosis not present

## 2017-09-30 DIAGNOSIS — S42201D Unspecified fracture of upper end of right humerus, subsequent encounter for fracture with routine healing: Secondary | ICD-10-CM | POA: Diagnosis not present

## 2017-10-04 DIAGNOSIS — S0240FD Zygomatic fracture, left side, subsequent encounter for fracture with routine healing: Secondary | ICD-10-CM | POA: Diagnosis not present

## 2017-10-04 DIAGNOSIS — S42201D Unspecified fracture of upper end of right humerus, subsequent encounter for fracture with routine healing: Secondary | ICD-10-CM | POA: Diagnosis not present

## 2017-10-05 DIAGNOSIS — R200 Anesthesia of skin: Secondary | ICD-10-CM | POA: Diagnosis not present

## 2017-10-05 DIAGNOSIS — Z23 Encounter for immunization: Secondary | ICD-10-CM | POA: Diagnosis not present

## 2017-10-05 DIAGNOSIS — Z79899 Other long term (current) drug therapy: Secondary | ICD-10-CM | POA: Diagnosis not present

## 2017-10-05 DIAGNOSIS — S0240FD Zygomatic fracture, left side, subsequent encounter for fracture with routine healing: Secondary | ICD-10-CM | POA: Diagnosis not present

## 2017-10-05 DIAGNOSIS — S42201D Unspecified fracture of upper end of right humerus, subsequent encounter for fracture with routine healing: Secondary | ICD-10-CM | POA: Diagnosis not present

## 2017-10-05 DIAGNOSIS — I10 Essential (primary) hypertension: Secondary | ICD-10-CM | POA: Diagnosis not present

## 2017-10-06 DIAGNOSIS — S42201D Unspecified fracture of upper end of right humerus, subsequent encounter for fracture with routine healing: Secondary | ICD-10-CM | POA: Diagnosis not present

## 2017-10-06 DIAGNOSIS — S0240FD Zygomatic fracture, left side, subsequent encounter for fracture with routine healing: Secondary | ICD-10-CM | POA: Diagnosis not present

## 2017-10-08 DIAGNOSIS — S42201D Unspecified fracture of upper end of right humerus, subsequent encounter for fracture with routine healing: Secondary | ICD-10-CM | POA: Diagnosis not present

## 2017-10-08 DIAGNOSIS — S0240FD Zygomatic fracture, left side, subsequent encounter for fracture with routine healing: Secondary | ICD-10-CM | POA: Diagnosis not present

## 2017-10-11 DIAGNOSIS — S42201D Unspecified fracture of upper end of right humerus, subsequent encounter for fracture with routine healing: Secondary | ICD-10-CM | POA: Diagnosis not present

## 2017-10-11 DIAGNOSIS — S0240FD Zygomatic fracture, left side, subsequent encounter for fracture with routine healing: Secondary | ICD-10-CM | POA: Diagnosis not present

## 2017-10-13 DIAGNOSIS — S42201D Unspecified fracture of upper end of right humerus, subsequent encounter for fracture with routine healing: Secondary | ICD-10-CM | POA: Diagnosis not present

## 2017-10-13 DIAGNOSIS — S0240FD Zygomatic fracture, left side, subsequent encounter for fracture with routine healing: Secondary | ICD-10-CM | POA: Diagnosis not present

## 2017-10-14 DIAGNOSIS — S0240FD Zygomatic fracture, left side, subsequent encounter for fracture with routine healing: Secondary | ICD-10-CM | POA: Diagnosis not present

## 2017-10-14 DIAGNOSIS — S42201D Unspecified fracture of upper end of right humerus, subsequent encounter for fracture with routine healing: Secondary | ICD-10-CM | POA: Diagnosis not present

## 2017-10-18 DIAGNOSIS — S42201D Unspecified fracture of upper end of right humerus, subsequent encounter for fracture with routine healing: Secondary | ICD-10-CM | POA: Diagnosis not present

## 2017-10-18 DIAGNOSIS — S0240FD Zygomatic fracture, left side, subsequent encounter for fracture with routine healing: Secondary | ICD-10-CM | POA: Diagnosis not present

## 2017-10-26 DIAGNOSIS — S0240FD Zygomatic fracture, left side, subsequent encounter for fracture with routine healing: Secondary | ICD-10-CM | POA: Diagnosis not present

## 2017-10-26 DIAGNOSIS — S42201D Unspecified fracture of upper end of right humerus, subsequent encounter for fracture with routine healing: Secondary | ICD-10-CM | POA: Diagnosis not present

## 2017-10-27 DIAGNOSIS — S42351D Displaced comminuted fracture of shaft of humerus, right arm, subsequent encounter for fracture with routine healing: Secondary | ICD-10-CM | POA: Diagnosis not present

## 2017-11-03 DIAGNOSIS — S42201D Unspecified fracture of upper end of right humerus, subsequent encounter for fracture with routine healing: Secondary | ICD-10-CM | POA: Diagnosis not present

## 2017-11-03 DIAGNOSIS — S0240FD Zygomatic fracture, left side, subsequent encounter for fracture with routine healing: Secondary | ICD-10-CM | POA: Diagnosis not present

## 2017-11-10 DIAGNOSIS — S42201D Unspecified fracture of upper end of right humerus, subsequent encounter for fracture with routine healing: Secondary | ICD-10-CM | POA: Diagnosis not present

## 2017-11-10 DIAGNOSIS — S0240FD Zygomatic fracture, left side, subsequent encounter for fracture with routine healing: Secondary | ICD-10-CM | POA: Diagnosis not present

## 2017-11-15 DIAGNOSIS — E7800 Pure hypercholesterolemia, unspecified: Secondary | ICD-10-CM | POA: Diagnosis not present

## 2017-11-15 DIAGNOSIS — M792 Neuralgia and neuritis, unspecified: Secondary | ICD-10-CM | POA: Diagnosis not present

## 2017-11-15 DIAGNOSIS — I10 Essential (primary) hypertension: Secondary | ICD-10-CM | POA: Diagnosis not present

## 2017-11-17 DIAGNOSIS — S42201D Unspecified fracture of upper end of right humerus, subsequent encounter for fracture with routine healing: Secondary | ICD-10-CM | POA: Diagnosis not present

## 2017-11-17 DIAGNOSIS — S0240FD Zygomatic fracture, left side, subsequent encounter for fracture with routine healing: Secondary | ICD-10-CM | POA: Diagnosis not present

## 2017-11-22 DIAGNOSIS — S42201D Unspecified fracture of upper end of right humerus, subsequent encounter for fracture with routine healing: Secondary | ICD-10-CM | POA: Diagnosis not present

## 2017-11-22 DIAGNOSIS — E785 Hyperlipidemia, unspecified: Secondary | ICD-10-CM | POA: Diagnosis not present

## 2017-11-22 DIAGNOSIS — I868 Varicose veins of other specified sites: Secondary | ICD-10-CM | POA: Diagnosis not present

## 2017-11-22 DIAGNOSIS — Z9181 History of falling: Secondary | ICD-10-CM | POA: Diagnosis not present

## 2017-11-22 DIAGNOSIS — M810 Age-related osteoporosis without current pathological fracture: Secondary | ICD-10-CM | POA: Diagnosis not present

## 2017-11-22 DIAGNOSIS — H8110 Benign paroxysmal vertigo, unspecified ear: Secondary | ICD-10-CM | POA: Diagnosis not present

## 2017-11-22 DIAGNOSIS — M1991 Primary osteoarthritis, unspecified site: Secondary | ICD-10-CM | POA: Diagnosis not present

## 2017-11-22 DIAGNOSIS — E538 Deficiency of other specified B group vitamins: Secondary | ICD-10-CM | POA: Diagnosis not present

## 2017-11-22 DIAGNOSIS — H04123 Dry eye syndrome of bilateral lacrimal glands: Secondary | ICD-10-CM | POA: Diagnosis not present

## 2017-11-22 DIAGNOSIS — I10 Essential (primary) hypertension: Secondary | ICD-10-CM | POA: Diagnosis not present

## 2017-11-22 DIAGNOSIS — F329 Major depressive disorder, single episode, unspecified: Secondary | ICD-10-CM | POA: Diagnosis not present

## 2017-11-22 DIAGNOSIS — E559 Vitamin D deficiency, unspecified: Secondary | ICD-10-CM | POA: Diagnosis not present

## 2017-11-24 DIAGNOSIS — S42201D Unspecified fracture of upper end of right humerus, subsequent encounter for fracture with routine healing: Secondary | ICD-10-CM | POA: Diagnosis not present

## 2017-11-24 DIAGNOSIS — H8110 Benign paroxysmal vertigo, unspecified ear: Secondary | ICD-10-CM | POA: Diagnosis not present

## 2017-11-24 DIAGNOSIS — S42351D Displaced comminuted fracture of shaft of humerus, right arm, subsequent encounter for fracture with routine healing: Secondary | ICD-10-CM | POA: Diagnosis not present

## 2017-11-26 DIAGNOSIS — S42201D Unspecified fracture of upper end of right humerus, subsequent encounter for fracture with routine healing: Secondary | ICD-10-CM | POA: Diagnosis not present

## 2017-11-26 DIAGNOSIS — H8110 Benign paroxysmal vertigo, unspecified ear: Secondary | ICD-10-CM | POA: Diagnosis not present

## 2017-11-30 DIAGNOSIS — S42201D Unspecified fracture of upper end of right humerus, subsequent encounter for fracture with routine healing: Secondary | ICD-10-CM | POA: Diagnosis not present

## 2017-11-30 DIAGNOSIS — H8110 Benign paroxysmal vertigo, unspecified ear: Secondary | ICD-10-CM | POA: Diagnosis not present

## 2017-12-02 DIAGNOSIS — S42201D Unspecified fracture of upper end of right humerus, subsequent encounter for fracture with routine healing: Secondary | ICD-10-CM | POA: Diagnosis not present

## 2017-12-02 DIAGNOSIS — H8110 Benign paroxysmal vertigo, unspecified ear: Secondary | ICD-10-CM | POA: Diagnosis not present

## 2017-12-07 DIAGNOSIS — S42201D Unspecified fracture of upper end of right humerus, subsequent encounter for fracture with routine healing: Secondary | ICD-10-CM | POA: Diagnosis not present

## 2017-12-07 DIAGNOSIS — H8110 Benign paroxysmal vertigo, unspecified ear: Secondary | ICD-10-CM | POA: Diagnosis not present

## 2017-12-09 DIAGNOSIS — S42201D Unspecified fracture of upper end of right humerus, subsequent encounter for fracture with routine healing: Secondary | ICD-10-CM | POA: Diagnosis not present

## 2017-12-09 DIAGNOSIS — H8110 Benign paroxysmal vertigo, unspecified ear: Secondary | ICD-10-CM | POA: Diagnosis not present

## 2017-12-15 DIAGNOSIS — S42201D Unspecified fracture of upper end of right humerus, subsequent encounter for fracture with routine healing: Secondary | ICD-10-CM | POA: Diagnosis not present

## 2017-12-15 DIAGNOSIS — H8110 Benign paroxysmal vertigo, unspecified ear: Secondary | ICD-10-CM | POA: Diagnosis not present

## 2017-12-23 DIAGNOSIS — S42201D Unspecified fracture of upper end of right humerus, subsequent encounter for fracture with routine healing: Secondary | ICD-10-CM | POA: Diagnosis not present

## 2017-12-23 DIAGNOSIS — H8110 Benign paroxysmal vertigo, unspecified ear: Secondary | ICD-10-CM | POA: Diagnosis not present

## 2017-12-31 DIAGNOSIS — H8110 Benign paroxysmal vertigo, unspecified ear: Secondary | ICD-10-CM | POA: Diagnosis not present

## 2017-12-31 DIAGNOSIS — S42201D Unspecified fracture of upper end of right humerus, subsequent encounter for fracture with routine healing: Secondary | ICD-10-CM | POA: Diagnosis not present

## 2018-01-18 DIAGNOSIS — S42391D Other fracture of shaft of right humerus, subsequent encounter for fracture with routine healing: Secondary | ICD-10-CM | POA: Diagnosis not present

## 2018-01-18 DIAGNOSIS — S42301D Unspecified fracture of shaft of humerus, right arm, subsequent encounter for fracture with routine healing: Secondary | ICD-10-CM | POA: Diagnosis not present

## 2018-02-15 DIAGNOSIS — I10 Essential (primary) hypertension: Secondary | ICD-10-CM | POA: Diagnosis not present

## 2018-02-15 DIAGNOSIS — J069 Acute upper respiratory infection, unspecified: Secondary | ICD-10-CM | POA: Diagnosis not present

## 2018-02-15 DIAGNOSIS — Z79899 Other long term (current) drug therapy: Secondary | ICD-10-CM | POA: Diagnosis not present

## 2018-02-15 DIAGNOSIS — B9789 Other viral agents as the cause of diseases classified elsewhere: Secondary | ICD-10-CM | POA: Diagnosis not present

## 2018-02-21 DIAGNOSIS — Z79899 Other long term (current) drug therapy: Secondary | ICD-10-CM | POA: Diagnosis not present

## 2018-03-08 DIAGNOSIS — J301 Allergic rhinitis due to pollen: Secondary | ICD-10-CM | POA: Diagnosis not present

## 2018-03-08 DIAGNOSIS — I10 Essential (primary) hypertension: Secondary | ICD-10-CM | POA: Diagnosis not present

## 2018-03-08 DIAGNOSIS — K439 Ventral hernia without obstruction or gangrene: Secondary | ICD-10-CM | POA: Diagnosis not present

## 2018-03-25 DIAGNOSIS — H04123 Dry eye syndrome of bilateral lacrimal glands: Secondary | ICD-10-CM | POA: Diagnosis not present

## 2018-03-25 DIAGNOSIS — H35033 Hypertensive retinopathy, bilateral: Secondary | ICD-10-CM | POA: Diagnosis not present

## 2018-03-25 DIAGNOSIS — H40023 Open angle with borderline findings, high risk, bilateral: Secondary | ICD-10-CM | POA: Diagnosis not present

## 2018-03-25 DIAGNOSIS — H524 Presbyopia: Secondary | ICD-10-CM | POA: Diagnosis not present

## 2018-03-25 DIAGNOSIS — H52223 Regular astigmatism, bilateral: Secondary | ICD-10-CM | POA: Diagnosis not present

## 2018-05-09 DIAGNOSIS — H9313 Tinnitus, bilateral: Secondary | ICD-10-CM | POA: Diagnosis not present

## 2018-05-09 DIAGNOSIS — H903 Sensorineural hearing loss, bilateral: Secondary | ICD-10-CM | POA: Diagnosis not present

## 2018-05-16 DIAGNOSIS — H903 Sensorineural hearing loss, bilateral: Secondary | ICD-10-CM | POA: Diagnosis not present

## 2018-05-16 DIAGNOSIS — H9313 Tinnitus, bilateral: Secondary | ICD-10-CM | POA: Diagnosis not present

## 2018-06-22 DIAGNOSIS — R5383 Other fatigue: Secondary | ICD-10-CM | POA: Diagnosis not present

## 2018-06-22 DIAGNOSIS — N183 Chronic kidney disease, stage 3 (moderate): Secondary | ICD-10-CM | POA: Diagnosis not present

## 2018-06-22 DIAGNOSIS — I129 Hypertensive chronic kidney disease with stage 1 through stage 4 chronic kidney disease, or unspecified chronic kidney disease: Secondary | ICD-10-CM | POA: Diagnosis not present

## 2018-06-22 DIAGNOSIS — M810 Age-related osteoporosis without current pathological fracture: Secondary | ICD-10-CM | POA: Diagnosis not present

## 2018-06-22 DIAGNOSIS — Z1389 Encounter for screening for other disorder: Secondary | ICD-10-CM | POA: Diagnosis not present

## 2018-06-22 DIAGNOSIS — Z0000 Encounter for general adult medical examination without abnormal findings: Secondary | ICD-10-CM | POA: Diagnosis not present

## 2018-06-22 DIAGNOSIS — Z79899 Other long term (current) drug therapy: Secondary | ICD-10-CM | POA: Diagnosis not present

## 2018-06-22 DIAGNOSIS — I10 Essential (primary) hypertension: Secondary | ICD-10-CM | POA: Diagnosis not present

## 2018-08-03 DIAGNOSIS — M810 Age-related osteoporosis without current pathological fracture: Secondary | ICD-10-CM | POA: Diagnosis not present

## 2018-08-04 ENCOUNTER — Other Ambulatory Visit: Payer: Self-pay | Admitting: Geriatric Medicine

## 2018-08-04 ENCOUNTER — Ambulatory Visit
Admission: RE | Admit: 2018-08-04 | Discharge: 2018-08-04 | Disposition: A | Discharge status: Home / Self Care | Payer: Medicare Other | Source: Ambulatory Visit | Attending: Geriatric Medicine | Admitting: Geriatric Medicine

## 2018-08-04 DIAGNOSIS — M533 Sacrococcygeal disorders, not elsewhere classified: Secondary | ICD-10-CM

## 2018-08-04 DIAGNOSIS — N183 Chronic kidney disease, stage 3 (moderate): Secondary | ICD-10-CM | POA: Diagnosis not present

## 2018-08-04 DIAGNOSIS — I129 Hypertensive chronic kidney disease with stage 1 through stage 4 chronic kidney disease, or unspecified chronic kidney disease: Secondary | ICD-10-CM | POA: Diagnosis not present

## 2018-08-04 DIAGNOSIS — S3992XA Unspecified injury of lower back, initial encounter: Secondary | ICD-10-CM | POA: Diagnosis not present

## 2018-08-05 DIAGNOSIS — H35033 Hypertensive retinopathy, bilateral: Secondary | ICD-10-CM | POA: Diagnosis not present

## 2018-08-05 DIAGNOSIS — H04123 Dry eye syndrome of bilateral lacrimal glands: Secondary | ICD-10-CM | POA: Diagnosis not present

## 2018-08-05 DIAGNOSIS — H40023 Open angle with borderline findings, high risk, bilateral: Secondary | ICD-10-CM | POA: Diagnosis not present

## 2018-08-15 DIAGNOSIS — N183 Chronic kidney disease, stage 3 (moderate): Secondary | ICD-10-CM | POA: Diagnosis not present

## 2018-08-15 DIAGNOSIS — I129 Hypertensive chronic kidney disease with stage 1 through stage 4 chronic kidney disease, or unspecified chronic kidney disease: Secondary | ICD-10-CM | POA: Diagnosis not present

## 2018-08-15 DIAGNOSIS — M810 Age-related osteoporosis without current pathological fracture: Secondary | ICD-10-CM | POA: Diagnosis not present

## 2018-08-15 DIAGNOSIS — I872 Venous insufficiency (chronic) (peripheral): Secondary | ICD-10-CM | POA: Diagnosis not present

## 2018-09-04 IMAGING — CT CT CERVICAL SPINE W/O CM
5 of 10 series · 12 of 33 positions shown, 13 images · non-contrast
Comparison: Brain MRI 05/31/2017.  Head CT 09/16/2016.

CLINICAL DATA: 88-year-old female with syncopal episode yesterday
while standing with history of trauma from a fall with injury to the
face and head. Laceration and bruising around a left thigh. Headache
today.

EXAM:
CT HEAD WITHOUT CONTRAST
CT MAXILLOFACIAL WITHOUT CONTRAST
CT CERVICAL SPINE WITHOUT CONTRAST
TECHNIQUE: Multidetector CT imaging of the head, cervical spine, and
maxillofacial structures were performed using the standard protocol
without intravenous contrast. Multiplanar CT image reconstructions
of the cervical spine and maxillofacial structures were also
generated.

[Series 5: head bone · axial · 0.43mm/px · z∈[-132,-86]mm · 2 of 71 slices shown]
[im 24/71  bone]
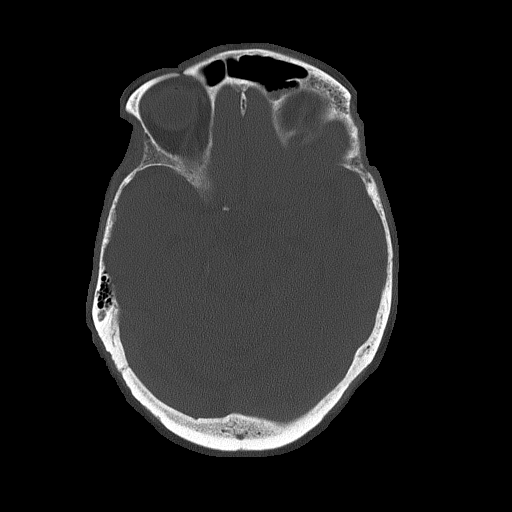
[im 47/71  bone]
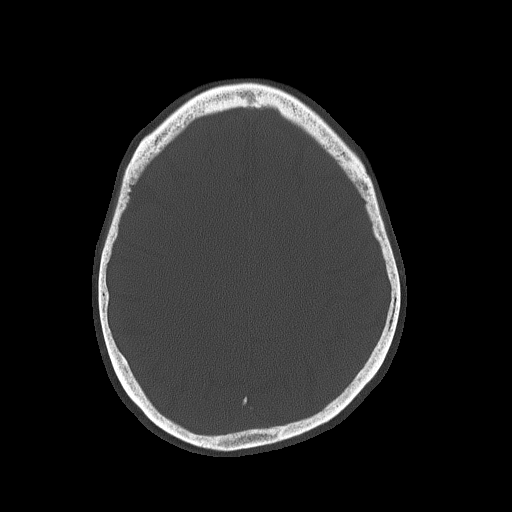

[Series 8: facialbone 2.0 st · axial · 0.33mm/px · z∈[-216,-164]mm · 2 of 79 slices shown]
[im 27/79  bone]
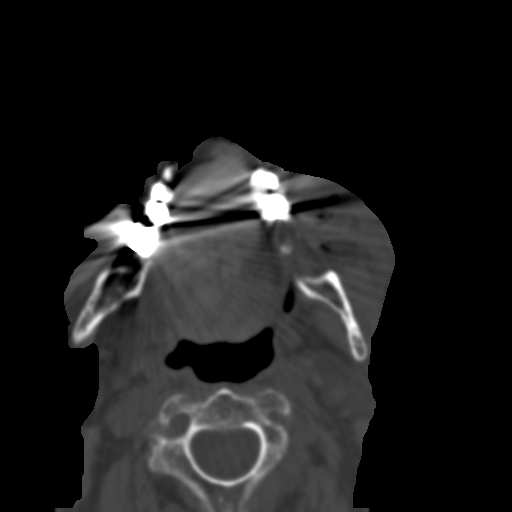
[im 53/79  bone]
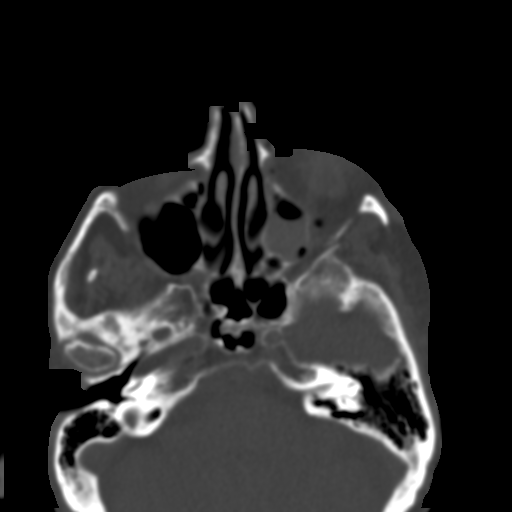

[Series 12: facialbone 2.0 cor st · coronal · 0.32mm/px · 1 of 70 slices shown]
[im 35/70  bone]
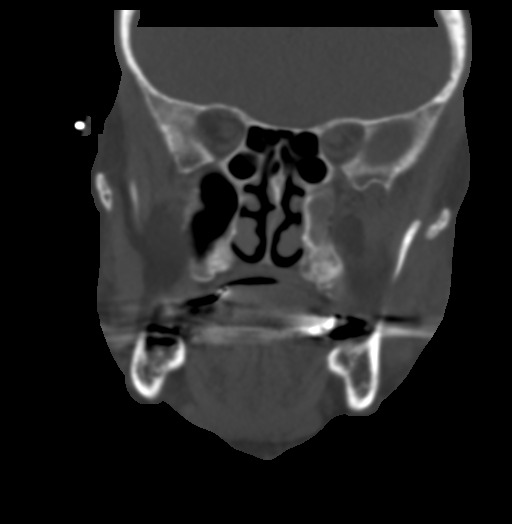

[Series 13: facialbone 2.0 sag st · sagittal · 0.34mm/px · 4 of 69 slices shown]
[im 14/69  bone]
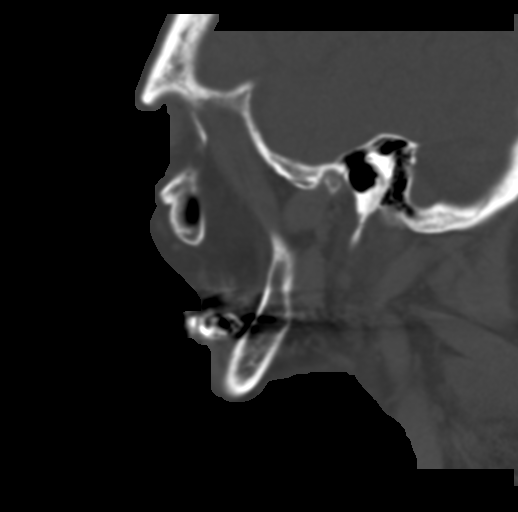
[im 28/69  bone]
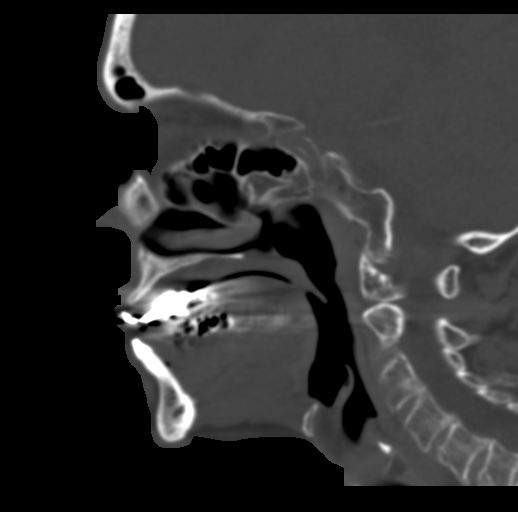
[im 41/69  bone]
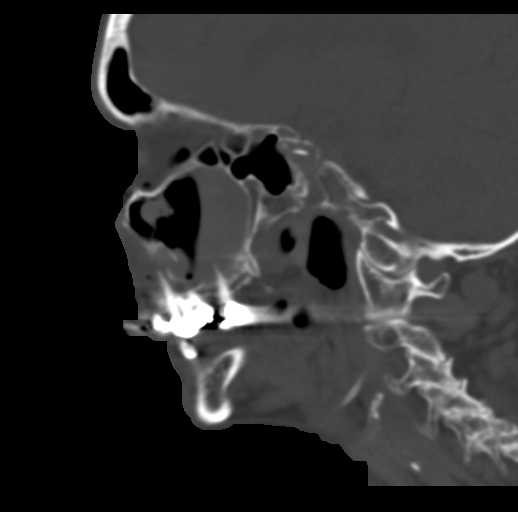
[im 55/69  bone]
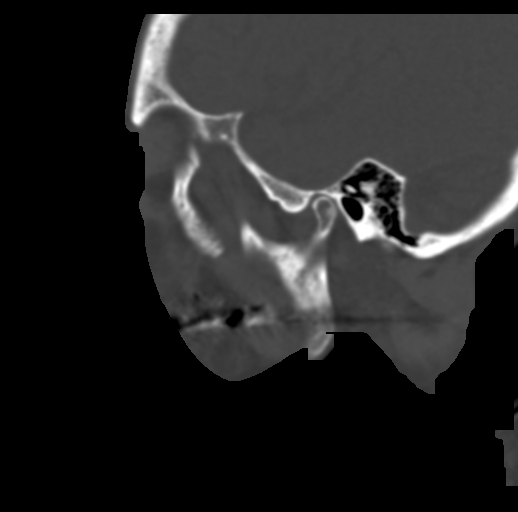

[Series 16: c_spine 2.0 st · axial · 0.35mm/px · z∈[-276,-190]mm · 3 of 87 slices shown, 4 images]
[im 22/87  soft-tissue]
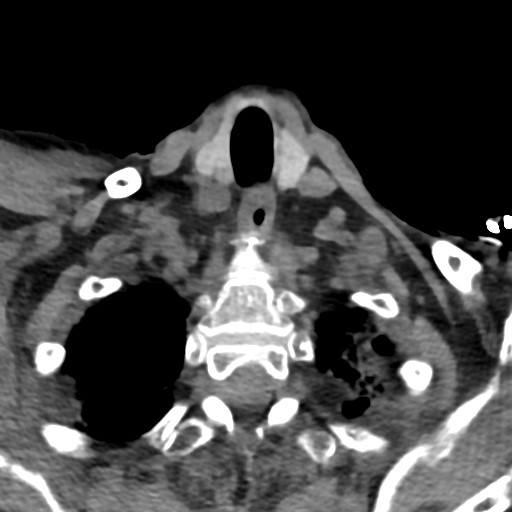
[im 22/87  bone]
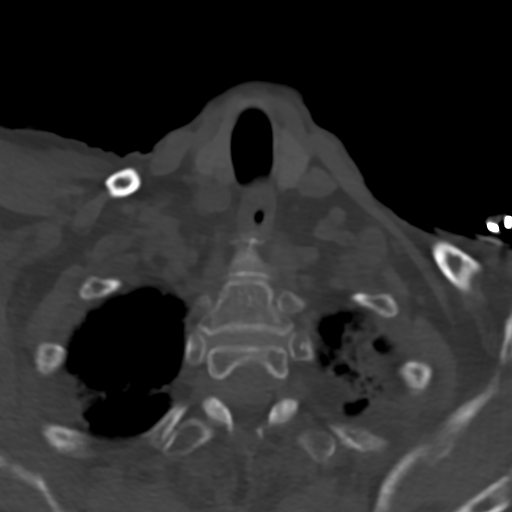
[im 44/87  bone]
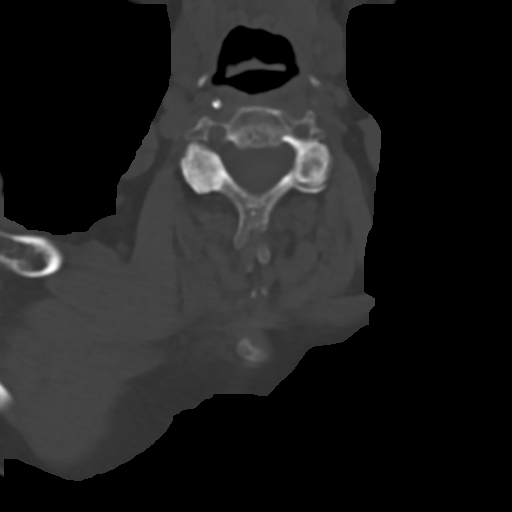
[im 65/87  bone]
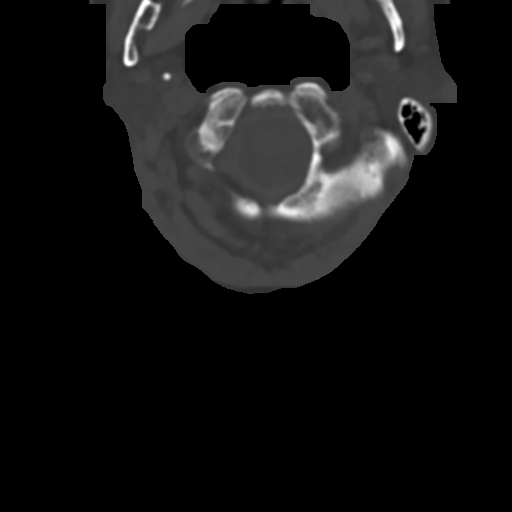

[12 of 33 positions shown; findings below may reference images not displayed]

FINDINGS: CT HEAD FINDINGS

Brain: Small focus of high attenuation measuring 9 x 7 x 6 mm (axial
image 22 of series 4 and coronal image 21 of series 6) in the left
frontal cortex adjacent to the falx cerebri. Patchy and confluent
areas of decreased attenuation are noted throughout the deep and
periventricular white matter of the cerebral hemispheres
bilaterally, compatible with chronic microvascular ischemic disease.
No evidence of acute infarction, hydrocephalus, extra-axial
collection or mass lesion/mass effect.

Vascular: No hyperdense vessel or unexpected calcification.

Skull: Facial bone fractures, as detailed below.

Other: Subcutaneous emphysema in the left side of the face in the
left periorbital region, as discussed below.

CT MAXILLOFACIAL FINDINGS

Osseous: Multiple acute fractures of the left side of the face. This
includes a nondisplaced fracture through the anterior and lateral
aspect of the left orbital floor, and a nondisplaced fracture
through the lateral wall of the left orbit. Multiple fractures of
the left maxillary sinus, including displaced comminuted fractures
of the lateral/posterior wall which invaginate into the sinus,
displaced and comminuted fractures of the anterior wall of the left
maxillary sinus which also invaginated into the sinus, and subtle
minimally displaced fractures of the medial wall of the sinus
immediately inferior to the anterior aspect of the inferior
turbinate. Notably, the fracture of the lateral wall the left
maxillary sinus involves the lateral aspect of the alveolar ridge.
Acute minimally displaced fracture through the posterior aspect of
the left zygomatic arch. Mandible is intact, and temporomandibular
joints are located bilaterally.

Orbits: Nondisplaced fractures of the lateral wall of the left orbit
and anterior/lateral aspect of the orbital floor, as above. There is
gas in the inferior aspect of the left orbit. This appears
predominantly extraconal, however, coronal image 27 of series 10
demonstrates 1 tiny locule of gas which could conceivably be
intraconal. No definite herniation of orbital contents or findings
to suggest entrapment of extraocular muscles.

Sinuses: High attenuation fluid lying dependently in the left
maxillary sinus, compatible with hemosinus. Small amount of
dependent fluid also noted in the left frontal sinus.

Soft tissues: High attenuation soft tissue swelling in the left
periorbital region, compatible with a hematoma. Subcutaneous
emphysema and emphysema in the deeper soft tissue is adjacent to the
left maxilla with some gas in the deep soft tissues of the face
posterior to the left maxillary sinus.

CT CERVICAL SPINE FINDINGS

Alignment: Normal.

Skull base and vertebrae: No acute fracture. No primary bone lesion
or focal pathologic process.

Soft tissues and spinal canal: No prevertebral fluid or swelling. No
visible canal hematoma.

Disc levels: Multilevel degenerative disc disease, most apparent at
C6-C7. Moderate multilevel facet arthropathy.

Upper chest: Areas of mild pleuroparenchymal scarring in the apices
of the thorax bilaterally.

Other: None.
IMPRESSION: 1. Small cortical hemorrhage in the [place] frontal lobe just
lateral to the falx cerebri, as above.
2. Acute facial fractures involving the left maxilla, ethmoid and
temporal bones, including nondisplaced fractures through the lateral
wall and anterior/lateral aspect of the left orbital floor,
minimally displaced fracture through the posterior aspect of the
left zygomatic arch as well as numerous displaced fractures of the
left maxillary sinus, as detailed above. Hematoma, contusion and gas
in the superficial soft tissues.
3. There is gas within the left orbit which is predominantly
extraconal, however, there is one small locule of gas which appears
to be intraconal.
4. No evidence of significant acute traumatic injury to the cervical
spine.
5. Chronic microvascular ischemic changes in the cerebral white
matter, as above.
6. Mild multilevel degenerative disc disease and cervical
spondylosis, as above.
Critical Value/emergent results were called by telephone at the time
of interpretation on 08/02/2017 at [DATE] to Dr. YESI TOLLEFSON, who
verbally acknowledged these results.

## 2018-09-05 IMAGING — DX DG SHOULDER 2+V PORT*R*
1 series · 1 of 1 positions shown · non-contrast
Comparison: Yesterday

CLINICAL DATA: Right proximal humerus fracture.  Initial encounter.

EXAM:
PORTABLE RIGHT SHOULDER

[shoulder ap]
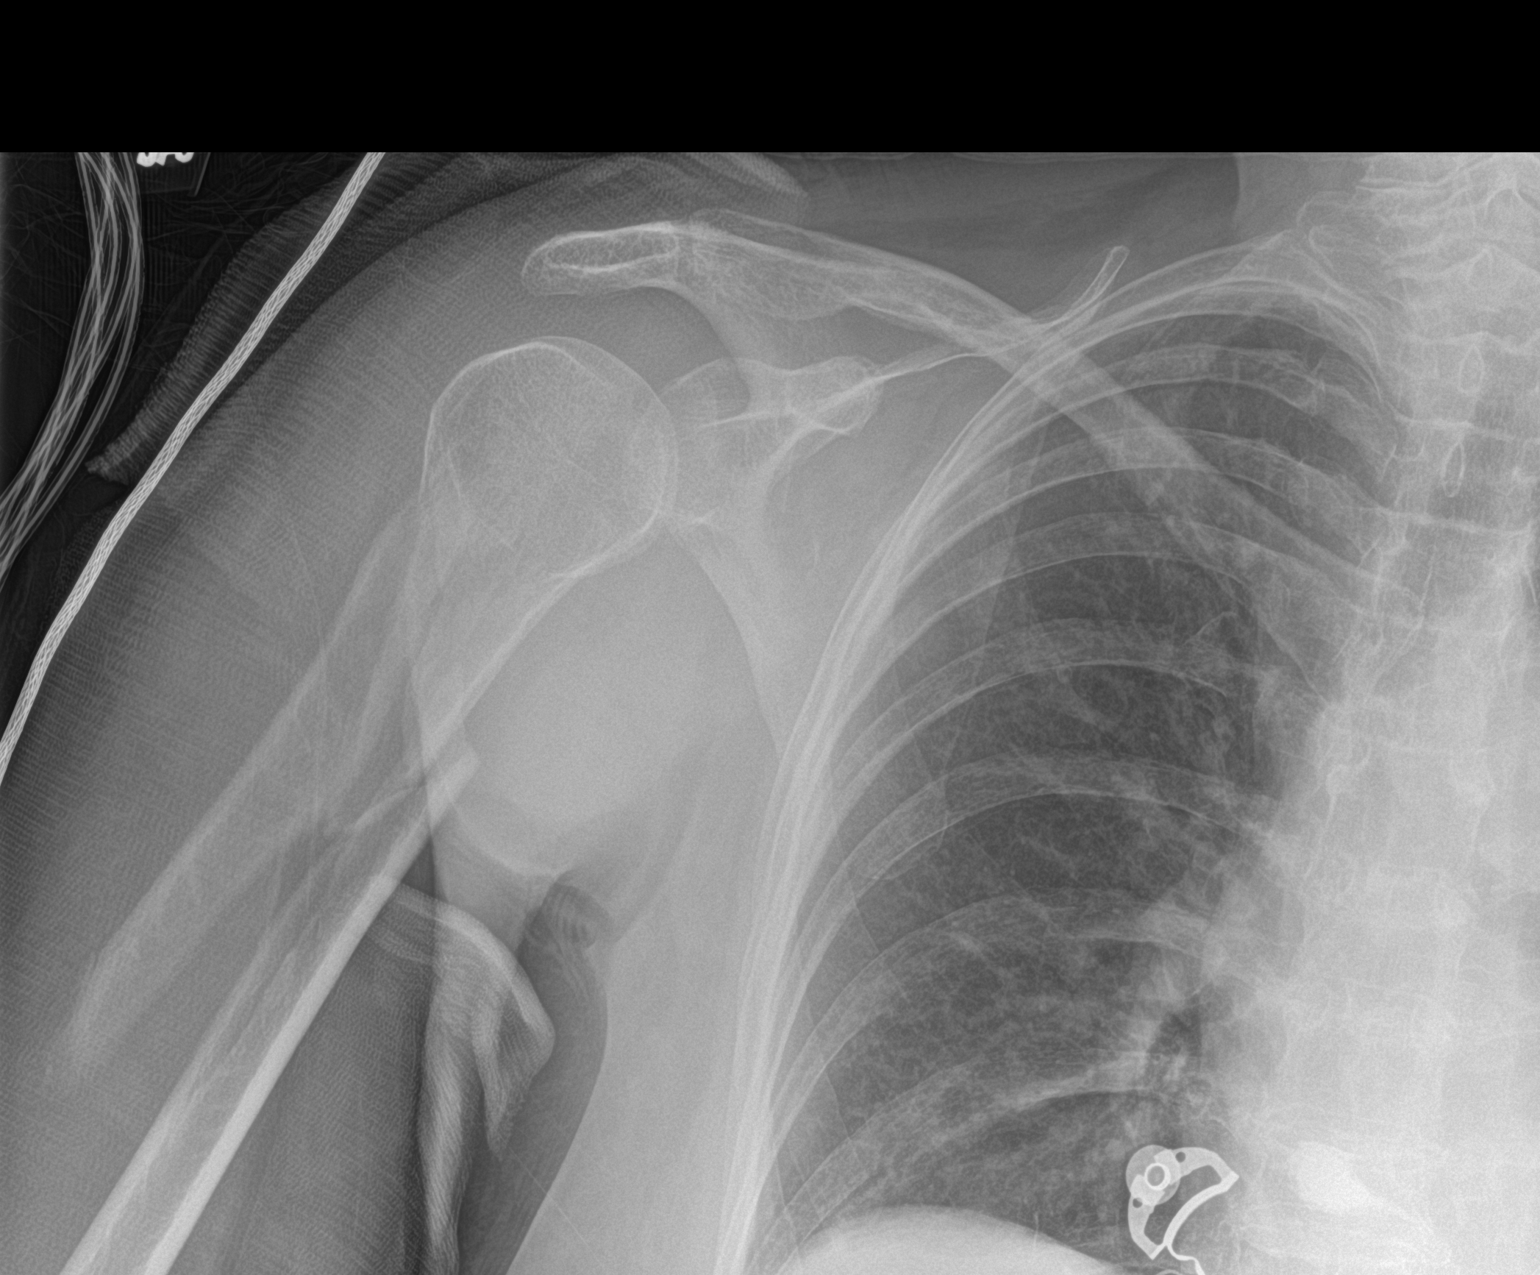

[1 of 1 positions shown; findings below may reference images not displayed]

FINDINGS: Single AP view of the right shoulder shows unchanged comminuted
fracturing of the upper humeral diaphysis with medial displacement.
On prior radiograph, there is also anterior displacement. Located
glenohumeral joint.
IMPRESSION: Unchanged alignment of comminuted proximal humeral diaphysis
fracture in the AP projection.

## 2018-10-22 ENCOUNTER — Emergency Department (HOSPITAL_COMMUNITY): Payer: Medicare Other

## 2018-10-22 ENCOUNTER — Encounter (HOSPITAL_COMMUNITY): Payer: Self-pay

## 2018-10-22 ENCOUNTER — Emergency Department (HOSPITAL_COMMUNITY)
Admission: EM | Admit: 2018-10-22 | Discharge: 2018-10-23 | Disposition: A | Discharge status: Home / Self Care | Payer: Medicare Other | Attending: Emergency Medicine | Admitting: Emergency Medicine

## 2018-10-22 ENCOUNTER — Other Ambulatory Visit: Payer: Self-pay

## 2018-10-22 DIAGNOSIS — S022XXA Fracture of nasal bones, initial encounter for closed fracture: Secondary | ICD-10-CM

## 2018-10-22 DIAGNOSIS — S62317A Displaced fracture of base of fifth metacarpal bone. left hand, initial encounter for closed fracture: Secondary | ICD-10-CM | POA: Diagnosis not present

## 2018-10-22 DIAGNOSIS — Y998 Other external cause status: Secondary | ICD-10-CM | POA: Insufficient documentation

## 2018-10-22 DIAGNOSIS — S0993XA Unspecified injury of face, initial encounter: Secondary | ICD-10-CM | POA: Diagnosis present

## 2018-10-22 DIAGNOSIS — S62347A Nondisplaced fracture of base of fifth metacarpal bone. left hand, initial encounter for closed fracture: Secondary | ICD-10-CM | POA: Diagnosis not present

## 2018-10-22 DIAGNOSIS — Y9289 Other specified places as the place of occurrence of the external cause: Secondary | ICD-10-CM | POA: Diagnosis not present

## 2018-10-22 DIAGNOSIS — W010XXA Fall on same level from slipping, tripping and stumbling without subsequent striking against object, initial encounter: Secondary | ICD-10-CM | POA: Insufficient documentation

## 2018-10-22 DIAGNOSIS — Y9301 Activity, walking, marching and hiking: Secondary | ICD-10-CM | POA: Insufficient documentation

## 2018-10-22 DIAGNOSIS — I10 Essential (primary) hypertension: Secondary | ICD-10-CM | POA: Diagnosis not present

## 2018-10-22 DIAGNOSIS — Z8673 Personal history of transient ischemic attack (TIA), and cerebral infarction without residual deficits: Secondary | ICD-10-CM | POA: Insufficient documentation

## 2018-10-22 DIAGNOSIS — Z79899 Other long term (current) drug therapy: Secondary | ICD-10-CM | POA: Diagnosis not present

## 2018-10-22 DIAGNOSIS — R51 Headache: Secondary | ICD-10-CM | POA: Diagnosis not present

## 2018-10-22 MED ORDER — SULFAMETHOXAZOLE-TRIMETHOPRIM 800-160 MG PO TABS: 1.0000 | ORAL_TABLET | Freq: Two times a day (BID) | ORAL | 0 refills | Status: AC

## 2018-10-22 NOTE — ED Triage Notes (Signed)
She states she fell in her bathroom, striking the bridge-of-nose area, at which she has swelling and ecchymosis. She denies l.o.c. She also denies neck pain. Her only other c/o is of mild pain at left 5th finger, which is normal in appearance.

## 2018-10-22 NOTE — ED Provider Notes (Signed)
[facility] DEPT Provider Note   CSN: [id number] Arrival date & time: 10/22/18  1800     History   Chief Complaint Chief Complaint  Patient presents with  . Facial Injury    HPI [name] is a 84 y.o. female.  The history is provided by the patient and medical records. No language interpreter was used.  Facial Injury   [name] is a 84 y.o. female  with a PMH as listed below who presents to the Emergency Department for evaluation after mechanical fall just prior to arrival.  Patient states that she went to turn and her feet got tripped, causing her to fall.  She fell face forward and hit her face on a tile floor.  She did catch herself with her right hand.  She is complaining of pain to the nose as well as the right hand.  She does have a very small abrasion to the bridge of her nose.  Tetanus is up-to-date.  She did not lose consciousness.  She is not on any anticoagulation.  Denies any nausea or vomiting.  No numbness, weakness.  Past Medical History:  Diagnosis Date  . Benign positional vertigo   . Bursitis started 10-2011   left leg and left hip  . Clostridium difficile diarrhea    hx of 10 years ago  . DJD (degenerative joint disease)   . Family history of adverse reaction to anesthesia    daughter has nausea  . Hypertension   . Leg cramps   . Osteoporosis   . Pneumonia    as a baby  . PONV (postoperative nausea and vomiting)   . Stroke ([place])   . Varicose veins     Patient Active Problem List   Diagnosis Date Noted  . Intracranial hemorrhage ([place]) 08/02/2017  . Hyperlipemia 04/22/2017  . Radius and ulna distal fracture, left, closed, initial encounter 12/24/2016  . Cerebral thrombosis with cerebral infarction 09/17/2016  . Acute CVA (cerebrovascular accident) ([place]) 09/17/2016  . CVA (cerebral infarction) 09/16/2016  . Facial droop-left 09/16/2016  . Slurred speech 09/16/2016  . Hypertension   . Benign  positional vertigo   . Essential hypertension   . Varicose veins of lower extremities with other complications 27/74/1287  . Pain in limb 01/06/2012    Past Surgical History:  Procedure Laterality Date  . ABDOMINAL HYSTERECTOMY    . APPENDECTOMY    . CATARACT EXTRACTION     right  . COLONOSCOPY    . HERNIA REPAIR    . KYPHOSIS SURGERY    . ORIF WRIST FRACTURE Left 12/24/2016   Procedure: OPEN REDUCTION INTERNAL FIXATION (ORIF) LEFT WRIST FRACTURE;  Surgeon: Roseanne Kaufman, MD;  Location: [facility];  Service: Orthopedics;  Laterality: Left;  . TUBAL LIGATION     bilateral     OB History   None      Home Medications    Prior to Admission medications   Medication Sig Start Date End Date Taking? Authorizing Provider  acetaminophen (TYLENOL) 325 MG tablet Take 2 tablets (650 mg total) by mouth every 6 (six) hours as needed for mild pain or moderate pain. 08/06/17   Rayburn, Floyce Stakes, PA-C  aspirin 325 MG tablet Take 325 mg by mouth daily.    [provider]  atorvastatin (LIPITOR) 10 MG tablet Take 1 tablet (10 mg total) by mouth daily at 6 PM. Patient not taking: Reported on 08/02/2017 09/18/16   Robbie Lis, MD  calcium  carbonate (TUMS EX) 750 MG chewable tablet Chew 2 tablets by mouth daily.    [provider]  Cholecalciferol (VITAMIN D-3) 5000 units TABS Take 5,000 Units by mouth daily.    [provider]  dextroamphetamine (DEXTROSTAT) 5 MG tablet Take 5 mg by mouth daily.      [provider]  docusate sodium (COLACE) 100 MG capsule Take 1 capsule (100 mg total) by mouth 2 (two) times daily. 08/06/17   Rayburn, Floyce Stakes, PA-C  fluticasone (FLONASE) 50 MCG/ACT nasal spray Place 2 sprays into both nostrils daily.    [provider]  levocetirizine (XYZAL) 5 MG tablet Take 5 mg by mouth daily.    [provider]  lisinopril (PRINIVIL,ZESTRIL) 10 MG tablet Take 10 mg by mouth daily.    [provider]  Lutein 6 MG CAPS  Take 6 mg by mouth daily.      [provider]  Magnesium 300 MG CAPS Take 300 mg by mouth daily.     [provider]  meclizine (ANTIVERT) 25 MG tablet Take 25 mg by mouth 2 (two) times daily as needed for dizziness.     [provider]  montelukast (SINGULAIR) 10 MG tablet Take 10 mg by mouth daily.    [provider]  Multiple Vitamins-Minerals (HAIR/SKIN/NAILS/BIOTIN) TABS Take 1 tablet by mouth daily.    [provider]  mupirocin ointment (BACTROBAN) 2 % Apply 1 application topically 3 (three) times daily as needed (wound care/ itching).    [provider]  oxyCODONE (OXY IR/ROXICODONE) 5 MG immediate release tablet Take 1-2 tablets (5-10 mg total) by mouth every 4 (four) hours as needed for severe pain (May use for moderate breakthrough pain). 08/06/17   Rayburn, Floyce Stakes, PA-C  Polyethyl Glycol-Propyl Glycol (SYSTANE OP) Place 1 drop into both eyes 3 (three) times daily.    [provider]  Probiotic Product (ALIGN) 4 MG CAPS Take 4 mg by mouth daily.    [provider]  Psyllium (METAMUCIL FIBER PO) Take 1 scoop by mouth daily as needed (constipation).    [provider]  simvastatin (ZOCOR) 10 MG tablet Take 10 mg by mouth every evening.    [provider]  triamcinolone cream (KENALOG) 0.5 % Apply 1 application topically 2 (two) times daily as needed (rash/ eczema).    [provider]  vitamin B-12 (CYANOCOBALAMIN) 1000 MCG tablet Take 1,000 mcg by mouth daily.      [provider]    Family History Family History  Problem Relation Age of Onset  . Alzheimer's disease Mother   . Stroke Mother   . Coronary artery disease Father   . Ulcers Father     Social History Social History   Tobacco Use  . Smoking status: Never Smoker  . Smokeless tobacco: Never Used  Substance Use Topics  . Alcohol use: No  . Drug use: No     Allergies   Amoxicillin; Ciprofloxacin;  Clindamycin/lincomycin; Codeine; Penicillins; Tetanus toxoids; Atorvastatin; and Ultracet [tramadol-acetaminophen]   Review of Systems Review of Systems  HENT: Positive for facial swelling.   Musculoskeletal: Positive for arthralgias.  Skin: Positive for wound.  All other systems reviewed and are negative.    Physical Exam Updated Vital Signs BP (!) 152/79 (BP Location: Left Arm)   Pulse 97   Temp 97.6 F (36.4 C) (Oral)   Resp 14   Ht 5' (1.524 m)   Wt 55.8 kg   SpO2 100%  BMI 24.02 kg/m   Physical Exam  Constitutional: She is oriented to person, place, and time. She appears well-developed and well-nourished. No distress.  HENT:  Head: Normocephalic.  No septal hematoma.  She does have tenderness along the nasal bones with associated swelling.  Very small 66mm superficial laceration to bridge of the nose.  No hemotympanum.  Neck: Neck supple.  Cardiovascular: Normal rate, regular rhythm and normal heart sounds.  No murmur heard. Pulmonary/Chest: Effort normal and breath sounds normal. No respiratory distress.  Abdominal: Soft. She exhibits no distension. There is no tenderness.  Neurological: She is alert and oriented to person, place, and time.  Skin: Skin is warm and dry.  Nursing note and vitals reviewed.    ED Treatments / Results  Labs (all labs ordered are listed, but only abnormal results are displayed) Labs Reviewed - No data to display  EKG None  Radiology Ct Head Wo Contrast  Result Date: 10/22/2018 CLINICAL DATA:  Fall Struck head/face on floor Headache Facial lacerations/and contusions EXAM: CT HEAD WITHOUT CONTRAST CT MAXILLOFACIAL WITHOUT CONTRAST TECHNIQUE: Multidetector CT imaging of the head and maxillofacial structures were performed using the standard protocol without intravenous contrast. Multiplanar CT image reconstructions of the maxillofacial structures were also generated. COMPARISON:  08/02/2017 FINDINGS: CT HEAD FINDINGS Brain: There is  central and cortical atrophy. Periventricular white matter changes are consistent with small vessel disease. There is no intra or extra-axial fluid collection or mass lesion. The basilar cisterns and ventricles have a normal appearance. There is no CT evidence for acute infarction or hemorrhage. Vascular: No hyperdense vessel or unexpected calcification. Skull: Normal. Negative for fracture or focal lesion. Other: LEFT frontal scalp edema not associated with calvarial fracture. CT MAXILLOFACIAL FINDINGS Osseous: There are acute fractures of both nasal bones, associated with soft tissue swelling. Remote fracture of the LEFT zygomatic arch. Orbits: Negative. No traumatic or inflammatory finding. Remote fracture of the LEFT orbit. Sinuses: Remote fracture of the LEFT orbit and maxillary sinus. Soft tissues: Negative. IMPRESSION: 1.  No evidence for acute intracranial abnormality. 2. Bilateral nasal bone fractures and associated soft tissue swelling. 3. Remote fractures of the LEFT orbit and maxillary sinus. Electronically Signed   By: Nolon Nations M.D.   On: 10/22/2018 20:07   Dg Hand Complete Left  Result Date: 10/22/2018 CLINICAL DATA:  Patient fell in bathroom with swelling and ecchymosis of the hand. Pain also noted of the left fifth finger. EXAM: LEFT HAND - COMPLETE 3+ VIEW COMPARISON:  None. FINDINGS: Acute, closed, oblique fracture at the base of the left fifth metacarpal with slight proximal migration of the distal fracture fragment. Otherwise, the bones are demineralized as before with osteoarthritis of the first [facility] and triscaphe articulations. Healed fracture deformity of the included distal ulna with plate and screw fixation of the distal radius. No complicating features are identified. IMPRESSION: Acute, closed, oblique fracture at the base of the left fifth metacarpal with slight proximal migration of the distal fracture fragment. Chronic advanced osteoarthritic joint space narrowing of the  first CMC and triscaphe joints. Electronically Signed   By: Ashley Royalty M.D.   On: 10/22/2018 20:25   Ct Maxillofacial Wo Contrast  Result Date: 10/22/2018 CLINICAL DATA:  Fall Struck head/face on floor Headache Facial lacerations/and contusions EXAM: CT HEAD WITHOUT CONTRAST CT MAXILLOFACIAL WITHOUT CONTRAST TECHNIQUE: Multidetector CT imaging of the head and maxillofacial structures were performed using the standard protocol without intravenous contrast. Multiplanar CT image reconstructions of the maxillofacial structures were also generated.  COMPARISON:  08/02/2017 FINDINGS: CT HEAD FINDINGS Brain: There is central and cortical atrophy. Periventricular white matter changes are consistent with small vessel disease. There is no intra or extra-axial fluid collection or mass lesion. The basilar cisterns and ventricles have a normal appearance. There is no CT evidence for acute infarction or hemorrhage. Vascular: No hyperdense vessel or unexpected calcification. Skull: Normal. Negative for fracture or focal lesion. Other: LEFT frontal scalp edema not associated with calvarial fracture. CT MAXILLOFACIAL FINDINGS Osseous: There are acute fractures of both nasal bones, associated with soft tissue swelling. Remote fracture of the LEFT zygomatic arch. Orbits: Negative. No traumatic or inflammatory finding. Remote fracture of the LEFT orbit. Sinuses: Remote fracture of the LEFT orbit and maxillary sinus. Soft tissues: Negative. IMPRESSION: 1.  No evidence for acute intracranial abnormality. 2. Bilateral nasal bone fractures and associated soft tissue swelling. 3. Remote fractures of the LEFT orbit and maxillary sinus. Electronically Signed   By: Nolon Nations M.D.   On: 10/22/2018 20:07    Procedures Procedures (including critical care time)  SPLINT APPLICATION Date/Time: 1:85 PM Authorized by: Ozella Almond Ward Consent: Verbal consent obtained. Risks and benefits: risks, benefits and alternatives were  discussed Consent given by: patient Splint applied by: orthopedic technician Location details: Left upper extremity Splint type: ulnar gutter Post-procedure: The splinted body part was neurovascularly unchanged following the procedure. Patient tolerance: Patient tolerated the procedure well with no immediate complications.     Medications Ordered in ED Medications - No data to display   Initial Impression / Assessment and Plan / ED Course  I have reviewed the triage vital signs and the nursing notes.  Pertinent labs & imaging results that were available during my care of the patient were reviewed by me and considered in my medical decision making (see chart for details).    Londa DANEE SOLLER is a 84 y.o. female who presents to ED for evaluation following mechanical fall just prior to arrival.  Per family at bedside, patient has had multiple falls due to balance issues.  I tried to convince patient to let me write prescription for walker as I felt this would help her prevent further falls.  We discussed this at length, however patient declines stating that she does not need anything to help her walk around her home.  He did fall face first, striking nose and headache head.  She landed on her left hand as well.  No focal neuro deficits noted and she is neurovascularly intact in all 4 extremities.  CT head negative.  CT maxillofacial does show bilateral nasal bone fractures with associated soft tissue swelling.  No septal hematoma on exam.  She had a very superficial abrasion to the bridge of the nose.  This is not anything requiring repair, however will start on antibiotic prophylaxis given underlying fractures.  Plain film of the hand shows acute closed oblique fracture of the base of the fifth metacarpal.  She is placed in ulnar gutter splint.  She has seen Dr. Amedeo Plenty before for injury from previous fall.  Encouraged her to call Monday morning to schedule follow-up.  ENT follow-up provided  as well.  Family at bedside will stay with her tonight.  Reasons to return to ER discussed.  All questions answered.  Final Clinical Impressions(s) / ED Diagnoses   Final diagnoses:  Closed fracture of nasal bone, initial encounter  Closed nondisplaced fracture of base of fifth metacarpal bone of left hand, initial encounter    ED Discharge Orders  None       Ward, Ozella Almond, PA-C 10/22/18 2306    Virgel Manifold, MD 10/23/18 [phone]

## 2018-10-22 NOTE — Discharge Instructions (Signed)
It was my pleasure taking care of you today!   Please take all of your antibiotics until finished!  Ice to affected area as needed for pain / swelling.  Tylenol and/or ibuprofen as needed for pain.   Call Dr. Vanetta Shawl office on Monday to arrange follow up.   Call ENT doctor to schedule follow up appointment.   Return to ER for new or worsening symptoms, any additional concerns.

## 2018-10-24 DIAGNOSIS — S022XXA Fracture of nasal bones, initial encounter for closed fracture: Secondary | ICD-10-CM | POA: Diagnosis not present

## 2018-10-24 DIAGNOSIS — S62102A Fracture of unspecified carpal bone, left wrist, initial encounter for closed fracture: Secondary | ICD-10-CM | POA: Diagnosis not present

## 2018-10-24 DIAGNOSIS — Z23 Encounter for immunization: Secondary | ICD-10-CM | POA: Diagnosis not present

## 2018-10-24 DIAGNOSIS — I129 Hypertensive chronic kidney disease with stage 1 through stage 4 chronic kidney disease, or unspecified chronic kidney disease: Secondary | ICD-10-CM | POA: Diagnosis not present

## 2018-10-24 DIAGNOSIS — N183 Chronic kidney disease, stage 3 (moderate): Secondary | ICD-10-CM | POA: Diagnosis not present

## 2018-10-31 DIAGNOSIS — S62306A Unspecified fracture of fifth metacarpal bone, right hand, initial encounter for closed fracture: Secondary | ICD-10-CM | POA: Diagnosis not present

## 2018-10-31 DIAGNOSIS — M79642 Pain in left hand: Secondary | ICD-10-CM | POA: Diagnosis not present

## 2018-11-30 DIAGNOSIS — S62307D Unspecified fracture of fifth metacarpal bone, left hand, subsequent encounter for fracture with routine healing: Secondary | ICD-10-CM | POA: Diagnosis not present

## 2018-11-30 DIAGNOSIS — M79642 Pain in left hand: Secondary | ICD-10-CM | POA: Diagnosis not present

## 2019-06-28 DIAGNOSIS — Z1389 Encounter for screening for other disorder: Secondary | ICD-10-CM | POA: Diagnosis not present

## 2019-06-28 DIAGNOSIS — R42 Dizziness and giddiness: Secondary | ICD-10-CM | POA: Diagnosis not present

## 2019-06-28 DIAGNOSIS — G47429 Narcolepsy in conditions classified elsewhere without cataplexy: Secondary | ICD-10-CM | POA: Diagnosis not present

## 2019-06-28 DIAGNOSIS — G250 Essential tremor: Secondary | ICD-10-CM | POA: Diagnosis not present

## 2019-06-28 DIAGNOSIS — Z79899 Other long term (current) drug therapy: Secondary | ICD-10-CM | POA: Diagnosis not present

## 2019-06-28 DIAGNOSIS — N183 Chronic kidney disease, stage 3 (moderate): Secondary | ICD-10-CM | POA: Diagnosis not present

## 2019-06-28 DIAGNOSIS — L853 Xerosis cutis: Secondary | ICD-10-CM | POA: Diagnosis not present

## 2019-06-28 DIAGNOSIS — I129 Hypertensive chronic kidney disease with stage 1 through stage 4 chronic kidney disease, or unspecified chronic kidney disease: Secondary | ICD-10-CM | POA: Diagnosis not present

## 2019-06-28 DIAGNOSIS — Z0000 Encounter for general adult medical examination without abnormal findings: Secondary | ICD-10-CM | POA: Diagnosis not present

## 2019-06-28 DIAGNOSIS — M810 Age-related osteoporosis without current pathological fracture: Secondary | ICD-10-CM | POA: Diagnosis not present

## 2019-06-28 DIAGNOSIS — E7800 Pure hypercholesterolemia, unspecified: Secondary | ICD-10-CM | POA: Diagnosis not present

## 2019-08-15 DIAGNOSIS — M810 Age-related osteoporosis without current pathological fracture: Secondary | ICD-10-CM | POA: Diagnosis not present

## 2019-08-15 DIAGNOSIS — N183 Chronic kidney disease, stage 3 (moderate): Secondary | ICD-10-CM | POA: Diagnosis not present

## 2019-08-15 DIAGNOSIS — I129 Hypertensive chronic kidney disease with stage 1 through stage 4 chronic kidney disease, or unspecified chronic kidney disease: Secondary | ICD-10-CM | POA: Diagnosis not present

## 2019-11-29 DIAGNOSIS — Z23 Encounter for immunization: Secondary | ICD-10-CM | POA: Diagnosis not present

## 2020-01-23 DIAGNOSIS — G47419 Narcolepsy without cataplexy: Secondary | ICD-10-CM | POA: Diagnosis not present

## 2020-02-08 DIAGNOSIS — Z79899 Other long term (current) drug therapy: Secondary | ICD-10-CM | POA: Diagnosis not present

## 2020-02-08 DIAGNOSIS — R600 Localized edema: Secondary | ICD-10-CM | POA: Diagnosis not present

## 2020-02-08 DIAGNOSIS — N1831 Chronic kidney disease, stage 3a: Secondary | ICD-10-CM | POA: Diagnosis not present

## 2020-02-08 DIAGNOSIS — N390 Urinary tract infection, site not specified: Secondary | ICD-10-CM | POA: Diagnosis not present

## 2020-02-08 DIAGNOSIS — I129 Hypertensive chronic kidney disease with stage 1 through stage 4 chronic kidney disease, or unspecified chronic kidney disease: Secondary | ICD-10-CM | POA: Diagnosis not present

## 2020-02-08 DIAGNOSIS — R8290 Unspecified abnormal findings in urine: Secondary | ICD-10-CM | POA: Diagnosis not present

## 2020-03-28 DIAGNOSIS — I129 Hypertensive chronic kidney disease with stage 1 through stage 4 chronic kidney disease, or unspecified chronic kidney disease: Secondary | ICD-10-CM | POA: Diagnosis not present

## 2020-03-28 DIAGNOSIS — E7800 Pure hypercholesterolemia, unspecified: Secondary | ICD-10-CM | POA: Diagnosis not present

## 2020-03-28 DIAGNOSIS — N1831 Chronic kidney disease, stage 3a: Secondary | ICD-10-CM | POA: Diagnosis not present

## 2020-03-28 DIAGNOSIS — M810 Age-related osteoporosis without current pathological fracture: Secondary | ICD-10-CM | POA: Diagnosis not present

## 2020-05-14 ENCOUNTER — Ambulatory Visit
Admission: RE | Admit: 2020-05-14 | Discharge: 2020-05-14 | Disposition: A | Discharge status: Home / Self Care | Payer: Medicare Other | Source: Ambulatory Visit | Attending: Geriatric Medicine | Admitting: Geriatric Medicine

## 2020-05-14 ENCOUNTER — Other Ambulatory Visit: Payer: Self-pay | Admitting: Geriatric Medicine

## 2020-05-14 DIAGNOSIS — R0781 Pleurodynia: Secondary | ICD-10-CM | POA: Diagnosis not present

## 2020-05-14 DIAGNOSIS — M545 Low back pain, unspecified: Secondary | ICD-10-CM

## 2020-05-14 DIAGNOSIS — S2242XA Multiple fractures of ribs, left side, initial encounter for closed fracture: Secondary | ICD-10-CM | POA: Diagnosis not present

## 2020-05-30 DIAGNOSIS — S32050D Wedge compression fracture of fifth lumbar vertebra, subsequent encounter for fracture with routine healing: Secondary | ICD-10-CM | POA: Diagnosis not present

## 2020-07-11 DIAGNOSIS — G47429 Narcolepsy in conditions classified elsewhere without cataplexy: Secondary | ICD-10-CM | POA: Diagnosis not present

## 2020-07-11 DIAGNOSIS — I129 Hypertensive chronic kidney disease with stage 1 through stage 4 chronic kidney disease, or unspecified chronic kidney disease: Secondary | ICD-10-CM | POA: Diagnosis not present

## 2020-07-11 DIAGNOSIS — Z79899 Other long term (current) drug therapy: Secondary | ICD-10-CM | POA: Diagnosis not present

## 2020-07-11 DIAGNOSIS — N1831 Chronic kidney disease, stage 3a: Secondary | ICD-10-CM | POA: Diagnosis not present

## 2020-07-11 DIAGNOSIS — G47411 Narcolepsy with cataplexy: Secondary | ICD-10-CM | POA: Diagnosis not present

## 2020-08-13 DIAGNOSIS — E7800 Pure hypercholesterolemia, unspecified: Secondary | ICD-10-CM | POA: Diagnosis not present

## 2020-08-13 DIAGNOSIS — M810 Age-related osteoporosis without current pathological fracture: Secondary | ICD-10-CM | POA: Diagnosis not present

## 2020-08-13 DIAGNOSIS — I129 Hypertensive chronic kidney disease with stage 1 through stage 4 chronic kidney disease, or unspecified chronic kidney disease: Secondary | ICD-10-CM | POA: Diagnosis not present

## 2020-08-13 DIAGNOSIS — N1831 Chronic kidney disease, stage 3a: Secondary | ICD-10-CM | POA: Diagnosis not present

## 2020-10-18 DIAGNOSIS — E7800 Pure hypercholesterolemia, unspecified: Secondary | ICD-10-CM | POA: Diagnosis not present

## 2020-10-18 DIAGNOSIS — M810 Age-related osteoporosis without current pathological fracture: Secondary | ICD-10-CM | POA: Diagnosis not present

## 2020-10-18 DIAGNOSIS — I129 Hypertensive chronic kidney disease with stage 1 through stage 4 chronic kidney disease, or unspecified chronic kidney disease: Secondary | ICD-10-CM | POA: Diagnosis not present

## 2020-10-18 DIAGNOSIS — N1831 Chronic kidney disease, stage 3a: Secondary | ICD-10-CM | POA: Diagnosis not present

## 2020-11-27 DIAGNOSIS — Z23 Encounter for immunization: Secondary | ICD-10-CM | POA: Diagnosis not present

## 2020-12-26 DIAGNOSIS — M810 Age-related osteoporosis without current pathological fracture: Secondary | ICD-10-CM | POA: Diagnosis not present

## 2020-12-26 DIAGNOSIS — E7800 Pure hypercholesterolemia, unspecified: Secondary | ICD-10-CM | POA: Diagnosis not present

## 2020-12-26 DIAGNOSIS — I129 Hypertensive chronic kidney disease with stage 1 through stage 4 chronic kidney disease, or unspecified chronic kidney disease: Secondary | ICD-10-CM | POA: Diagnosis not present

## 2020-12-26 DIAGNOSIS — N1831 Chronic kidney disease, stage 3a: Secondary | ICD-10-CM | POA: Diagnosis not present

## 2021-02-04 DIAGNOSIS — M810 Age-related osteoporosis without current pathological fracture: Secondary | ICD-10-CM | POA: Diagnosis not present

## 2021-02-04 DIAGNOSIS — N1831 Chronic kidney disease, stage 3a: Secondary | ICD-10-CM | POA: Diagnosis not present

## 2021-02-04 DIAGNOSIS — E7800 Pure hypercholesterolemia, unspecified: Secondary | ICD-10-CM | POA: Diagnosis not present

## 2021-02-04 DIAGNOSIS — I129 Hypertensive chronic kidney disease with stage 1 through stage 4 chronic kidney disease, or unspecified chronic kidney disease: Secondary | ICD-10-CM | POA: Diagnosis not present

## 2021-05-06 DIAGNOSIS — R42 Dizziness and giddiness: Secondary | ICD-10-CM | POA: Diagnosis not present

## 2021-05-06 DIAGNOSIS — R059 Cough, unspecified: Secondary | ICD-10-CM | POA: Diagnosis not present

## 2021-05-06 DIAGNOSIS — R410 Disorientation, unspecified: Secondary | ICD-10-CM | POA: Diagnosis not present

## 2021-05-07 DIAGNOSIS — N1831 Chronic kidney disease, stage 3a: Secondary | ICD-10-CM | POA: Diagnosis not present

## 2021-05-07 DIAGNOSIS — M810 Age-related osteoporosis without current pathological fracture: Secondary | ICD-10-CM | POA: Diagnosis not present

## 2021-05-07 DIAGNOSIS — I129 Hypertensive chronic kidney disease with stage 1 through stage 4 chronic kidney disease, or unspecified chronic kidney disease: Secondary | ICD-10-CM | POA: Diagnosis not present

## 2021-05-07 DIAGNOSIS — E7800 Pure hypercholesterolemia, unspecified: Secondary | ICD-10-CM | POA: Diagnosis not present

## 2021-06-13 DIAGNOSIS — N1831 Chronic kidney disease, stage 3a: Secondary | ICD-10-CM | POA: Diagnosis not present

## 2021-06-13 DIAGNOSIS — E7800 Pure hypercholesterolemia, unspecified: Secondary | ICD-10-CM | POA: Diagnosis not present

## 2021-06-13 DIAGNOSIS — M810 Age-related osteoporosis without current pathological fracture: Secondary | ICD-10-CM | POA: Diagnosis not present

## 2021-06-13 DIAGNOSIS — I129 Hypertensive chronic kidney disease with stage 1 through stage 4 chronic kidney disease, or unspecified chronic kidney disease: Secondary | ICD-10-CM | POA: Diagnosis not present

## 2021-08-11 DIAGNOSIS — E7800 Pure hypercholesterolemia, unspecified: Secondary | ICD-10-CM | POA: Diagnosis not present

## 2021-08-11 DIAGNOSIS — I129 Hypertensive chronic kidney disease with stage 1 through stage 4 chronic kidney disease, or unspecified chronic kidney disease: Secondary | ICD-10-CM | POA: Diagnosis not present

## 2021-08-11 DIAGNOSIS — N1831 Chronic kidney disease, stage 3a: Secondary | ICD-10-CM | POA: Diagnosis not present

## 2021-08-11 DIAGNOSIS — M810 Age-related osteoporosis without current pathological fracture: Secondary | ICD-10-CM | POA: Diagnosis not present

## 2022-01-09 DIAGNOSIS — E7800 Pure hypercholesterolemia, unspecified: Secondary | ICD-10-CM | POA: Diagnosis not present

## 2022-01-09 DIAGNOSIS — R634 Abnormal weight loss: Secondary | ICD-10-CM | POA: Diagnosis not present

## 2022-01-09 DIAGNOSIS — Z1389 Encounter for screening for other disorder: Secondary | ICD-10-CM | POA: Diagnosis not present

## 2022-01-09 DIAGNOSIS — Z79899 Other long term (current) drug therapy: Secondary | ICD-10-CM | POA: Diagnosis not present

## 2022-01-09 DIAGNOSIS — I129 Hypertensive chronic kidney disease with stage 1 through stage 4 chronic kidney disease, or unspecified chronic kidney disease: Secondary | ICD-10-CM | POA: Diagnosis not present

## 2022-01-09 DIAGNOSIS — Z23 Encounter for immunization: Secondary | ICD-10-CM | POA: Diagnosis not present

## 2022-01-09 DIAGNOSIS — G47429 Narcolepsy in conditions classified elsewhere without cataplexy: Secondary | ICD-10-CM | POA: Diagnosis not present

## 2022-01-09 DIAGNOSIS — N1831 Chronic kidney disease, stage 3a: Secondary | ICD-10-CM | POA: Diagnosis not present

## 2022-01-09 DIAGNOSIS — R269 Unspecified abnormalities of gait and mobility: Secondary | ICD-10-CM | POA: Diagnosis not present

## 2022-01-09 DIAGNOSIS — Z0000 Encounter for general adult medical examination without abnormal findings: Secondary | ICD-10-CM | POA: Diagnosis not present

## 2022-08-17 DIAGNOSIS — G301 Alzheimer's disease with late onset: Secondary | ICD-10-CM | POA: Diagnosis not present

## 2022-08-17 DIAGNOSIS — Z79899 Other long term (current) drug therapy: Secondary | ICD-10-CM | POA: Diagnosis not present

## 2022-08-17 DIAGNOSIS — I129 Hypertensive chronic kidney disease with stage 1 through stage 4 chronic kidney disease, or unspecified chronic kidney disease: Secondary | ICD-10-CM | POA: Diagnosis not present

## 2022-08-17 DIAGNOSIS — R634 Abnormal weight loss: Secondary | ICD-10-CM | POA: Diagnosis not present

## 2022-08-17 DIAGNOSIS — N1831 Chronic kidney disease, stage 3a: Secondary | ICD-10-CM | POA: Diagnosis not present

## 2022-08-17 DIAGNOSIS — F02B0 Dementia in other diseases classified elsewhere, moderate, without behavioral disturbance, psychotic disturbance, mood disturbance, and anxiety: Secondary | ICD-10-CM | POA: Diagnosis not present

## 2022-10-28 DIAGNOSIS — Z0000 Encounter for general adult medical examination without abnormal findings: Secondary | ICD-10-CM | POA: Diagnosis not present

## 2022-10-28 DIAGNOSIS — Z8673 Personal history of transient ischemic attack (TIA), and cerebral infarction without residual deficits: Secondary | ICD-10-CM | POA: Diagnosis not present

## 2022-10-28 DIAGNOSIS — E785 Hyperlipidemia, unspecified: Secondary | ICD-10-CM | POA: Diagnosis not present

## 2022-10-28 DIAGNOSIS — H8110 Benign paroxysmal vertigo, unspecified ear: Secondary | ICD-10-CM | POA: Diagnosis not present

## 2022-10-28 DIAGNOSIS — I83893 Varicose veins of bilateral lower extremities with other complications: Secondary | ICD-10-CM | POA: Diagnosis not present

## 2022-10-28 DIAGNOSIS — I10 Essential (primary) hypertension: Secondary | ICD-10-CM | POA: Diagnosis not present

## 2022-10-28 DIAGNOSIS — R2681 Unsteadiness on feet: Secondary | ICD-10-CM | POA: Diagnosis not present

## 2022-11-09 DIAGNOSIS — Z0001 Encounter for general adult medical examination with abnormal findings: Secondary | ICD-10-CM | POA: Diagnosis not present

## 2022-11-09 DIAGNOSIS — G47419 Narcolepsy without cataplexy: Secondary | ICD-10-CM | POA: Diagnosis not present

## 2022-11-09 DIAGNOSIS — N189 Chronic kidney disease, unspecified: Secondary | ICD-10-CM | POA: Diagnosis not present

## 2022-11-09 DIAGNOSIS — G301 Alzheimer's disease with late onset: Secondary | ICD-10-CM | POA: Diagnosis not present

## 2022-11-09 DIAGNOSIS — N1831 Chronic kidney disease, stage 3a: Secondary | ICD-10-CM | POA: Diagnosis not present

## 2022-11-09 DIAGNOSIS — I129 Hypertensive chronic kidney disease with stage 1 through stage 4 chronic kidney disease, or unspecified chronic kidney disease: Secondary | ICD-10-CM | POA: Diagnosis not present

## 2022-11-09 DIAGNOSIS — I700 Atherosclerosis of aorta: Secondary | ICD-10-CM | POA: Diagnosis not present

## 2022-11-09 DIAGNOSIS — F0280 Dementia in other diseases classified elsewhere without behavioral disturbance: Secondary | ICD-10-CM | POA: Diagnosis not present

## 2022-11-09 DIAGNOSIS — Z79899 Other long term (current) drug therapy: Secondary | ICD-10-CM | POA: Diagnosis not present

## 2022-11-09 DIAGNOSIS — I10 Essential (primary) hypertension: Secondary | ICD-10-CM | POA: Diagnosis not present

## 2022-11-09 DIAGNOSIS — I83893 Varicose veins of bilateral lower extremities with other complications: Secondary | ICD-10-CM | POA: Diagnosis not present

## 2022-11-09 DIAGNOSIS — Z23 Encounter for immunization: Secondary | ICD-10-CM | POA: Diagnosis not present

## 2022-11-09 DIAGNOSIS — E559 Vitamin D deficiency, unspecified: Secondary | ICD-10-CM | POA: Diagnosis not present

## 2022-11-09 DIAGNOSIS — E46 Unspecified protein-calorie malnutrition: Secondary | ICD-10-CM | POA: Diagnosis not present

## 2022-12-31 ENCOUNTER — Encounter (HOSPITAL_COMMUNITY): Payer: Self-pay

## 2022-12-31 ENCOUNTER — Emergency Department (HOSPITAL_COMMUNITY): Payer: Medicare Other

## 2022-12-31 ENCOUNTER — Emergency Department (HOSPITAL_COMMUNITY)
Admission: EM | Admit: 2022-12-31 | Discharge: 2022-12-31 | Disposition: A | Discharge status: Home / Self Care | Payer: Medicare Other | Attending: Emergency Medicine | Admitting: Emergency Medicine

## 2022-12-31 ENCOUNTER — Other Ambulatory Visit: Payer: Self-pay

## 2022-12-31 DIAGNOSIS — R3589 Other polyuria: Secondary | ICD-10-CM | POA: Diagnosis not present

## 2022-12-31 DIAGNOSIS — M25551 Pain in right hip: Secondary | ICD-10-CM | POA: Insufficient documentation

## 2022-12-31 DIAGNOSIS — R930 Abnormal findings on diagnostic imaging of skull and head, not elsewhere classified: Secondary | ICD-10-CM | POA: Insufficient documentation

## 2022-12-31 DIAGNOSIS — I10 Essential (primary) hypertension: Secondary | ICD-10-CM | POA: Insufficient documentation

## 2022-12-31 DIAGNOSIS — R531 Weakness: Secondary | ICD-10-CM | POA: Diagnosis not present

## 2022-12-31 DIAGNOSIS — M25451 Effusion, right hip: Secondary | ICD-10-CM | POA: Diagnosis not present

## 2022-12-31 DIAGNOSIS — Z043 Encounter for examination and observation following other accident: Secondary | ICD-10-CM | POA: Diagnosis not present

## 2022-12-31 DIAGNOSIS — Z79899 Other long term (current) drug therapy: Secondary | ICD-10-CM | POA: Insufficient documentation

## 2022-12-31 DIAGNOSIS — I639 Cerebral infarction, unspecified: Secondary | ICD-10-CM | POA: Diagnosis not present

## 2022-12-31 DIAGNOSIS — M1611 Unilateral primary osteoarthritis, right hip: Secondary | ICD-10-CM | POA: Diagnosis not present

## 2022-12-31 DIAGNOSIS — Z7982 Long term (current) use of aspirin: Secondary | ICD-10-CM | POA: Diagnosis not present

## 2022-12-31 DIAGNOSIS — S0990XA Unspecified injury of head, initial encounter: Secondary | ICD-10-CM | POA: Diagnosis not present

## 2022-12-31 DIAGNOSIS — M79632 Pain in left forearm: Secondary | ICD-10-CM | POA: Insufficient documentation

## 2022-12-31 DIAGNOSIS — N3001 Acute cystitis with hematuria: Secondary | ICD-10-CM | POA: Diagnosis not present

## 2022-12-31 DIAGNOSIS — R000 Tachycardia, unspecified: Secondary | ICD-10-CM | POA: Diagnosis not present

## 2022-12-31 LAB — CBC WITH DIFFERENTIAL/PLATELET
Abs Immature Granulocytes: 0.04 10*3/uL (ref 0.00–0.07)
Basophils Absolute: 0.1 10*3/uL (ref 0.0–0.1)
Basophils Relative: 1 %
Eosinophils Absolute: 0.2 10*3/uL (ref 0.0–0.5)
Eosinophils Relative: 2 %
HCT: 39.8 % (ref 36.0–46.0)
Hemoglobin: 12 g/dL (ref 12.0–15.0)
Immature Granulocytes: 1 %
Lymphocytes Relative: 11 %
Lymphs Abs: 0.9 10*3/uL (ref 0.7–4.0)
MCH: 27.2 pg (ref 26.0–34.0)
MCHC: 30.2 g/dL (ref 30.0–36.0)
MCV: 90.2 fL (ref 80.0–100.0)
Monocytes Absolute: 1.2 10*3/uL — ABNORMAL HIGH (ref 0.1–1.0)
Monocytes Relative: 14 %
Neutro Abs: 6 10*3/uL (ref 1.7–7.7)
Neutrophils Relative %: 71 %
Platelets: 365 10*3/uL (ref 150–400)
RBC: 4.41 MIL/uL (ref 3.87–5.11)
RDW: 13.3 % (ref 11.5–15.5)
WBC: 8.4 10*3/uL (ref 4.0–10.5)
nRBC: 0 % (ref 0.0–0.2)

## 2022-12-31 LAB — URINALYSIS, ROUTINE W REFLEX MICROSCOPIC
Bilirubin Urine: NEGATIVE
Glucose, UA: NEGATIVE mg/dL
Ketones, ur: 5 mg/dL — AB
Nitrite: NEGATIVE
Protein, ur: 100 mg/dL — AB
Specific Gravity, Urine: 1.016 (ref 1.005–1.030)
WBC, UA: >50 WBC/hpf — ABNORMAL HIGH (ref 0–5)
pH: 7 (ref 5.0–8.0)

## 2022-12-31 LAB — COMPREHENSIVE METABOLIC PANEL
ALT: 17 U/L (ref 0–44)
AST: 34 U/L (ref 15–41)
Albumin: 3.5 g/dL (ref 3.5–5.0)
Alkaline Phosphatase: 45 U/L (ref 38–126)
Anion gap: 6 (ref 5–15)
BUN: 16 mg/dL (ref 8–23)
CO2: 27 mmol/L (ref 22–32)
Calcium: 9.2 mg/dL (ref 8.9–10.3)
Chloride: 101 mmol/L (ref 98–111)
Creatinine, Ser: 0.86 mg/dL (ref 0.44–1.00)
GFR, Estimated: >60 mL/min (ref >60)
Glucose, Bld: 119 mg/dL — ABNORMAL HIGH (ref 70–99)
Potassium: 3.5 mmol/L (ref 3.5–5.1)
Sodium: 134 mmol/L — ABNORMAL LOW (ref 135–145)
Total Bilirubin: 0.6 mg/dL (ref 0.3–1.2)
Total Protein: 7.3 g/dL (ref 6.5–8.1)

## 2022-12-31 MED ORDER — ACETAMINOPHEN 325 MG PO TABS
650.0000 mg | ORAL_TABLET | Freq: Once | ORAL | Status: AC
  Administered 2022-12-31: 650 mg via ORAL
  Filled 2022-12-31: qty 2

## 2022-12-31 MED ORDER — SULFAMETHOXAZOLE-TRIMETHOPRIM 800-160 MG PO TABS
1.0000 | ORAL_TABLET | Freq: Once | ORAL | Status: AC
  Administered 2022-12-31: 1 via ORAL
  Filled 2022-12-31: qty 1

## 2022-12-31 MED ORDER — SULFAMETHOXAZOLE-TRIMETHOPRIM 800-160 MG PO TABS: 1.0000 | ORAL_TABLET | Freq: Two times a day (BID) | ORAL | 0 refills | Status: DC

## 2022-12-31 NOTE — ED Provider Triage Note (Signed)
Emergency Medicine Provider Triage Evaluation Note  [name] , a 89 y.o. female  was evaluated in triage.  Pt complains of falling out of her bed several days ago. States "I hurt all over." Bedroom is carpeted. States able to walk. No blood thinner use.   Per EMS urine has been foul smelling and more frequent urination last 2 days.   Review of Systems  Positive: pain Negative: headache  Physical Exam  BP (!) 160/103   Pulse (!) 104   Temp 98.3 F (36.8 C) (Oral)   Resp 18   SpO2 100%  Gen:   Awake, no distress   Resp:  Normal effort  MSK:   Moves extremities without difficulty  Other:  TTP of R femur/tibia/fibula/hip  Medical Decision Making  Medically screening exam initiated at 11:37 AM.  Appropriate orders placed.  [name] was informed that the remainder of the evaluation will be completed by another provider, this initial triage assessment does not replace that evaluation, and the importance of remaining in the ED until their evaluation is complete.     Osvaldo Shipper, [place] 12/31/22 1141

## 2022-12-31 NOTE — ED Notes (Signed)
Dc instructions and scripts reviewed with pt and family no questions or concerns at this time. Will follow up with pcp next week.

## 2022-12-31 NOTE — ED Triage Notes (Signed)
EMS stated, she rolled off the bed last night and family went to get her up and she felt more weaker than usual. For the last 2 days more frequent urination with a foul oder

## 2022-12-31 NOTE — Discharge Instructions (Signed)
Drink plenty of fluid with.  Follow-up with your family doctor the beginning and next week for recheck.  Return if any problem

## 2022-12-31 NOTE — ED Provider Notes (Signed)
[facility] EMERGENCY DEPARTMENT Provider Note   CSN: [id number] Arrival date & time: 12/31/22  1057     History  Chief Complaint  Patient presents with   Fall   Polyuria    [name] is a 89 y.o. female.  Patient's history of hypertension, osteoporosis, she has felt weak recently and has fallen out of bed  The history is provided by the patient and medical records. No language interpreter was used.  Fall This is a new problem. The current episode started 6 to 12 hours ago. The problem occurs rarely. The problem has been resolved. Pertinent negatives include no chest pain, no abdominal pain and no headaches. Nothing aggravates the symptoms. Nothing relieves the symptoms. She has tried nothing for the symptoms. The treatment provided no relief.       Home Medications Prior to Admission medications   Medication Sig Start Date End Date Taking? Authorizing Provider  sulfamethoxazole-trimethoprim (BACTRIM DS) 800-160 MG tablet Take 1 tablet by mouth 2 (two) times daily for 7 days. 12/31/22 01/07/23 Yes Milton Ferguson, MD  acetaminophen (TYLENOL) 325 MG tablet Take 2 tablets (650 mg total) by mouth every 6 (six) hours as needed for mild pain or moderate pain. 08/06/17   Norm Parcel, PA-C  aspirin 325 MG tablet Take 325 mg by mouth daily.    [provider]  atorvastatin (LIPITOR) 10 MG tablet Take 1 tablet (10 mg total) by mouth daily at 6 PM. Patient not taking: Reported on 08/02/2017 09/18/16   Robbie Lis, MD  calcium carbonate (TUMS EX) 750 MG chewable tablet Chew 2 tablets by mouth daily.    [provider]  Cholecalciferol (VITAMIN D-3) 5000 units TABS Take 5,000 Units by mouth daily.    [provider]  dextroamphetamine (DEXTROSTAT) 5 MG tablet Take 5 mg by mouth daily.      [provider]  docusate sodium (COLACE) 100 MG capsule Take 1 capsule (100 mg total) by mouth 2 (two) times daily. 08/06/17   Norm Parcel, PA-C  fluticasone (FLONASE) 50 MCG/ACT nasal spray Place 2 sprays into both nostrils daily.    [provider]  levocetirizine (XYZAL) 5 MG tablet Take 5 mg by mouth daily.    [provider]  lisinopril (PRINIVIL,ZESTRIL) 10 MG tablet Take 10 mg by mouth daily.    [provider]  Lutein 6 MG CAPS Take 6 mg by mouth daily.      [provider]  Magnesium 300 MG CAPS Take 300 mg by mouth daily.     [provider]  meclizine (ANTIVERT) 25 MG tablet Take 25 mg by mouth 2 (two) times daily as needed for dizziness.     [provider]  montelukast (SINGULAIR) 10 MG tablet Take 10 mg by mouth daily.    [provider]  Multiple Vitamins-Minerals (HAIR/SKIN/NAILS/BIOTIN) TABS Take 1 tablet by mouth daily.    [provider]  mupirocin ointment (BACTROBAN) 2 % Apply 1 application topically 3 (three) times daily as needed (wound care/ itching).    [provider]  oxyCODONE (OXY IR/ROXICODONE) 5 MG immediate release tablet Take 1-2 tablets (5-10 mg total) by mouth every 4 (four) hours as needed for severe pain (May use for moderate breakthrough pain). 08/06/17   Norm Parcel, PA-C  Polyethyl Glycol-Propyl Glycol (SYSTANE OP) Place 1 drop into both eyes 3 (three) times daily.    [provider]  Probiotic Product (ALIGN) 4 MG  CAPS Take 4 mg by mouth daily.    [provider]  Psyllium (METAMUCIL FIBER PO) Take 1 scoop by mouth daily as needed (constipation).    [provider]  simvastatin (ZOCOR) 10 MG tablet Take 10 mg by mouth every evening.    [provider]  triamcinolone cream (KENALOG) 0.5 % Apply 1 application topically 2 (two) times daily as needed (rash/ eczema).    [provider]  vitamin B-12 (CYANOCOBALAMIN) 1000 MCG tablet Take 1,000 mcg by mouth daily.      [provider]      Allergies    Amoxicillin, Ciprofloxacin, Clindamycin/lincomycin,  Codeine, Penicillins, Tetanus toxoids, Atorvastatin, and Ultracet [tramadol-acetaminophen]    Review of Systems   Review of Systems  Constitutional:  Negative for appetite change and fatigue.  HENT:  Negative for congestion, ear discharge and sinus pressure.   Eyes:  Negative for discharge.  Respiratory:  Negative for cough.   Cardiovascular:  Negative for chest pain.  Gastrointestinal:  Negative for abdominal pain and diarrhea.  Genitourinary:  Negative for frequency and hematuria.  Musculoskeletal:  Negative for back pain.       Pain left forearm and right hip  Skin:  Negative for rash.  Neurological:  Negative for seizures and headaches.  Psychiatric/Behavioral:  Negative for hallucinations.     Physical Exam Updated Vital Signs BP (!) 147/77   Pulse 98   Temp 98 F (36.7 C) (Oral)   Resp 19   SpO2 96%  Physical Exam Vitals and nursing note reviewed.  Constitutional:      Appearance: She is well-developed.  HENT:     Head: Normocephalic.     Mouth/Throat:     Mouth: Mucous membranes are moist.  Eyes:     General: No scleral icterus.    Conjunctiva/sclera: Conjunctivae normal.  Neck:     Thyroid: No thyromegaly.  Cardiovascular:     Rate and Rhythm: Normal rate and regular rhythm.     Heart sounds: No murmur heard.    No friction rub. No gallop.  Pulmonary:     Breath sounds: No stridor. No wheezing or rales.  Chest:     Chest wall: No tenderness.  Abdominal:     General: There is no distension.     Tenderness: There is no abdominal tenderness. There is no rebound.  Musculoskeletal:     Cervical back: Neck supple.     Comments: Tender right hip and left forearm  Lymphadenopathy:     Cervical: No cervical adenopathy.  Skin:    Findings: No erythema or rash.  Neurological:     Mental Status: She is oriented to person, place, and time.     Motor: No abnormal muscle tone.     Coordination: Coordination normal.  Psychiatric:        Behavior: Behavior normal.      ED Results / Procedures / Treatments   Labs (all labs ordered are listed, but only abnormal results are displayed) Labs Reviewed  CBC WITH DIFFERENTIAL/PLATELET - Abnormal; Notable for the following components:      Result Value   Monocytes Absolute 1.2 (*)    All other components within normal limits  COMPREHENSIVE METABOLIC PANEL - Abnormal; Notable for the following components:   Sodium 134 (*)    Glucose, Bld 119 (*)    All other components within normal limits  URINALYSIS, ROUTINE W REFLEX MICROSCOPIC - Abnormal; Notable for the following components:   APPearance CLOUDY (*)  Hgb urine dipstick MODERATE (*)    Ketones, ur 5 (*)    Protein, ur 100 (*)    Leukocytes,Ua LARGE (*)    WBC, UA >50 (*)    Bacteria, UA RARE (*)    All other components within normal limits  URINE CULTURE  URINALYSIS, ROUTINE W REFLEX MICROSCOPIC    EKG None  Radiology DG Forearm Left  Result Date: 12/31/2022 CLINICAL DATA:  Fall, pain EXAM: LEFT FOREARM - 2 VIEW COMPARISON:  None Available. FINDINGS: Osseous structures are osteopenic. Prior ORIF of distal radius. Old healed fracture distal ulna. No acute fracture, dislocation or subluxation. No osteolytic or osteoblastic changes. IMPRESSION: Chronic posttraumatic changes.  No acute findings. Electronically Signed   By: Sammie Bench M.D.   On: 12/31/2022 18:10   DG Femur Min 2 Views Right  Result Date: 12/31/2022 CLINICAL DATA:  Fall. EXAM: DG HIP (WITH OR WITHOUT PELVIS) 2-3V RIGHT; RIGHT FEMUR 2 VIEWS; RIGHT TIBIA AND FIBULA - 2 VIEW COMPARISON:  Sacrum and coccyx radiographs 08/04/2018, right hip radiographs 08/24/2016, FINDINGS: There is diffuse decreased bone mineralization. Right hip: Mild-to-moderate bilateral superomedial femoroacetabular joint space narrowing. Minimal irregularity is unchanged related to old healed right superior pubic ramus fracture. No acute fracture or dislocation. Right femur: Severe patellofemoral joint space  narrowing and peripheral osteophytosis. Moderate medial and lateral compartment of the knee joint space narrowing and peripheral osteophytosis. Small knee joint effusion. No acute fracture is seen. No dislocation. Right tibia and fibula: Normal alignment at the ankle.  No acute fracture or dislocation. Mild-to-moderate vascular calcifications. IMPRESSION: 1. No acute fracture. 2. Severe patellofemoral and moderate medial and lateral compartment of the knee osteoarthritis. 3. 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 Electronically Signed   By: Yvonne Kendall M.D.   On: 12/31/2022 13:09   DG Tibia/Fibula Right  Result Date: 12/31/2022 CLINICAL DATA:  Fall. EXAM: DG HIP (WITH OR WITHOUT PELVIS) 2-3V RIGHT; RIGHT FEMUR 2 VIEWS; RIGHT TIBIA AND FIBULA - 2 VIEW COMPARISON:  Sacrum and coccyx radiographs 08/04/2018, right hip radiographs 08/24/2016, FINDINGS: There is diffuse decreased bone mineralization. Right hip: Mild-to-moderate bilateral superomedial femoroacetabular joint space narrowing. Minimal irregularity is unchanged related to old healed right superior pubic ramus fracture. No acute fracture or dislocation. Right femur: Severe patellofemoral joint space narrowing and peripheral osteophytosis. Moderate medial and lateral compartment of the knee joint space narrowing and peripheral osteophytosis. Small knee joint effusion. No acute fracture is seen. No dislocation. Right tibia and fibula: Normal alignment at the ankle.  No acute fracture or dislocation. Mild-to-moderate vascular calcifications. IMPRESSION: 1. No acute fracture. 2. Severe patellofemoral and moderate medial and lateral compartment of the knee osteoarthritis. 3. 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 Electronically Signed   By: Yvonne Kendall M.D.   On: 12/31/2022 13:09   DG Hip Unilat W or Wo Pelvis 2-3 Views Right  Result Date: 12/31/2022 CLINICAL DATA:  Fall. EXAM: DG HIP (WITH OR WITHOUT PELVIS) 2-3V  RIGHT; RIGHT FEMUR 2 VIEWS; RIGHT TIBIA AND FIBULA - 2 VIEW COMPARISON:  Sacrum and coccyx radiographs 08/04/2018, right hip radiographs 08/24/2016, FINDINGS: There is diffuse decreased bone mineralization. Right hip: Mild-to-moderate bilateral superomedial femoroacetabular joint space narrowing. Minimal irregularity is unchanged related to old healed right superior pubic ramus fracture. No acute fracture or dislocation. Right femur: Severe patellofemoral joint space narrowing and peripheral osteophytosis. Moderate medial and lateral compartment of the knee joint space narrowing and peripheral osteophytosis. Small knee joint effusion. No acute fracture is seen. No dislocation. Right tibia and fibula: Normal  alignment at the ankle.  No acute fracture or dislocation. Mild-to-moderate vascular calcifications. IMPRESSION: 1. No acute fracture. 2. Severe patellofemoral and moderate medial and lateral compartment of the knee osteoarthritis. 3. 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 Electronically Signed   By: Yvonne Kendall M.D.   On: 12/31/2022 13:09   CT Head Wo Contrast  Result Date: 12/31/2022 CLINICAL DATA:  Head trauma, minor (Age >= 65y) EXAM: CT HEAD WITHOUT CONTRAST TECHNIQUE: Contiguous axial images were obtained from the base of the skull through the vertex without intravenous contrast. RADIATION DOSE REDUCTION: This exam was performed according to the departmental dose-optimization program which includes automated exposure control, adjustment of the mA and/or kV according to patient size and/or use of iterative reconstruction technique. COMPARISON:  October 22, 2017 CT head. 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 Small remote infarcts in the cerebellum. No evidence of acute large vascular territory infarct, acute hemorrhage, mass lesion, midline shift or hydrocephalus. Vascular: No hyperdense  vessel.  Calcific atherosclerosis. Skull: No acute fracture. Sinuses/Orbits: Inferior right maxillary sinus mucosal thickening. No acute orbital findings. Other: No mastoid effusions. IMPRESSION: No evidence of acute intracranial abnormality. Electronically Signed   By: Margaretha Sheffield M.D.   On: 12/31/2022 12:28    Procedures Procedures    Medications Ordered in ED Medications  sulfamethoxazole-trimethoprim (BACTRIM DS) 800-160 MG per tablet 1 tablet (has no administration in time range)  acetaminophen (TYLENOL) tablet 650 mg (650 mg Oral Given 12/31/22 1158)    ED Course/ Medical Decision Making/ A&P                           Medical Decision Making Amount and/or Complexity of Data Reviewed Labs: ordered. Radiology: ordered.  Risk Prescription drug management.  This patient presents to the ED for concern of fall, this involves an extensive number of treatment options, and is a complaint that carries with it a high risk of complications and morbidity.  The differential diagnosis includes throat, weakness   Co morbidities that complicate the patient evaluation  Hypertension   Additional history obtained:  Additional history obtained from family External records from outside source obtained and reviewed including hospital records   Lab Tests:  I Ordered, and personally interpreted labs.  The pertinent results include: Urinalysis consistent with UTI, CBC and chemistries unremarkable   Imaging Studies ordered:  I ordered imaging studies including left forearm, right hip, CT head I independently visualized and interpreted imaging which showed negative for acute injuries I agree with the radiologist interpretation   Cardiac Monitoring: / EKG:  The patient was maintained on a cardiac monitor.  I personally viewed and interpreted the cardiac monitored which showed an underlying rhythm of: Normal sinus rhythm   Consultations Obtained: No consultant  Problem List / ED  Course / Critical interventions / Medication management  TIA and hypertension and fall I ordered medication including Bactrim for UTI Reevaluation of the patient after these medicines showed that the patient stayed the same I have reviewed the patients home medicines and have made adjustments as needed   Social Determinants of Health:  None   Test / Admission - Considered:  None  Patient with multiple contusions and UTI.  She is started on Bactrim.  Her family will stay with her for the next day or so and she will follow-up with her PCP        Final Clinical Impression(s) /  ED Diagnoses Final diagnoses:  Acute cystitis with hematuria    Rx / DC Orders ED Discharge Orders          Ordered    sulfamethoxazole-trimethoprim (BACTRIM DS) 800-160 MG tablet  2 times daily        12/31/22 2039              Milton Ferguson, MD 01/02/23 1138

## 2023-01-01 DIAGNOSIS — A499 Bacterial infection, unspecified: Secondary | ICD-10-CM | POA: Diagnosis not present

## 2023-01-01 DIAGNOSIS — Z9181 History of falling: Secondary | ICD-10-CM | POA: Diagnosis not present

## 2023-01-01 DIAGNOSIS — N390 Urinary tract infection, site not specified: Secondary | ICD-10-CM | POA: Diagnosis not present

## 2023-01-01 DIAGNOSIS — R531 Weakness: Secondary | ICD-10-CM | POA: Diagnosis not present

## 2023-01-02 LAB — URINE CULTURE: Culture: >=100000 — AB

## 2023-01-03 ENCOUNTER — Telehealth (HOSPITAL_BASED_OUTPATIENT_CLINIC_OR_DEPARTMENT_OTHER): Payer: Self-pay | Admitting: *Deleted

## 2023-01-03 NOTE — Telephone Encounter (Signed)
Post ED Visit - Positive Culture Follow-up  Culture report reviewed by antimicrobial stewardship pharmacist: [facility] Team '[]'$  Elenor Quinones, Pharm.D. '[]'$  Heide Guile, Pharm.D., BCPS AQ-ID '[]'$  Parks Neptune, Pharm.D., BCPS '[]'$  Alycia Rossetti, Pharm.D., BCPS '[]'$  [place], Pharm.D., BCPS, AAHIVP '[]'$  Legrand Como, Pharm.D., BCPS, AAHIVP '[]'$  Salome Arnt, PharmD, BCPS '[]'$  Johnnette Gourd, PharmD, BCPS '[]'$  Hughes Better, PharmD, BCPS '[]'$  Leeroy Cha, PharmD '[]'$  Laqueta Linden, PharmD, BCPS '[x]'$  Lorelei Pont, PharmD  [place] Team '[]'$  Leodis Sias, PharmD '[]'$  Lindell Spar, PharmD '[]'$  Royetta Asal, PharmD '[]'$  Graylin Shiver, Rph '[]'$  Rema Fendt) Glennon Mac, PharmD '[]'$  Arlyn Dunning, PharmD '[]'$  Netta Cedars, PharmD '[]'$  Dia Sitter, PharmD '[]'$  Leone Haven, PharmD '[]'$  Gretta Arab, PharmD '[]'$  Theodis Shove, PharmD '[]'$  Peggyann Juba, PharmD '[]'$  Reuel Boom, PharmD   Positive urine culture Treated with Sulfamethoxazole-Trimethoprim, organism sensitive to the same and no further patient follow-up is required at this time.  Rosie Fate 01/03/2023, 12:19 PM

## 2023-01-07 ENCOUNTER — Encounter (HOSPITAL_COMMUNITY): Payer: Self-pay | Admitting: Emergency Medicine

## 2023-01-07 ENCOUNTER — Emergency Department (HOSPITAL_COMMUNITY): Payer: Medicare Other

## 2023-01-07 ENCOUNTER — Inpatient Hospital Stay (HOSPITAL_COMMUNITY)
Admission: EM | Admit: 2023-01-07 | Discharge: 2023-01-12 | DRG: 683 | Disposition: A | Discharge status: Skilled Nursing Facility | Payer: Medicare Other | Attending: Family Medicine | Admitting: Family Medicine

## 2023-01-07 DIAGNOSIS — Z8619 Personal history of other infectious and parasitic diseases: Secondary | ICD-10-CM

## 2023-01-07 DIAGNOSIS — Z66 Do not resuscitate: Secondary | ICD-10-CM | POA: Diagnosis present

## 2023-01-07 DIAGNOSIS — M47812 Spondylosis without myelopathy or radiculopathy, cervical region: Secondary | ICD-10-CM | POA: Diagnosis not present

## 2023-01-07 DIAGNOSIS — Z8701 Personal history of pneumonia (recurrent): Secondary | ICD-10-CM | POA: Diagnosis not present

## 2023-01-07 DIAGNOSIS — Z888 Allergy status to other drugs, medicaments and biological substances status: Secondary | ICD-10-CM

## 2023-01-07 DIAGNOSIS — W19XXXA Unspecified fall, initial encounter: Secondary | ICD-10-CM

## 2023-01-07 DIAGNOSIS — Y92009 Unspecified place in unspecified non-institutional (private) residence as the place of occurrence of the external cause: Secondary | ICD-10-CM | POA: Diagnosis not present

## 2023-01-07 DIAGNOSIS — Z79899 Other long term (current) drug therapy: Secondary | ICD-10-CM

## 2023-01-07 DIAGNOSIS — I110 Hypertensive heart disease with heart failure: Secondary | ICD-10-CM | POA: Diagnosis not present

## 2023-01-07 DIAGNOSIS — Z880 Allergy status to penicillin: Secondary | ICD-10-CM

## 2023-01-07 DIAGNOSIS — Z887 Allergy status to serum and vaccine status: Secondary | ICD-10-CM

## 2023-01-07 DIAGNOSIS — M810 Age-related osteoporosis without current pathological fracture: Secondary | ICD-10-CM | POA: Diagnosis present

## 2023-01-07 DIAGNOSIS — Z823 Family history of stroke: Secondary | ICD-10-CM

## 2023-01-07 DIAGNOSIS — Z8249 Family history of ischemic heart disease and other diseases of the circulatory system: Secondary | ICD-10-CM

## 2023-01-07 DIAGNOSIS — N179 Acute kidney failure, unspecified: Principal | ICD-10-CM | POA: Diagnosis present

## 2023-01-07 DIAGNOSIS — I8390 Asymptomatic varicose veins of unspecified lower extremity: Secondary | ICD-10-CM | POA: Diagnosis present

## 2023-01-07 DIAGNOSIS — I5032 Chronic diastolic (congestive) heart failure: Secondary | ICD-10-CM | POA: Diagnosis present

## 2023-01-07 DIAGNOSIS — Y92000 Kitchen of unspecified non-institutional (private) residence as  the place of occurrence of the external cause: Secondary | ICD-10-CM

## 2023-01-07 DIAGNOSIS — N3000 Acute cystitis without hematuria: Secondary | ICD-10-CM | POA: Diagnosis present

## 2023-01-07 DIAGNOSIS — J301 Allergic rhinitis due to pollen: Secondary | ICD-10-CM | POA: Diagnosis not present

## 2023-01-07 DIAGNOSIS — N390 Urinary tract infection, site not specified: Secondary | ICD-10-CM | POA: Diagnosis not present

## 2023-01-07 DIAGNOSIS — Z7982 Long term (current) use of aspirin: Secondary | ICD-10-CM

## 2023-01-07 DIAGNOSIS — F0390 Unspecified dementia without behavioral disturbance: Secondary | ICD-10-CM | POA: Diagnosis not present

## 2023-01-07 DIAGNOSIS — W0110XA Fall on same level from slipping, tripping and stumbling with subsequent striking against unspecified object, initial encounter: Secondary | ICD-10-CM | POA: Diagnosis present

## 2023-01-07 DIAGNOSIS — Z9181 History of falling: Secondary | ICD-10-CM | POA: Diagnosis not present

## 2023-01-07 DIAGNOSIS — R739 Hyperglycemia, unspecified: Secondary | ICD-10-CM | POA: Diagnosis not present

## 2023-01-07 DIAGNOSIS — H9190 Unspecified hearing loss, unspecified ear: Secondary | ICD-10-CM | POA: Diagnosis not present

## 2023-01-07 DIAGNOSIS — R296 Repeated falls: Secondary | ICD-10-CM

## 2023-01-07 DIAGNOSIS — I639 Cerebral infarction, unspecified: Secondary | ICD-10-CM | POA: Diagnosis not present

## 2023-01-07 DIAGNOSIS — H8110 Benign paroxysmal vertigo, unspecified ear: Secondary | ICD-10-CM | POA: Diagnosis present

## 2023-01-07 DIAGNOSIS — J309 Allergic rhinitis, unspecified: Secondary | ICD-10-CM | POA: Diagnosis not present

## 2023-01-07 DIAGNOSIS — Z820 Family history of epilepsy and other diseases of the nervous system: Secondary | ICD-10-CM | POA: Diagnosis not present

## 2023-01-07 DIAGNOSIS — S199XXA Unspecified injury of neck, initial encounter: Secondary | ICD-10-CM | POA: Diagnosis not present

## 2023-01-07 DIAGNOSIS — M1990 Unspecified osteoarthritis, unspecified site: Secondary | ICD-10-CM | POA: Diagnosis present

## 2023-01-07 DIAGNOSIS — Z7401 Bed confinement status: Secondary | ICD-10-CM | POA: Diagnosis not present

## 2023-01-07 DIAGNOSIS — R627 Adult failure to thrive: Secondary | ICD-10-CM | POA: Diagnosis not present

## 2023-01-07 DIAGNOSIS — I10 Essential (primary) hypertension: Secondary | ICD-10-CM | POA: Diagnosis present

## 2023-01-07 DIAGNOSIS — Z8673 Personal history of transient ischemic attack (TIA), and cerebral infarction without residual deficits: Secondary | ICD-10-CM | POA: Diagnosis not present

## 2023-01-07 DIAGNOSIS — S0990XA Unspecified injury of head, initial encounter: Secondary | ICD-10-CM | POA: Diagnosis not present

## 2023-01-07 DIAGNOSIS — Z90710 Acquired absence of both cervix and uterus: Secondary | ICD-10-CM | POA: Diagnosis not present

## 2023-01-07 DIAGNOSIS — Z881 Allergy status to other antibiotic agents status: Secondary | ICD-10-CM

## 2023-01-07 DIAGNOSIS — R531 Weakness: Secondary | ICD-10-CM

## 2023-01-07 DIAGNOSIS — G319 Degenerative disease of nervous system, unspecified: Secondary | ICD-10-CM | POA: Diagnosis not present

## 2023-01-07 DIAGNOSIS — Z885 Allergy status to narcotic agent status: Secondary | ICD-10-CM

## 2023-01-07 DIAGNOSIS — I6782 Cerebral ischemia: Secondary | ICD-10-CM | POA: Diagnosis not present

## 2023-01-07 DIAGNOSIS — E785 Hyperlipidemia, unspecified: Secondary | ICD-10-CM | POA: Diagnosis not present

## 2023-01-07 DIAGNOSIS — E860 Dehydration: Secondary | ICD-10-CM | POA: Diagnosis not present

## 2023-01-07 DIAGNOSIS — E782 Mixed hyperlipidemia: Secondary | ICD-10-CM | POA: Diagnosis not present

## 2023-01-07 DIAGNOSIS — K219 Gastro-esophageal reflux disease without esophagitis: Secondary | ICD-10-CM | POA: Diagnosis not present

## 2023-01-07 DIAGNOSIS — I459 Conduction disorder, unspecified: Secondary | ICD-10-CM | POA: Diagnosis not present

## 2023-01-07 LAB — BASIC METABOLIC PANEL
Anion gap: 10 (ref 5–15)
BUN: 24 mg/dL — ABNORMAL HIGH (ref 8–23)
CO2: 24 mmol/L (ref 22–32)
Calcium: 9.2 mg/dL (ref 8.9–10.3)
Chloride: 102 mmol/L (ref 98–111)
Creatinine, Ser: 1.49 mg/dL — ABNORMAL HIGH (ref 0.44–1.00)
GFR, Estimated: 33 mL/min — ABNORMAL LOW (ref >60)
Glucose, Bld: 126 mg/dL — ABNORMAL HIGH (ref 70–99)
Potassium: 4.4 mmol/L (ref 3.5–5.1)
Sodium: 136 mmol/L (ref 135–145)

## 2023-01-07 LAB — CBC
HCT: 38.8 % (ref 36.0–46.0)
Hemoglobin: 12.1 g/dL (ref 12.0–15.0)
MCH: 27.6 pg (ref 26.0–34.0)
MCHC: 31.2 g/dL (ref 30.0–36.0)
MCV: 88.4 fL (ref 80.0–100.0)
Platelets: 307 10*3/uL (ref 150–400)
RBC: 4.39 MIL/uL (ref 3.87–5.11)
RDW: 13.5 % (ref 11.5–15.5)
WBC: 9.2 10*3/uL (ref 4.0–10.5)
nRBC: 0 % (ref 0.0–0.2)

## 2023-01-07 LAB — TROPONIN I (HIGH SENSITIVITY): Troponin I (High Sensitivity): 6 ng/L (ref <18)

## 2023-01-07 MED ORDER — SODIUM CHLORIDE 0.9 % IV BOLUS
500.0000 mL | Freq: Once | INTRAVENOUS | Status: AC
  Administered 2023-01-07: 500 mL via INTRAVENOUS

## 2023-01-07 NOTE — ED Triage Notes (Signed)
Patient presents from a fall tonight. She was able to crawl to the phone to call a family member to help her. She lives at home alone. From the fall, the patient suffer a laceration above the left eye and a skin tear on the left wrist. While at home, she insisted on using the bathroom while EMS was there. They noted poor ambulation and her refusal to use assistive devises. She has also suffered from diarrhea, perhaps due to the antibiotics she is taking for UTI. Based on the pills left, they believe she has missed 3 doses.    Hx: Dementia  EMS vitals: 318 CBG 138/72 BP 80 HR 95% SPO2 on room air

## 2023-01-07 NOTE — ED Provider Notes (Addendum)
  Provider Note MRN:  [id number]  Arrival date & time: 01/08/23    ED Course and Medical Decision Making  Assumed care from [facility] at shift change.  See note from prior team for complete details, in brief:  89 yo female Syncope, fall w/ head injury Abrasion to orbit no sutures Skin tear wrist  Recent uti, unclear if finished abx Cr increased from baseline, today is 1.49, prior 0.8 UA wnl CT imaging wnl She has had 2 falls in the past week, most recent today just pta Increased weakness over last 2-3 days, worse today She lives alone, uses a cane Neuro exam is non-focal Son also concerned she is developing some mild dementia, does not appear to carry a formal diagnosis of this Pt would likely benefit from short term rehab/pt eval; son at bedside agrees  Recommend admission for AKI, acute weakness, unable to ambulate safely D/w patient and son at bedside at length, agreeable to plan Spoke with [facility] who accepts pt for admission   Procedures  Final Clinical Impressions(s) / ED Diagnoses     ICD-10-CM   1. AKI (acute kidney injury) ([place])  N17.9     2. Weakness  R53.1     3. Recurrent falls  R29.6       ED Discharge Orders     None       Discharge Instructions   None        Jeanell Sparrow, DO 01/08/23 [place], [place], DO 01/08/23 0209

## 2023-01-07 NOTE — ED Provider Notes (Signed)
[place] DEPT Provider Note   CSN: [id number] Arrival date & time: 01/07/23  2054     History  Chief Complaint  Patient presents with   [name] [name] is a 89 y.o. female.   Fall  Patient presents after fall.  Not really sure what happened.  States she was in the kitchen.  Lives alone.  Has been feeling weak.  Recently treated for UTI.  Hit her head with the fall.  Denies chest pain.  No abdominal pain.  Not on blood thinners.    Past Medical History:  Diagnosis Date   Benign positional vertigo    Bursitis started 10-2011   left leg and left hip   Clostridium difficile diarrhea    hx of 10 years ago   DJD (degenerative joint disease)    Family history of adverse reaction to anesthesia    daughter has nausea   Hypertension    Leg cramps    Osteoporosis    Pneumonia    as a baby   PONV (postoperative nausea and vomiting)    Stroke ([place])    Varicose veins     Home Medications Prior to Admission medications   Medication Sig Start Date End Date Taking? Authorizing Provider  acetaminophen (TYLENOL) 325 MG tablet Take 2 tablets (650 mg total) by mouth every 6 (six) hours as needed for mild pain or moderate pain. 08/06/17   Norm Parcel, PA-C  aspirin 325 MG tablet Take 325 mg by mouth daily.    [provider]  atorvastatin (LIPITOR) 10 MG tablet Take 1 tablet (10 mg total) by mouth daily at 6 PM. Patient not taking: Reported on 08/02/2017 09/18/16   Robbie Lis, MD  calcium carbonate (TUMS EX) 750 MG chewable tablet Chew 2 tablets by mouth daily.    [provider]  Cholecalciferol (VITAMIN D-3) 5000 units TABS Take 5,000 Units by mouth daily.    [provider]  dextroamphetamine (DEXTROSTAT) 5 MG tablet Take 5 mg by mouth daily.      [provider]  docusate sodium (COLACE) 100 MG capsule Take 1 capsule (100 mg total) by mouth 2 (two) times daily. 08/06/17   Norm Parcel, PA-C   fluticasone (FLONASE) 50 MCG/ACT nasal spray Place 2 sprays into both nostrils daily.    [provider]  levocetirizine (XYZAL) 5 MG tablet Take 5 mg by mouth daily.    [provider]  lisinopril (PRINIVIL,ZESTRIL) 10 MG tablet Take 10 mg by mouth daily.    [provider]  Lutein 6 MG CAPS Take 6 mg by mouth daily.      [provider]  Magnesium 300 MG CAPS Take 300 mg by mouth daily.     [provider]  meclizine (ANTIVERT) 25 MG tablet Take 25 mg by mouth 2 (two) times daily as needed for dizziness.     [provider]  montelukast (SINGULAIR) 10 MG tablet Take 10 mg by mouth daily.    [provider]  Multiple Vitamins-Minerals (HAIR/SKIN/NAILS/BIOTIN) TABS Take 1 tablet by mouth daily.    [provider]  mupirocin ointment (BACTROBAN) 2 % Apply 1 application topically 3 (three) times daily as needed (wound care/ itching).    [provider]  oxyCODONE (OXY IR/ROXICODONE) 5 MG immediate release tablet Take 1-2 tablets (5-10 mg total) by mouth every 4 (four) hours as needed for severe pain (May use for moderate breakthrough pain). 08/06/17  Barkley Boards R, PA-C  Polyethyl Glycol-Propyl Glycol (SYSTANE OP) Place 1 drop into both eyes 3 (three) times daily.    [provider]  Probiotic Product (ALIGN) 4 MG CAPS Take 4 mg by mouth daily.    [provider]  Psyllium (METAMUCIL FIBER PO) Take 1 scoop by mouth daily as needed (constipation).    [provider]  simvastatin (ZOCOR) 10 MG tablet Take 10 mg by mouth every evening.    [provider]  sulfamethoxazole-trimethoprim (BACTRIM DS) 800-160 MG tablet Take 1 tablet by mouth 2 (two) times daily for 7 days. 12/31/22 01/07/23  Milton Ferguson, MD  triamcinolone cream (KENALOG) 0.5 % Apply 1 application topically 2 (two) times daily as needed (rash/ eczema).    [provider]  vitamin B-12 (CYANOCOBALAMIN) 1000 MCG  tablet Take 1,000 mcg by mouth daily.      [provider]      Allergies    Amoxicillin, Ciprofloxacin, Clindamycin/lincomycin, Codeine, Penicillins, Tetanus toxoids, Atorvastatin, and Ultracet [tramadol-acetaminophen]    Review of Systems   Review of Systems  Physical Exam Updated Vital Signs BP (!) 146/65 (BP Location: Right Arm)   Pulse 86   Temp 97.9 F (36.6 C) (Oral)   Resp 14   SpO2 100%  Physical Exam Vitals and nursing note reviewed.  HENT:     Head:     Comments: Abrasion in the left eyelid without frank large laceration. Cardiovascular:     Rate and Rhythm: Normal rate.  Pulmonary:     Breath sounds: No wheezing.  Abdominal:     Tenderness: There is no abdominal tenderness.  Musculoskeletal:     Cervical back: Neck supple.     Comments: Abrasion to left wrist medially.  No underlying bony tenderness.  Neurological:     Mental Status: She is alert.     ED Results / Procedures / Treatments   Labs (all labs ordered are listed, but only abnormal results are displayed) Labs Reviewed  BASIC METABOLIC PANEL - Abnormal; Notable for the following components:      Result Value   Glucose, Bld 126 (*)    BUN 24 (*)    Creatinine, Ser 1.49 (*)    GFR, Estimated 33 (*)    All other components within normal limits  CBC  URINALYSIS, ROUTINE W REFLEX MICROSCOPIC  TROPONIN I (HIGH SENSITIVITY)  TROPONIN I (HIGH SENSITIVITY)    EKG EKG Interpretation  Date/Time:  Thursday January 07 2023 21:30:54 EST Ventricular Rate:  87 PR Interval:  159 QRS Duration: 127 QT Interval:  403 QTC Calculation: 485 R Axis:   57 Text Interpretation: Sinus rhythm Nonspecific intraventricular conduction delay Confirmed by Davonna Belling [phone]) on 01/07/2023 11:18:52 PM  Radiology CT Cervical Spine Wo Contrast  Result Date: 01/07/2023 CLINICAL DATA:  Fall head trauma EXAM: CT HEAD WITHOUT CONTRAST CT CERVICAL SPINE WITHOUT CONTRAST TECHNIQUE: Multidetector CT imaging  of the head and cervical spine was performed following the standard protocol without intravenous contrast. Multiplanar CT image reconstructions of the cervical spine were also generated. RADIATION DOSE REDUCTION: This exam was performed according to the departmental dose-optimization program which includes automated exposure control, adjustment of the mA and/or kV according to patient size and/or use of iterative reconstruction technique. COMPARISON:  CT brain 12/31/2022, CT brain and cervical spine 08/02/2017 FINDINGS: CT HEAD FINDINGS Brain: No acute territorial infarction, hemorrhage or intracranial mass. Small chronic left frontal lobe infarct. Small chronic cerebellar infarcts. Atrophy. Advanced chronic small vessel ischemic  changes of the white matter. Stable ventricle size. Vascular: No hyperdense vessels.  Carotid vascular calcification. Skull: Normal. Negative for fracture or focal lesion. Sinuses/Orbits: No acute finding. Other: None CT CERVICAL SPINE FINDINGS Alignment: No subluxation.  Facet alignment within normal limits. Skull base and vertebrae: No acute fracture. No primary bone lesion or focal pathologic process. Soft tissues and spinal canal: No prevertebral fluid or swelling. No visible canal hematoma. Disc levels: Multilevel degenerative change. Mild disc space narrowing at C4-C5 and C5-C6 with moderate severe disc space narrowing C6-C7 and C7-T1. facet degenerative changes at multiple levels with foraminal narrowing Upper chest: Negative. Other: None IMPRESSION: 1. No CT evidence for acute intracranial abnormality. Atrophy and chronic small vessel ischemic changes of the white matter. Chronic left frontal and cerebellar infarcts. 2. Degenerative changes of the cervical spine. No acute osseous abnormality. Electronically Signed   By: Donavan Foil M.D.   On: 01/07/2023 21:40   CT HEAD WO CONTRAST (5MM)  Result Date: 01/07/2023 CLINICAL DATA:  Fall head trauma EXAM: CT HEAD WITHOUT CONTRAST CT  CERVICAL SPINE WITHOUT CONTRAST TECHNIQUE: Multidetector CT imaging of the head and cervical spine was performed following the standard protocol without intravenous contrast. Multiplanar CT image reconstructions of the cervical spine were also generated. RADIATION DOSE REDUCTION: This exam was performed according to the departmental dose-optimization program which includes automated exposure control, adjustment of the mA and/or kV according to patient size and/or use of iterative reconstruction technique. COMPARISON:  CT brain 12/31/2022, CT brain and cervical spine 08/02/2017 FINDINGS: CT HEAD FINDINGS Brain: No acute territorial infarction, hemorrhage or intracranial mass. Small chronic left frontal lobe infarct. Small chronic cerebellar infarcts. Atrophy. Advanced chronic small vessel ischemic changes of the white matter. Stable ventricle size. Vascular: No hyperdense vessels.  Carotid vascular calcification. Skull: Normal. Negative for fracture or focal lesion. Sinuses/Orbits: No acute finding. Other: None CT CERVICAL SPINE FINDINGS Alignment: No subluxation.  Facet alignment within normal limits. Skull base and vertebrae: No acute fracture. No primary bone lesion or focal pathologic process. Soft tissues and spinal canal: No prevertebral fluid or swelling. No visible canal hematoma. Disc levels: Multilevel degenerative change. Mild disc space narrowing at C4-C5 and C5-C6 with moderate severe disc space narrowing C6-C7 and C7-T1. facet degenerative changes at multiple levels with foraminal narrowing Upper chest: Negative. Other: None IMPRESSION: 1. No CT evidence for acute intracranial abnormality. Atrophy and chronic small vessel ischemic changes of the white matter. Chronic left frontal and cerebellar infarcts. 2. Degenerative changes of the cervical spine. No acute osseous abnormality. Electronically Signed   By: Donavan Foil M.D.   On: 01/07/2023 21:40    Procedures Procedures    Medications Ordered  in ED Medications  sodium chloride 0.9 % bolus 500 mL (500 mLs Intravenous New Bag/Given 01/07/23 2249)    ED Course/ Medical Decision Making/ A&P                           Medical Decision Making Amount and/or Complexity of Data Reviewed Labs: ordered. Radiology: ordered.   History of fall.  Hit head.  Small wound but no intracranial injury seen on head CT.  Independently interpreted by me.  However creatinine is mildly elevated.  May have a component of dehydration.  Is feeling lightheaded with attempting to stand.  Will recheck urine.  Reviewed recent ER note. Care turned over to Dr. Pearline Cables.        Final Clinical Impression(s) / ED Diagnoses  Final diagnoses:  None    Rx / DC Orders ED Discharge Orders     None         Davonna Belling, MD 01/07/23 2320

## 2023-01-07 NOTE — ED Notes (Addendum)
Pt is unable steady on their own, pt need assistance during ortho VS.

## 2023-01-08 ENCOUNTER — Encounter (HOSPITAL_COMMUNITY): Payer: Self-pay | Admitting: Internal Medicine

## 2023-01-08 DIAGNOSIS — I8390 Asymptomatic varicose veins of unspecified lower extremity: Secondary | ICD-10-CM | POA: Diagnosis present

## 2023-01-08 DIAGNOSIS — N3000 Acute cystitis without hematuria: Secondary | ICD-10-CM | POA: Diagnosis present

## 2023-01-08 DIAGNOSIS — Z8701 Personal history of pneumonia (recurrent): Secondary | ICD-10-CM | POA: Diagnosis not present

## 2023-01-08 DIAGNOSIS — R531 Weakness: Secondary | ICD-10-CM | POA: Diagnosis not present

## 2023-01-08 DIAGNOSIS — Y92000 Kitchen of unspecified non-institutional (private) residence as  the place of occurrence of the external cause: Secondary | ICD-10-CM | POA: Diagnosis not present

## 2023-01-08 DIAGNOSIS — R296 Repeated falls: Secondary | ICD-10-CM

## 2023-01-08 DIAGNOSIS — J301 Allergic rhinitis due to pollen: Secondary | ICD-10-CM | POA: Diagnosis not present

## 2023-01-08 DIAGNOSIS — N179 Acute kidney failure, unspecified: Secondary | ICD-10-CM | POA: Diagnosis not present

## 2023-01-08 DIAGNOSIS — R627 Adult failure to thrive: Secondary | ICD-10-CM | POA: Diagnosis present

## 2023-01-08 DIAGNOSIS — Z823 Family history of stroke: Secondary | ICD-10-CM | POA: Diagnosis not present

## 2023-01-08 DIAGNOSIS — W19XXXA Unspecified fall, initial encounter: Secondary | ICD-10-CM | POA: Diagnosis not present

## 2023-01-08 DIAGNOSIS — I5032 Chronic diastolic (congestive) heart failure: Secondary | ICD-10-CM | POA: Diagnosis present

## 2023-01-08 DIAGNOSIS — H8110 Benign paroxysmal vertigo, unspecified ear: Secondary | ICD-10-CM | POA: Diagnosis present

## 2023-01-08 DIAGNOSIS — M810 Age-related osteoporosis without current pathological fracture: Secondary | ICD-10-CM | POA: Diagnosis present

## 2023-01-08 DIAGNOSIS — Z66 Do not resuscitate: Secondary | ICD-10-CM | POA: Diagnosis present

## 2023-01-08 DIAGNOSIS — H9190 Unspecified hearing loss, unspecified ear: Secondary | ICD-10-CM | POA: Diagnosis present

## 2023-01-08 DIAGNOSIS — Z90710 Acquired absence of both cervix and uterus: Secondary | ICD-10-CM | POA: Diagnosis not present

## 2023-01-08 DIAGNOSIS — Z820 Family history of epilepsy and other diseases of the nervous system: Secondary | ICD-10-CM | POA: Diagnosis not present

## 2023-01-08 DIAGNOSIS — Z8619 Personal history of other infectious and parasitic diseases: Secondary | ICD-10-CM | POA: Diagnosis not present

## 2023-01-08 DIAGNOSIS — Y92009 Unspecified place in unspecified non-institutional (private) residence as the place of occurrence of the external cause: Secondary | ICD-10-CM

## 2023-01-08 DIAGNOSIS — I459 Conduction disorder, unspecified: Secondary | ICD-10-CM | POA: Diagnosis present

## 2023-01-08 DIAGNOSIS — M1990 Unspecified osteoarthritis, unspecified site: Secondary | ICD-10-CM | POA: Diagnosis present

## 2023-01-08 DIAGNOSIS — J309 Allergic rhinitis, unspecified: Secondary | ICD-10-CM | POA: Diagnosis present

## 2023-01-08 DIAGNOSIS — E782 Mixed hyperlipidemia: Secondary | ICD-10-CM

## 2023-01-08 DIAGNOSIS — I10 Essential (primary) hypertension: Secondary | ICD-10-CM

## 2023-01-08 DIAGNOSIS — Z9181 History of falling: Secondary | ICD-10-CM | POA: Diagnosis not present

## 2023-01-08 DIAGNOSIS — E785 Hyperlipidemia, unspecified: Secondary | ICD-10-CM | POA: Diagnosis present

## 2023-01-08 DIAGNOSIS — F0390 Unspecified dementia without behavioral disturbance: Secondary | ICD-10-CM | POA: Diagnosis present

## 2023-01-08 DIAGNOSIS — Z8249 Family history of ischemic heart disease and other diseases of the circulatory system: Secondary | ICD-10-CM | POA: Diagnosis not present

## 2023-01-08 DIAGNOSIS — W0110XA Fall on same level from slipping, tripping and stumbling with subsequent striking against unspecified object, initial encounter: Secondary | ICD-10-CM | POA: Diagnosis present

## 2023-01-08 DIAGNOSIS — E860 Dehydration: Secondary | ICD-10-CM | POA: Diagnosis present

## 2023-01-08 DIAGNOSIS — I110 Hypertensive heart disease with heart failure: Secondary | ICD-10-CM | POA: Diagnosis present

## 2023-01-08 DIAGNOSIS — Z8673 Personal history of transient ischemic attack (TIA), and cerebral infarction without residual deficits: Secondary | ICD-10-CM | POA: Diagnosis not present

## 2023-01-08 LAB — URINALYSIS, ROUTINE W REFLEX MICROSCOPIC
Bilirubin Urine: NEGATIVE
Glucose, UA: NEGATIVE mg/dL
Hgb urine dipstick: NEGATIVE
Ketones, ur: 5 mg/dL — AB
Leukocytes,Ua: NEGATIVE
Nitrite: NEGATIVE
Protein, ur: NEGATIVE mg/dL
Specific Gravity, Urine: 1.014 (ref 1.005–1.030)
pH: 7 (ref 5.0–8.0)

## 2023-01-08 LAB — CBC WITH DIFFERENTIAL/PLATELET
Abs Immature Granulocytes: 0.02 10*3/uL (ref 0.00–0.07)
Basophils Absolute: 0.1 10*3/uL (ref 0.0–0.1)
Basophils Relative: 1 %
Eosinophils Absolute: 0.1 10*3/uL (ref 0.0–0.5)
Eosinophils Relative: 2 %
HCT: 37.5 % (ref 36.0–46.0)
Hemoglobin: 11.7 g/dL — ABNORMAL LOW (ref 12.0–15.0)
Immature Granulocytes: 0 %
Lymphocytes Relative: 23 %
Lymphs Abs: 1.7 10*3/uL (ref 0.7–4.0)
MCH: 27.7 pg (ref 26.0–34.0)
MCHC: 31.2 g/dL (ref 30.0–36.0)
MCV: 88.9 fL (ref 80.0–100.0)
Monocytes Absolute: 0.9 10*3/uL (ref 0.1–1.0)
Monocytes Relative: 12 %
Neutro Abs: 4.6 10*3/uL (ref 1.7–7.7)
Neutrophils Relative %: 62 %
Platelets: 290 10*3/uL (ref 150–400)
RBC: 4.22 MIL/uL (ref 3.87–5.11)
RDW: 13.6 % (ref 11.5–15.5)
WBC: 7.4 10*3/uL (ref 4.0–10.5)
nRBC: 0 % (ref 0.0–0.2)

## 2023-01-08 LAB — COMPREHENSIVE METABOLIC PANEL
ALT: 19 U/L (ref 0–44)
AST: 32 U/L (ref 15–41)
Albumin: 3.4 g/dL — ABNORMAL LOW (ref 3.5–5.0)
Alkaline Phosphatase: 33 U/L — ABNORMAL LOW (ref 38–126)
Anion gap: 7 (ref 5–15)
BUN: 23 mg/dL (ref 8–23)
CO2: 22 mmol/L (ref 22–32)
Calcium: 8.6 mg/dL — ABNORMAL LOW (ref 8.9–10.3)
Chloride: 106 mmol/L (ref 98–111)
Creatinine, Ser: 1.33 mg/dL — ABNORMAL HIGH (ref 0.44–1.00)
GFR, Estimated: 38 mL/min — ABNORMAL LOW (ref >60)
Glucose, Bld: 100 mg/dL — ABNORMAL HIGH (ref 70–99)
Potassium: 4.3 mmol/L (ref 3.5–5.1)
Sodium: 135 mmol/L (ref 135–145)
Total Bilirubin: 0.4 mg/dL (ref 0.3–1.2)
Total Protein: 6.5 g/dL (ref 6.5–8.1)

## 2023-01-08 LAB — TROPONIN I (HIGH SENSITIVITY): Troponin I (High Sensitivity): 6 ng/L (ref <18)

## 2023-01-08 LAB — CK: Total CK: 172 U/L (ref 38–234)

## 2023-01-08 LAB — CREATININE, URINE, RANDOM: Creatinine, Urine: 79 mg/dL

## 2023-01-08 LAB — SODIUM, URINE, RANDOM: Sodium, Ur: 154 mmol/L

## 2023-01-08 LAB — TSH: TSH: 1.675 u[IU]/mL (ref 0.350–4.500)

## 2023-01-08 LAB — MAGNESIUM: Magnesium: 2 mg/dL (ref 1.7–2.4)

## 2023-01-08 MED ORDER — ACETAMINOPHEN 325 MG PO TABS
650.0000 mg | ORAL_TABLET | Freq: Four times a day (QID) | ORAL | Status: DC | PRN
  Administered 2023-01-09 – 2023-01-10 (×2): 650 mg via ORAL
  Filled 2023-01-08 (×2): qty 2

## 2023-01-08 MED ORDER — SODIUM CHLORIDE 0.9 % IV SOLN: INTRAVENOUS | Status: DC

## 2023-01-08 MED ORDER — SODIUM CHLORIDE 0.9 % IV SOLN: Freq: Once | INTRAVENOUS | Status: AC

## 2023-01-08 MED ORDER — SODIUM CHLORIDE 0.9 % IV SOLN: INTRAVENOUS | Status: AC

## 2023-01-08 MED ORDER — LEVOCETIRIZINE DIHYDROCHLORIDE 5 MG PO TABS: 5.0000 mg | ORAL_TABLET | Freq: Every day | ORAL | Status: DC

## 2023-01-08 MED ORDER — MELATONIN 3 MG PO TABS
3.0000 mg | ORAL_TABLET | Freq: Every evening | ORAL | Status: DC | PRN
  Administered 2023-01-08 – 2023-01-10 (×3): 3 mg via ORAL
  Filled 2023-01-08 (×3): qty 1

## 2023-01-08 MED ORDER — ACETAMINOPHEN 650 MG RE SUPP: 650.0000 mg | Freq: Four times a day (QID) | RECTAL | Status: DC | PRN

## 2023-01-08 MED ORDER — MONTELUKAST SODIUM 10 MG PO TABS
10.0000 mg | ORAL_TABLET | Freq: Every day | ORAL | Status: DC
  Administered 2023-01-08 – 2023-01-12 (×5): 10 mg via ORAL
  Filled 2023-01-08 (×5): qty 1

## 2023-01-08 MED ORDER — DOCUSATE SODIUM 100 MG PO CAPS
100.0000 mg | ORAL_CAPSULE | Freq: Two times a day (BID) | ORAL | Status: DC
  Administered 2023-01-08 – 2023-01-12 (×6): 100 mg via ORAL
  Filled 2023-01-08 (×9): qty 1

## 2023-01-08 MED ORDER — FLUTICASONE PROPIONATE 50 MCG/ACT NA SUSP
2.0000 | Freq: Every day | NASAL | Status: DC
  Administered 2023-01-08 – 2023-01-09 (×2): 2 via NASAL
  Filled 2023-01-08: qty 16

## 2023-01-08 MED ORDER — LORATADINE 10 MG PO TABS
10.0000 mg | ORAL_TABLET | Freq: Every day | ORAL | Status: DC
  Administered 2023-01-08 – 2023-01-12 (×5): 10 mg via ORAL
  Filled 2023-01-08 (×5): qty 1

## 2023-01-08 NOTE — Evaluation (Signed)
Occupational Therapy Evaluation Patient Details Name: [name] MRN: [id number] DOB: March 26, 1928 Today's Date: 01/08/2023   History of Present Illness [name] is a 89 y.o. female with medical history significant for essential hypertension, hyperlipidemia, chronic diastolic heart failure, vertigo, CVA, who is admitted to [facility] on 01/07/2023 with generalized weakness, fall.   Clinical Impression   [name] is a 89 year old woman who lives alone and is independent with a cane and now presents s/p fall at home. She presents with impaired and decreased activity tolerance with reports of pain in left knee. On evaluation she did well with bed mobility, able to ambulate into hall with walker though reports she does better with her cane. She required some assistance for LB ADLs and toileting - but suspect she was impaired by hospital environment and no specific physical deficits. She exhibited good upper body strength. Recommend patient have increased family support at home initially and [facility] services. Will follow patient acutely.     Recommendations for follow up therapy are one component of a multi-disciplinary discharge planning process, led by the attending physician.  Recommendations may be updated based on patient status, additional functional criteria and insurance authorization.   Follow Up Recommendations  Home health OT     Assistance Recommended at Discharge Intermittent Supervision/Assistance  Patient can return home with the following Assistance with cooking/housework;Help with stairs or ramp for entrance;Direct supervision/assist for medications management    Functional Status Assessment  Patient has had a recent decline in their functional status and demonstrates the ability to make significant improvements in function in a reasonable and predictable amount of time.  Equipment Recommendations  None recommended by OT     Recommendations for Other Services       Precautions / Restrictions Precautions Precautions: Fall Restrictions Weight Bearing Restrictions: No      Mobility Bed Mobility Overal bed mobility: Needs Assistance Bed Mobility: Supine to Sit, Sit to Supine     Supine to sit: Min guard Sit to supine: Min guard   General bed mobility comments: patient moves self to sitting and return  to supine    Transfers Overall transfer level: Needs assistance Equipment used: Rolling walker (2 wheels) Transfers: Sit to/from Stand, Bed to chair/wheelchair/BSC Sit to Stand: Min assist     Step pivot transfers: Min assist     General transfer comment: steady assist to rise from stretcher and BSc, steps to  [facility] using RW      Balance Overall balance assessment: Mild deficits observed, not formally tested, History of Falls                                         ADL either performed or assessed with clinical judgement   ADL Overall ADL's : Needs assistance/impaired Eating/Feeding: Independent   Grooming: Set up;Sitting   Upper Body Bathing: Set up;Sitting   Lower Body Bathing: Minimal assistance;Sit to/from stand   Upper Body Dressing : Set up;Sitting   Lower Body Dressing: Minimal assistance;Sit to/from stand   Toilet Transfer: Minimal assistance;BSC/3in1   Toileting- Water quality scientist and Hygiene: Moderate assistance;Sit to/from stand       Functional mobility during ADLs: Minimal assistance       Vision Patient Visual Report: No change from baseline       Perception     Praxis      Pertinent  Vitals/Pain Pain Assessment Faces Pain Scale: Hurts a little bit Pain Location: generalized, Pain Descriptors / Indicators: Discomfort Pain Intervention(s): Monitored during session     Hand Dominance Right   Extremity/Trunk Assessment Upper Extremity Assessment Upper Extremity Assessment: Overall WFL for tasks assessed (5/5 Upper body  strength)   Lower Extremity Assessment Lower Extremity Assessment: Defer to PT evaluation   Cervical / Trunk Assessment Cervical / Trunk Assessment: Kyphotic   Communication Communication Communication: No difficulties   Cognition Arousal/Alertness: Awake/alert Behavior During Therapy: WFL for tasks assessed/performed Overall Cognitive Status: Within Functional Limits for tasks assessed                                       General Comments       Exercises     Shoulder Instructions      Home Living Family/patient expects to be discharged to:: Private residence Living Arrangements: Alone Available Help at Discharge: Available PRN/intermittently Type of Home: House Home Access: Stairs to enter [employer] of Steps: 2 Entrance Stairs-Rails: None Home Layout: Two level;Able to live on main level with bedroom/bathroom     Bathroom Shower/Tub: Walk-in shower;Tub/shower unit   [employer]: Standard     Home Equipment: Civil engineer, contracting - built in;Hand held Veterinary surgeon - single point   Additional Comments: says she has an "old walker"      Prior Functioning/Environment Prior Level of Function : Independent/Modified Independent             Mobility Comments: cane ADLs Comments: independent        OT Problem List: Impaired balance (sitting and/or standing);Pain      OT Treatment/Interventions: Self-care/ADL training;Therapeutic exercise;DME and/or AE instruction;Therapeutic activities;Balance training;Patient/family education    OT Goals(Current goals can be found in the care plan section) Acute Rehab OT Goals Patient Stated Goal: did not state OT Goal Formulation: With patient Time For Goal Achievement: 01/22/23 Potential to Achieve Goals: Good  OT Frequency: Min 2X/week    Co-evaluation              AM-PAC OT "6 Clicks" Daily Activity     Outcome Measure Help from another person eating meals?: None Help from  another person taking care of personal grooming?: A Little Help from another person toileting, which includes using toliet, bedpan, or urinal?: A Lot Help from another person bathing (including washing, rinsing, drying)?: A Little Help from another person to put on and taking off regular upper body clothing?: A Little Help from another person to put on and taking off regular lower body clothing?: A Little 6 Click Score: 18   End of Session Equipment Utilized During Treatment: Rolling walker (2 wheels) Nurse Communication: Mobility status  Activity Tolerance: Patient tolerated treatment well Patient left: in bed;with call bell/phone within reach  OT Visit Diagnosis: History of falling (Z91.81)                Time: 1607-3710 OT Time Calculation (min): 17 min Charges:  OT General Charges $OT Visit: 1 Visit OT Evaluation $OT Eval Low Complexity: 1 Low  Gustavo Lah, OTR/L [place]  Office [phone]   Lenward Chancellor 01/08/2023, 1:11 PM

## 2023-01-08 NOTE — H&P (Addendum)
History and Physical      [name] QQV:[id number] DOB: [birth year] DOA: 01/07/2023  PCP: Corliss Blacker, MD  Patient coming from: home   I have personally briefly reviewed patient's old medical records in [facility]  Chief Complaint: Generalized weakness  HPI: [name] is a 89 y.o. female with medical history significant for essential hypertension, hyperlipidemia, chronic diastolic heart failure, who is admitted to [facility] on 01/07/2023 with generalized weakness after presenting from home to [facility] ED complaining of generalized weakness.   The patient reports 3 to 4 days generalized weakness in the absence of any acute focal weakness nor any acute focal numbness/paresthesias.  As a consequence of this generalized weakness, she is experienced to ground-level mechanical falls over the last 4 days, in which that she tripped while trying to ambulate at home, where she lives by herself and is able to ambulate without assistance, aside from a cane, at baseline.  Neither fall was assisted with any loss of consciousness, but she does report that she hit the side of her head as a component of the more recent fall which occurred earlier on 01/07/2023.  Denies any associated Preceding, or ensuing chest pain, shortness of breath, palpitations, diaphoresis, nausea, vomiting, dizziness, syncope, or syncope.  She notes that she is on a daily full dose aspirin at home, but otherwise no additional blood thinners at home.  She recently completed a 7-day course of Bactrim for acute cystitis, with duration of antibiotic therapy from 12/31/2018 4-1 11 4.  Urine culture collected on 12/31/2022 grew greater than 100,000 colonies of Staph aureus, which was sensitive to Bactrim.  While on Bactrim, she reports interval resolution of her preceding dysuria, noting no residual dysuria this time nor any new gross hematuria, or change in urinary urgency/frequency.  Following completion of  Bactrim regimen, denies any residual subjective fever, chills, rigors, generalized myalgias.  No recent cough, rash, abdominal pain.   Note, the patient's son conveys that the patient has some chronic memory loss, and notes that her presenting mental status is consistent with that associated with her baseline.    ED Course:  Vital signs in the ED were notable for the following: Afebrile; heart rate 33-29; systolic pressures in the 130s to 140s; respiratory rate 14-24, oxygen saturation 96% on room air.  Labs were notable for the following: BMP notable for sodium 36, bicarbonate 24, BUN 24 compared to a 16 on creatinine 1.49 her serum creatinine 0.86 424, glucose 126.  CBC notable for what was will count 9200.  Urinalysis collected in the ED today, no white blood cells, leukocyte esterase/nitrate negative, no RBCs, also demonstrating 5 ketones.  Per my interpretation, EKG in ED demonstrated the following: Sinus rhythm with nonspecific intraventricular conduction delay with heart rate 87, no evidence of T wave or ST changes, including no evidence of ST elevation.  Imaging and additional notable ED work-up: CT head showed no evidence of acute intracranial process, who noted to have intracranial.  Evidence of acute process, including acute infarct.  CT cervical spine, formal radiology read shows no evidence of acute cervical spine fracture or subluxation injury.  While in the ED, the following were administered: Normal saline to his 100 cc bolus followed by continuous NS running at 100 cc/h.  Subsequently, the patient was admitted for further evaluation management of presenting generalized weakness, with presenting labs notable for acute kidney injury.    Review of Systems: As per HPI otherwise 10 point review  of systems negative.   Past Medical History:  Diagnosis Date   Benign positional vertigo    Bursitis started 10-2011   left leg and left hip   Clostridium difficile diarrhea    hx of  10 years ago   DJD (degenerative joint disease)    Family history of adverse reaction to anesthesia    daughter has nausea   Hypertension    Leg cramps    Osteoporosis    Pneumonia    as a baby   PONV (postoperative nausea and vomiting)    Stroke ([place])    Varicose veins     Past Surgical History:  Procedure Laterality Date   ABDOMINAL HYSTERECTOMY     APPENDECTOMY     CATARACT EXTRACTION     right   COLONOSCOPY     HERNIA REPAIR     KYPHOSIS SURGERY     ORIF WRIST FRACTURE Left 12/24/2016   Procedure: OPEN REDUCTION INTERNAL FIXATION (ORIF) LEFT WRIST FRACTURE;  Surgeon: Roseanne Kaufman, MD;  Location: [place];  Service: Orthopedics;  Laterality: Left;   TUBAL LIGATION     bilateral    Social History:  reports that she has never smoked. She has never used smokeless tobacco. She reports that she does not drink alcohol and does not use drugs.   Allergies  Allergen Reactions   Amoxicillin Diarrhea, Nausea And Vomiting and Other (See Comments)    c-diff from antibiotics 2009   Ciprofloxacin Diarrhea, Nausea And Vomiting and Other (See Comments)    c-diff from antibiotics 2009   Clindamycin/Lincomycin Diarrhea, Nausea And Vomiting and Other (See Comments)    c-diff from antiobiotics 2009   Codeine Nausea And Vomiting   Penicillins Hives, Swelling, Rash and Other (See Comments)    Has patient had a PCN reaction causing immediate rash, facial/tongue/throat swelling, SOB or lightheadedness with hypotension: swelling of arm, and rash Has patient had a PCN reaction causing severe rash involving mucus membranes or skin necrosis: unknown Has patient had a PCN reaction that required hospitalization : unknown Has patient had a PCN reaction occurring within the last 10 years: no If all of the above answers are "NO", then may proceed with Cephalosporin use.    Tetanus Toxoids Hives    Reaction many years ago   Atorvastatin Diarrhea   Ultracet [Tramadol-Acetaminophen] Nausea And  Vomiting    Family History  Problem Relation Age of Onset   Alzheimer's disease Mother    Stroke Mother    Coronary artery disease Father    Ulcers Father     Family history reviewed and not pertinent    Prior to Admission medications   Medication Sig Start Date End Date Taking? Authorizing Provider  acetaminophen (TYLENOL) 325 MG tablet Take 2 tablets (650 mg total) by mouth every 6 (six) hours as needed for mild pain or moderate pain. 08/06/17   Norm Parcel, PA-C  aspirin 325 MG tablet Take 325 mg by mouth daily.    [provider]  atorvastatin (LIPITOR) 10 MG tablet Take 1 tablet (10 mg total) by mouth daily at 6 PM. Patient not taking: Reported on 08/02/2017 09/18/16   Robbie Lis, MD  calcium carbonate (TUMS EX) 750 MG chewable tablet Chew 2 tablets by mouth daily.    [provider]  Cholecalciferol (VITAMIN D-3) 5000 units TABS Take 5,000 Units by mouth daily.    [provider]  dextroamphetamine (DEXTROSTAT) 5 MG tablet Take 5 mg by mouth daily.  [provider]  docusate sodium (COLACE) 100 MG capsule Take 1 capsule (100 mg total) by mouth 2 (two) times daily. 08/06/17   Norm Parcel, PA-C  fluticasone (FLONASE) 50 MCG/ACT nasal spray Place 2 sprays into both nostrils daily.    [provider]  levocetirizine (XYZAL) 5 MG tablet Take 5 mg by mouth daily.    [provider]  lisinopril (PRINIVIL,ZESTRIL) 10 MG tablet Take 10 mg by mouth daily.    [provider]  Lutein 6 MG CAPS Take 6 mg by mouth daily.      [provider]  Magnesium 300 MG CAPS Take 300 mg by mouth daily.     [provider]  meclizine (ANTIVERT) 25 MG tablet Take 25 mg by mouth 2 (two) times daily as needed for dizziness.     [provider]  montelukast (SINGULAIR) 10 MG tablet Take 10 mg by mouth daily.    [provider]  Multiple Vitamins-Minerals (HAIR/SKIN/NAILS/BIOTIN) TABS Take 1 tablet  by mouth daily.    [provider]  mupirocin ointment (BACTROBAN) 2 % Apply 1 application topically 3 (three) times daily as needed (wound care/ itching).    [provider]  oxyCODONE (OXY IR/ROXICODONE) 5 MG immediate release tablet Take 1-2 tablets (5-10 mg total) by mouth every 4 (four) hours as needed for severe pain (May use for moderate breakthrough pain). 08/06/17   Norm Parcel, PA-C  Polyethyl Glycol-Propyl Glycol (SYSTANE OP) Place 1 drop into both eyes 3 (three) times daily.    [provider]  Probiotic Product (ALIGN) 4 MG CAPS Take 4 mg by mouth daily.    [provider]  Psyllium (METAMUCIL FIBER PO) Take 1 scoop by mouth daily as needed (constipation).    [provider]  simvastatin (ZOCOR) 10 MG tablet Take 10 mg by mouth every evening.    [provider]  triamcinolone cream (KENALOG) 0.5 % Apply 1 application topically 2 (two) times daily as needed (rash/ eczema).    [provider]  vitamin B-12 (CYANOCOBALAMIN) 1000 MCG tablet Take 1,000 mcg by mouth daily.      [provider]     Objective    Physical Exam: Vitals:   01/07/23 2103 01/08/23 0000 01/08/23 0045 01/08/23 0130  BP: (!) 146/65 139/72 (!) 142/66 (!) 164/127  Pulse: 86 84 78 84  Resp: '14 16 20 '$ (!) 24  Temp: 97.9 F (36.6 C) 97.9 F (36.6 C)    TempSrc: Oral     SpO2: 100% 96% 98% 100%    General: appears to be stated age; alert, oriented Skin: warm, dry, no rash Head:  AT/[place] Mouth:  Oral mucosa membranes appear dry, normal dentition Neck: supple; trachea midline Heart:  RRR; did not appreciate any M/R/G Lungs: CTAB, did not appreciate any wheezes, rales, or rhonchi Abdomen: + BS; soft, ND, NT Vascular: 2+ pedal pulses b/l; 2+ radial pulses b/l Extremities: no peripheral edema, no muscle wasting Neuro: strength and sensation intact in upper and lower extremities b/l     Labs on Admission: I have personally reviewed  following labs and imaging studies  CBC: Recent Labs  Lab 01/07/23 2130  WBC 9.2  HGB 12.1  HCT 38.8  MCV 88.4  PLT 921   Basic Metabolic Panel: Recent Labs  Lab 01/07/23 2130  NA 136  K 4.4  CL 102  CO2 24  GLUCOSE 126*  BUN 24*  CREATININE 1.49*  CALCIUM 9.2  GFR: CrCl cannot be calculated (Unknown ideal weight.). Liver Function Tests: No results for input(s): "AST", "ALT", "ALKPHOS", "BILITOT", "PROT", "ALBUMIN" in the last 168 hours. No results for input(s): "LIPASE", "AMYLASE" in the last 168 hours. No results for input(s): "AMMONIA" in the last 168 hours. Coagulation Profile: No results for input(s): "INR", "PROTIME" in the last 168 hours. Cardiac Enzymes: No results for input(s): "CKTOTAL", "CKMB", "CKMBINDEX", "TROPONINI" in the last 168 hours. BNP (last 3 results) No results for input(s): "PROBNP" in the last 8760 hours. HbA1C: No results for input(s): "HGBA1C" in the last 72 hours. CBG: No results for input(s): "GLUCAP" in the last 168 hours. Lipid Profile: No results for input(s): "CHOL", "HDL", "LDLCALC", "TRIG", "CHOLHDL", "LDLDIRECT" in the last 72 hours. Thyroid Function Tests: No results for input(s): "TSH", "T4TOTAL", "FREET4", "T3FREE", "THYROIDAB" in the last 72 hours. Anemia Panel: No results for input(s): "VITAMINB12", "FOLATE", "FERRITIN", "TIBC", "IRON", "RETICCTPCT" in the last 72 hours. Urine analysis:    Component Value Date/Time   COLORURINE YELLOW 01/08/2023 0100   APPEARANCEUR CLEAR 01/08/2023 0100   LABSPEC 1.014 01/08/2023 0100   PHURINE 7.0 01/08/2023 0100   GLUCOSEU NEGATIVE 01/08/2023 0100   HGBUR NEGATIVE 01/08/2023 0100   BILIRUBINUR NEGATIVE 01/08/2023 0100   KETONESUR 5 (A) 01/08/2023 0100   PROTEINUR NEGATIVE 01/08/2023 0100   UROBILINOGEN 0.2 10/15/2007 1211   NITRITE NEGATIVE 01/08/2023 0100   LEUKOCYTESUR NEGATIVE 01/08/2023 0100    Radiological Exams on Admission: CT Cervical Spine Wo Contrast  Result Date:  01/07/2023 CLINICAL DATA:  Fall head trauma EXAM: CT HEAD WITHOUT CONTRAST CT CERVICAL SPINE WITHOUT CONTRAST TECHNIQUE: Multidetector CT imaging of the head and cervical spine was performed following the standard protocol without intravenous contrast. Multiplanar CT image reconstructions of the cervical spine were also generated. RADIATION DOSE REDUCTION: This exam was performed according to the departmental dose-optimization program which includes automated exposure control, adjustment of the mA and/or kV according to patient size and/or use of iterative reconstruction technique. COMPARISON:  CT brain 12/31/2022, CT brain and cervical spine 08/02/2017 FINDINGS: CT HEAD FINDINGS Brain: No acute territorial infarction, hemorrhage or intracranial mass. Small chronic left frontal lobe infarct. Small chronic cerebellar infarcts. Atrophy. Advanced chronic small vessel ischemic changes of the white matter. Stable ventricle size. Vascular: No hyperdense vessels.  Carotid vascular calcification. Skull: Normal. Negative for fracture or focal lesion. Sinuses/Orbits: No acute finding. Other: None CT CERVICAL SPINE FINDINGS Alignment: No subluxation.  Facet alignment within normal limits. Skull base and vertebrae: No acute fracture. No primary bone lesion or focal pathologic process. Soft tissues and spinal canal: No prevertebral fluid or swelling. No visible canal hematoma. Disc levels: Multilevel degenerative change. Mild disc space narrowing at C4-C5 and C5-C6 with moderate severe disc space narrowing C6-C7 and C7-T1. facet degenerative changes at multiple levels with foraminal narrowing Upper chest: Negative. Other: None IMPRESSION: 1. No CT evidence for acute intracranial abnormality. Atrophy and chronic small vessel ischemic changes of the white matter. Chronic left frontal and cerebellar infarcts. 2. Degenerative changes of the cervical spine. No acute osseous abnormality. Electronically Signed   By: Donavan Foil M.D.    On: 01/07/2023 21:40   CT HEAD WO CONTRAST (5MM)  Result Date: 01/07/2023 CLINICAL DATA:  Fall head trauma EXAM: CT HEAD WITHOUT CONTRAST CT CERVICAL SPINE WITHOUT CONTRAST TECHNIQUE: Multidetector CT imaging of the head and cervical spine was performed following the standard protocol without intravenous contrast. Multiplanar CT image reconstructions of the cervical spine were also generated. RADIATION DOSE REDUCTION:  This exam was performed according to the departmental dose-optimization program which includes automated exposure control, adjustment of the mA and/or kV according to patient size and/or use of iterative reconstruction technique. COMPARISON:  CT brain 12/31/2022, CT brain and cervical spine 08/02/2017 FINDINGS: CT HEAD FINDINGS Brain: No acute territorial infarction, hemorrhage or intracranial mass. Small chronic left frontal lobe infarct. Small chronic cerebellar infarcts. Atrophy. Advanced chronic small vessel ischemic changes of the white matter. Stable ventricle size. Vascular: No hyperdense vessels.  Carotid vascular calcification. Skull: Normal. Negative for fracture or focal lesion. Sinuses/Orbits: No acute finding. Other: None CT CERVICAL SPINE FINDINGS Alignment: No subluxation.  Facet alignment within normal limits. Skull base and vertebrae: No acute fracture. No primary bone lesion or focal pathologic process. Soft tissues and spinal canal: No prevertebral fluid or swelling. No visible canal hematoma. Disc levels: Multilevel degenerative change. Mild disc space narrowing at C4-C5 and C5-C6 with moderate severe disc space narrowing C6-C7 and C7-T1. facet degenerative changes at multiple levels with foraminal narrowing Upper chest: Negative. Other: None IMPRESSION: 1. No CT evidence for acute intracranial abnormality. Atrophy and chronic small vessel ischemic changes of the white matter. Chronic left frontal and cerebellar infarcts. 2. Degenerative changes of the cervical spine. No acute  osseous abnormality. Electronically Signed   By: Donavan Foil M.D.   On: 01/07/2023 21:40      Assessment/Plan    Principal Problem:   Generalized weakness Active Problems:   Essential hypertension   Hyperlipidemia   AKI (acute kidney injury) ([place])   Fall at home, initial encounter   Chronic diastolic CHF (congestive heart failure) (H[facility])   Allergic rhinitis     # Generalized weakness generalized weakness in the absence of any acute focal neurologic deficits to suggest acute CVA, while CT head shows no evidence of acute intracranial process, as further detailed above.  Suspect that her generalized weakness is multifactorial in nature, with contribution from acute kidney injury as a result of recent Bactrim use resulting in increase in buildup of toxic metabolites associated with renally cleared.  No evidence of residual urinary tract infection following aforementioned completed course of Bactrim, and no clinical evidence to suggest additional underlying infectious process at this time.  However, given generalized weakness and acute kidney injury We will on a statin at home, will also check CPK level.  Her generalized weakness and buildup of toxic metabolites of oxycodone, appear to patient's multiple ground-level mechanical falls over the course the last few few days, without any associated acute findings on CT head.   Plan: Further evaluation management acute injury, bleeding gentle IV fluids overnight.  Will precautions ordered.  Physical therapy/Occupational Therapy consults placed through the morning and on CPK level.  Check TSH.  Repeat CMP/CBC in the morning.  Add on serum magnesium level.  Fall precautions ordered.          #) Acute Kidney Injury:  as quantified above, with likely contribution from interval use of Bactrim, serving as a potassium sparing diuretic, with predisposition of the Bactrim to reduce excretion of itself relative to serum creatinine.  Potential for  additional pharmacologic exacerbation in the setting of outpatient lisinopril as well as increased risk for contribution from toxic metabolites of oxycodone given diminished renal clearance thereof resulting in aforementioned buildup of toxic metabolites.  Presenting urinalysis shows 5 ketones, suggestive of an element of dehydration diminished oral intake and We will Guiatuss does not demonstrate any significant urinary casts, including no white blood cells or red blood  cell casts.  Plan: Continue existing order for normal saline at 100 cc/h. monitor strict I's & O's and daily weights. Attempt to avoid nephrotoxic agents. Refrain from NSAIDs. Repeat CMP in the morning. Check serum magnesium level. Add-on random urine sodium and random urine creatinine.  Hold home lisinopril, oxycodone. if renal function does not improve with the above measures, can consider obtaining a renal US to evaluate for parenchymal abnormality as well as to assess for evidence of post-renal obstructive process.               #) Essential Hypertension: documented h/o such, with outpatient antihypertensive regimen including lisinopril, which is notable in the setting of presenting acute kidney injury.  SBP's in the ED today: 130s to 140s mmHg.   Plan: Close monitoring of subsequent BP via routine VS. holding lisinopril for now in the setting of presenting acute kidney injury.              #) Allergic Rhinitis: documented h/o such, on scheduled intranasal Flonase as outpatient in addition to [place]: cont home Flonase and Xyzal.               #) Hyperlipidemia: documented h/o such. On simvastatin as outpatient.  In the setting of presenting generalized weakness as well as acute kidney injury, will add on CPK level.  Plan: Hold home statin for now while awaiting CPK level, as above.Marland Kitchen                #) Chronic diastolic heart failure: documented history of such, with most  recent echocardiogram performed in September 2017, which is notable for LVEF 65 to 70% as well as grade 1 diastolic dysfunction. No clinical evidence to suggest acutely decompensated heart failure at this time. home diuretic regimen reportedly consists of the following: None.  Will closely monitor for any ensuing evidence of acute volume overload given plan for gentle IV fluids overnight in the setting of presenting AKI, as above.   Plan: monitor strict I's & O's and daily weights. Repeat CMP in AM. Check serum mag level.          DVT prophylaxis: SCD's   Code Status: DNR, consistent with existing MOST form on file and confirmed by patient. Family Communication: Case was discussed with the patient's son Disposition Plan: Per Rounding Team Consults called: none;  Admission status: Inpatient    I SPENT GREATER THAN 75  MINUTES IN CLINICAL CARE TIME/MEDICAL DECISION-MAKING IN COMPLETING THIS ADMISSION.     Ferguson DO [facility] [place]   01/08/2023, 2:31 AM

## 2023-01-08 NOTE — Evaluation (Signed)
Physical Therapy Evaluation Patient Details Name: [name] MRN: [id number] DOB: 1928-07-25 Today's Date: 01/08/2023  History of Present Illness  [name] is a 89 y.o. female with medical history significant for essential hypertension, hyperlipidemia, chronic diastolic heart failure, vertigo, CVA, who is admitted to [facility] on 01/07/2023 with generalized weakness, fall.  Clinical Impression  Pt admitted with above diagnosis.  Pt currently with functional limitations due to the deficits listed below (see PT Problem List). Pt will benefit from skilled PT to increase their independence and safety with mobility to allow discharge to the venue listed below.    The patient is alert  and oriented. Reports  multiple  falls. Patient ids independent, using a  cane, still drives, lives alone.  If family available , patient  should  progress to return home with 24/7 caregivers and HHPT. No family present.  Patient ambulated x 20' using RW and min assistance.     Recommendations for follow up therapy are one component of a multi-disciplinary discharge planning process, led by the attending physician.  Recommendations may be updated based on patient status, additional functional criteria and insurance authorization.  Follow Up Recommendations Home health PT vs SNF if does not have support 24/7.      Assistance Recommended at Discharge Frequent or constant Supervision/Assistance  Patient can return home with the following  A little help with walking and/or transfers;A little help with bathing/dressing/bathroom;Assistance with cooking/housework;Assist for transportation    Equipment Recommendations  (rollator)  Recommendations for Other Services       Functional Status Assessment Patient has had a recent decline in their functional status and demonstrates the ability to make significant improvements in function in a reasonable and predictable amount of time.      Precautions / Restrictions Precautions Precautions: Fall Restrictions Weight Bearing Restrictions: No      Mobility  Bed Mobility Overal bed mobility: Needs Assistance Bed Mobility: Supine to Sit, Sit to Supine     Supine to sit: Min guard Sit to supine: Min guard   General bed mobility comments: patient moves self to sitting and return  to supine    Transfers Overall transfer level: Needs assistance Equipment used: Rolling walker (2 wheels) Transfers: Sit to/from Stand, Bed to chair/wheelchair/BSC Sit to Stand: Min assist   Step pivot transfers: Min assist       General transfer comment: steady assist to rise from stretcher and BSc, steps to  [facility] using RW    Ambulation/Gait Ambulation/Gait assistance: Min assist Gait Distance (Feet): 30 Feet Assistive device: Rolling walker (2 wheels) Gait Pattern/deviations: Step-to pattern, Step-through pattern Gait velocity: decr     General Gait Details: steady assistance  for balance and steady  Stairs            Wheelchair Mobility    Modified Rankin (Stroke Patients Only)       Balance Overall balance assessment: Needs assistance, History of Falls   Sitting balance-Leahy Scale: Fair                                       Pertinent Vitals/Pain Pain Assessment Pain Assessment: Faces Faces Pain Scale: Hurts a little bit Pain Location: generalized, Pain Descriptors / Indicators: Discomfort Pain Intervention(s): Monitored during session    Home Living Family/patient expects to be discharged to:: Private residence Living Arrangements: Alone Available Help at Discharge: Available PRN/intermittently  Type of Home: House Home Access: Stairs to enter Entrance Stairs-Rails: None Entrance Stairs-Number of Steps: 2   Home Layout: Two level;Able to live on main level with bedroom/bathroom Home Equipment: Shower seat - built in;Hand held shower head;Cane - single point Additional  Comments: says she has an "old walker"    Prior Function Prior Level of Function : Independent/Modified Independent             Mobility Comments: cane ADLs Comments: independent     Hand Dominance   Dominant Hand: Right    Extremity/Trunk Assessment   Upper Extremity Assessment Upper Extremity Assessment: Overall WFL for tasks assessed (5/5 Upper body strength)    Lower Extremity Assessment Lower Extremity Assessment: Defer to PT evaluation    Cervical / Trunk Assessment Cervical / Trunk Assessment: Kyphotic  Communication   Communication: No difficulties  Cognition Arousal/Alertness: Awake/alert Behavior During Therapy: WFL for tasks assessed/performed Overall Cognitive Status: Within Functional Limits for tasks assessed                                          General Comments      Exercises     Assessment/Plan    PT Assessment Patient needs continued PT services  PT Problem List Decreased strength;Decreased activity tolerance;Decreased mobility;Decreased balance;Pain;Decreased safety awareness       PT Treatment Interventions DME instruction;Therapeutic activities;Gait training;Functional mobility training;Patient/family education;Therapeutic exercise    PT Goals (Current goals can be found in the Care Plan section)  Acute Rehab PT Goals Patient Stated Goal: walk with my cane PT Goal Formulation: With patient Time For Goal Achievement: 01/22/23 Potential to Achieve Goals: Good    Frequency Min 3X/week     Co-evaluation               AM-PAC PT "6 Clicks" Mobility  Outcome Measure Help needed turning from your back to your side while in a flat bed without using bedrails?: A Little Help needed moving from lying on your back to sitting on the side of a flat bed without using bedrails?: A Little Help needed moving to and from a bed to a chair (including a wheelchair)?: A Little Help needed standing up from a chair using your  arms (e.g., wheelchair or bedside chair)?: A Little Help needed to walk in hospital room?: A Lot Help needed climbing 3-5 steps with a railing? : Total 6 Click Score: 15    End of Session Equipment Utilized During Treatment: Gait belt Activity Tolerance: Patient tolerated treatment well Patient left: in bed Nurse Communication: Mobility status PT Visit Diagnosis: Muscle weakness (generalized) (M62.81);History of falling (Z91.81);Difficulty in walking, not elsewhere classified (R26.2)    Time: 1610-9604 PT Time Calculation (min) (ACUTE ONLY): 29 min   Charges:   PT Evaluation $PT Eval Low Complexity: 1 Low          [place] Office [phone] Weekend [phone]   Claretha Cooper 01/08/2023, 1:09 PM

## 2023-01-08 NOTE — Progress Notes (Signed)
PROGRESS NOTE    [name]  OYD:[id number] DOB: 1928/07/05 DOA: 01/07/2023 PCP: Corliss Blacker, MD  Outpatient Specialists:     Brief Narrative:  Patient was admitted earlier today.  Patient is a 89 year old Caucasian female seen alongside daughter, Jocelyn Lamer.  Past medical history includes hypertension, hyperlipidemia and chronic diastolic heart failure.  Apparently, patient is failing to thrive at home.  Patient has been fallen.  Patient is admitted with volume depletion/dehydration with acute kidney injury.  Patient has been recently diagnosed with dementia.  Disposition will need to be addressed.  Get case management and social worker consult.   Assessment & Plan:   Principal Problem:   Generalized weakness Active Problems:   Essential hypertension   Hyperlipidemia   AKI (acute kidney injury) ([place])   Fall at home, initial encounter   Chronic diastolic CHF (congestive heart failure) (HCC)   Allergic rhinitis   Recurrent falls   Failure to thrive/generalized weakness: -Consult transition of care team. -Improving with hydration. -Consult PT OT.  Volume depletion: -Hydrate. -Continue to monitor renal function and electrolytes.  Acute kidney injury: -Likely prerenal. -Is improving slowly.  Subjective: No new complaints.  Objective: Vitals:   01/08/23 0809 01/08/23 1136 01/08/23 1403 01/08/23 1421  BP: (!) 151/76 112/64 136/62 (!) 134/53  Pulse: 88 92 94 95  Resp: '16 16 16 18  '$ Temp: 97.9 F (36.6 C) 98 F (36.7 C) 97.8 F (36.6 C) 98.3 F (36.8 C)  TempSrc: Oral  Oral Oral  SpO2: 97% 96% 100% 100%   No intake or output data in the 24 hours ending 01/08/23 1607 There were no vitals filed for this visit.  Examination:  General exam: Appears calm and comfortable Respiratory effort normal. Cardiovascular system: S1 & S2 heard Gastrointestinal system: Abdomen is soft and nontender.  Central nervous system: Alert and oriented.  Patient moves all  extremities.   Data Reviewed: I have personally reviewed following labs and imaging studies  CBC: Recent Labs  Lab 01/07/23 2130 01/08/23 0216  WBC 9.2 7.4  NEUTROABS  --  4.6  HGB 12.1 11.7*  HCT 38.8 37.5  MCV 88.4 88.9  PLT 307 672   Basic Metabolic Panel: Recent Labs  Lab 01/07/23 2130 01/08/23 0216  NA 136 135  K 4.4 4.3  CL 102 106  CO2 24 22  GLUCOSE 126* 100*  BUN 24* 23  CREATININE 1.49* 1.33*  CALCIUM 9.2 8.6*  MG  --  2.0   GFR: CrCl cannot be calculated (Unknown ideal weight.). Liver Function Tests: Recent Labs  Lab 01/08/23 0216  AST 32  ALT 19  ALKPHOS 33*  BILITOT 0.4  PROT 6.5  ALBUMIN 3.4*   No results for input(s): "LIPASE", "AMYLASE" in the last 168 hours. No results for input(s): "AMMONIA" in the last 168 hours. Coagulation Profile: No results for input(s): "INR", "PROTIME" in the last 168 hours. Cardiac Enzymes: Recent Labs  Lab 01/08/23 0216  CKTOTAL 172   BNP (last 3 results) No results for input(s): "PROBNP" in the last 8760 hours. HbA1C: No results for input(s): "HGBA1C" in the last 72 hours. CBG: No results for input(s): "GLUCAP" in the last 168 hours. Lipid Profile: No results for input(s): "CHOL", "HDL", "LDLCALC", "TRIG", "CHOLHDL", "LDLDIRECT" in the last 72 hours. Thyroid Function Tests: Recent Labs    01/08/23 1437  TSH 1.675   Anemia Panel: No results for input(s): "VITAMINB12", "FOLATE", "FERRITIN", "TIBC", "IRON", "RETICCTPCT" in the last 72 hours. Urine analysis:    Component  Value Date/Time   COLORURINE YELLOW 01/08/2023 0100   APPEARANCEUR CLEAR 01/08/2023 0100   LABSPEC 1.014 01/08/2023 0100   PHURINE 7.0 01/08/2023 0100   GLUCOSEU NEGATIVE 01/08/2023 0100   HGBUR NEGATIVE 01/08/2023 0100   BILIRUBINUR NEGATIVE 01/08/2023 0100   KETONESUR 5 (A) 01/08/2023 0100   PROTEINUR NEGATIVE 01/08/2023 0100   UROBILINOGEN 0.2 10/15/2007 1211   NITRITE NEGATIVE 01/08/2023 0100   LEUKOCYTESUR NEGATIVE  01/08/2023 0100   Sepsis Labs: '@LABRCNTIP'$ (procalcitonin:4,lacticidven:4)  ) Recent Results (from the past 240 hour(s))  Urine Culture     Status: Abnormal   Collection Time: 12/31/22  5:53 PM   Specimen: Urine, Clean Catch  Result Value Ref Range Status   Specimen Description URINE, CLEAN CATCH  Final   Special Requests   Final    NONE Performed at [facility], [place] [address], [place], [place] [address]    Culture >=100,000 COLONIES/mL STAPHYLOCOCCUS AUREUS (A)  Final   Report Status 01/02/2023 FINAL  Final   Organism ID, Bacteria STAPHYLOCOCCUS AUREUS (A)  Final      Susceptibility   Staphylococcus aureus - MIC*    CIPROFLOXACIN <=0.5 SENSITIVE Sensitive     GENTAMICIN <=0.5 SENSITIVE Sensitive     NITROFURANTOIN <=16 SENSITIVE Sensitive     OXACILLIN 0.5 SENSITIVE Sensitive     TETRACYCLINE <=1 SENSITIVE Sensitive     VANCOMYCIN 1 SENSITIVE Sensitive     TRIMETH/SULFA <=10 SENSITIVE Sensitive     CLINDAMYCIN <=0.25 SENSITIVE Sensitive     RIFAMPIN <=0.5 SENSITIVE Sensitive     Inducible Clindamycin NEGATIVE Sensitive     * >=100,000 COLONIES/mL STAPHYLOCOCCUS AUREUS         Radiology Studies: CT Cervical Spine Wo Contrast  Result Date: 01/07/2023 CLINICAL DATA:  Fall head trauma EXAM: CT HEAD WITHOUT CONTRAST CT CERVICAL SPINE WITHOUT CONTRAST TECHNIQUE: Multidetector CT imaging of the head and cervical spine was performed following the standard protocol without intravenous contrast. Multiplanar CT image reconstructions of the cervical spine were also generated. RADIATION DOSE REDUCTION: This exam was performed according to the departmental dose-optimization program which includes automated exposure control, adjustment of the mA and/or kV according to patient size and/or use of iterative reconstruction technique. COMPARISON:  CT brain 12/31/2022, CT brain and cervical spine 08/02/2017 FINDINGS: CT HEAD FINDINGS Brain: No acute territorial infarction, hemorrhage or  intracranial mass. Small chronic left frontal lobe infarct. Small chronic cerebellar infarcts. Atrophy. Advanced chronic small vessel ischemic changes of the white matter. Stable ventricle size. Vascular: No hyperdense vessels.  Carotid vascular calcification. Skull: Normal. Negative for fracture or focal lesion. Sinuses/Orbits: No acute finding. Other: None CT CERVICAL SPINE FINDINGS Alignment: No subluxation.  Facet alignment within normal limits. Skull base and vertebrae: No acute fracture. No primary bone lesion or focal pathologic process. Soft tissues and spinal canal: No prevertebral fluid or swelling. No visible canal hematoma. Disc levels: Multilevel degenerative change. Mild disc space narrowing at C4-C5 and C5-C6 with moderate severe disc space narrowing C6-C7 and C7-T1. facet degenerative changes at multiple levels with foraminal narrowing Upper chest: Negative. Other: None IMPRESSION: 1. No CT evidence for acute intracranial abnormality. Atrophy and chronic small vessel ischemic changes of the white matter. Chronic left frontal and cerebellar infarcts. 2. Degenerative changes of the cervical spine. No acute osseous abnormality. Electronically Signed   By: Donavan Foil M.D.   On: 01/07/2023 21:40   CT HEAD WO CONTRAST (5MM)  Result Date: 01/07/2023 CLINICAL DATA:  Fall head trauma EXAM: CT HEAD  WITHOUT CONTRAST CT CERVICAL SPINE WITHOUT CONTRAST TECHNIQUE: Multidetector CT imaging of the head and cervical spine was performed following the standard protocol without intravenous contrast. Multiplanar CT image reconstructions of the cervical spine were also generated. RADIATION DOSE REDUCTION: This exam was performed according to the departmental dose-optimization program which includes automated exposure control, adjustment of the mA and/or kV according to patient size and/or use of iterative reconstruction technique. COMPARISON:  CT brain 12/31/2022, CT brain and cervical spine 08/02/2017 FINDINGS: CT  HEAD FINDINGS Brain: No acute territorial infarction, hemorrhage or intracranial mass. Small chronic left frontal lobe infarct. Small chronic cerebellar infarcts. Atrophy. Advanced chronic small vessel ischemic changes of the white matter. Stable ventricle size. Vascular: No hyperdense vessels.  Carotid vascular calcification. Skull: Normal. Negative for fracture or focal lesion. Sinuses/Orbits: No acute finding. Other: None CT CERVICAL SPINE FINDINGS Alignment: No subluxation.  Facet alignment within normal limits. Skull base and vertebrae: No acute fracture. No primary bone lesion or focal pathologic process. Soft tissues and spinal canal: No prevertebral fluid or swelling. No visible canal hematoma. Disc levels: Multilevel degenerative change. Mild disc space narrowing at C4-C5 and C5-C6 with moderate severe disc space narrowing C6-C7 and C7-T1. facet degenerative changes at multiple levels with foraminal narrowing Upper chest: Negative. Other: None IMPRESSION: 1. No CT evidence for acute intracranial abnormality. Atrophy and chronic small vessel ischemic changes of the white matter. Chronic left frontal and cerebellar infarcts. 2. Degenerative changes of the cervical spine. No acute osseous abnormality. Electronically Signed   By: Donavan Foil M.D.   On: 01/07/2023 21:40        Scheduled Meds:  docusate sodium  100 mg Oral BID   fluticasone  2 spray Each Nare Daily   loratadine  10 mg Oral Daily   montelukast  10 mg Oral Daily   Continuous Infusions:  sodium chloride       LOS: 0 days        Dana Allan, MD  [facility] Pager #: [phone] 7PM-7AM contact night coverage as above

## 2023-01-09 DIAGNOSIS — I10 Essential (primary) hypertension: Secondary | ICD-10-CM | POA: Diagnosis not present

## 2023-01-09 DIAGNOSIS — W19XXXA Unspecified fall, initial encounter: Secondary | ICD-10-CM | POA: Diagnosis not present

## 2023-01-09 DIAGNOSIS — I5032 Chronic diastolic (congestive) heart failure: Secondary | ICD-10-CM | POA: Diagnosis not present

## 2023-01-09 DIAGNOSIS — N179 Acute kidney failure, unspecified: Secondary | ICD-10-CM | POA: Diagnosis not present

## 2023-01-09 LAB — RENAL FUNCTION PANEL
Albumin: 3.4 g/dL — ABNORMAL LOW (ref 3.5–5.0)
Anion gap: 5 (ref 5–15)
BUN: 13 mg/dL (ref 8–23)
CO2: 22 mmol/L (ref 22–32)
Calcium: 8.7 mg/dL — ABNORMAL LOW (ref 8.9–10.3)
Chloride: 110 mmol/L (ref 98–111)
Creatinine, Ser: 0.69 mg/dL (ref 0.44–1.00)
GFR, Estimated: >60 mL/min (ref >60)
Glucose, Bld: 95 mg/dL (ref 70–99)
Phosphorus: 2.3 mg/dL — ABNORMAL LOW (ref 2.5–4.6)
Potassium: 4.2 mmol/L (ref 3.5–5.1)
Sodium: 137 mmol/L (ref 135–145)

## 2023-01-09 MED ORDER — LISINOPRIL 10 MG PO TABS
10.0000 mg | ORAL_TABLET | Freq: Every day | ORAL | Status: DC
  Administered 2023-01-10 – 2023-01-12 (×3): 10 mg via ORAL
  Filled 2023-01-09 (×3): qty 1

## 2023-01-09 NOTE — TOC Progression Note (Signed)
Transition of Care [facility]) - Progression Note    Patient Details  Name: ROSAMARIA DONN MRN: [id number] Date of Birth: Oct 11, 1928  Transition of Care [facility]) CM/SW Contact  Rodney Booze, LCSW Phone Number: 01/09/2023, 10:50 AM  Clinical Narrative:    CSW spoke with the patient's daughter this AM about what her concerns were with the patient going home. The daughter stated that her mom's baseline was with a cane, the daughter reported that her mom has never used a RW. The daughter also did not agree with PT's evaluation. The daughter wanted to know why the mom could not go to a SNF, Dr. Loleta Books and this CSW explained to the daughter that the mom was able to go home with [facility] as that was the recommendation.The daughter does not agree with decision. The MD and this CSW has re-staffed with  TOC leadership. The patient will have other PT eval, CSW will see about sending the patient out to SNF to see if the patient is able to be accepted. The CSW did explain to the daughter to work on other DC plans in case her mom/ patient was not accepted. This CSW has reached out to PT to explain this patients previous baseline as I have not heard back from them at this time. TOC will continue to follow this patient.        Expected Discharge Plan and Services                                               Social Determinants of Health (SDOH) Interventions SDOH Screenings   Tobacco Use: Low Risk  (01/08/2023)    Readmission Risk Interventions     No data to display

## 2023-01-09 NOTE — TOC Progression Note (Signed)
Transition of Care [facility]) - Progression Note    Patient Details  Name: [name] MRN: [id number] Date of Birth: [birth year]  Transition of Care [facility]) CM/SW Contact  Rodney Booze, LCSW Phone Number: 01/09/2023, 3:28 PM  Clinical Narrative:    CSW spoke to the daughter about bed offers, The daughter was given the offers that has accepted her mother. This CSW did reach out to [facility] and [place] to see if they can review the patient as this was daughter's preferred choice. The daughter would like [facility].   CSW explained to the daughter that we can not wait on bed offers that if tomorrow we did not have anymore, or if we have not heard from [facility] that she would have to pick an offer.   The patient is ready for DC and the family will have to decided on a bed offer so that they do not lose opportunity for the patient to have a facility to go to. At this time she has been accepted into two facilities. TOC will continue to follow.        Expected Discharge Plan and Services                                               Social Determinants of Health (SDOH) Interventions SDOH Screenings   Tobacco Use: Low Risk  (01/08/2023)    Readmission Risk Interventions     No data to display

## 2023-01-09 NOTE — Progress Notes (Signed)
[id number]  PASRR

## 2023-01-09 NOTE — Assessment & Plan Note (Signed)
Due to dehydration 

## 2023-01-09 NOTE — NC FL2 (Signed)
[place] LEVEL OF CARE FORM     IDENTIFICATION  Patient Name: [name] Birthdate: 1928-12-26 Sex: female Admission Date (Current Location): 01/07/2023  [facility] and [place] Number:  Herbalist and Address:  [facility],  [place], [place]      Provider Number: [id number]  Attending Physician Name and Address:  Edwin Dada, *  Relative Name and Phone Number:  Jocelyn Lamer  [phone]    Current Level of Care: Hospital Recommended Level of Care: [place] Prior Approval Number: Working on [place]  Date Approved/Denied:   PASRR Number:    Discharge Plan: SNF    Current Diagnoses: Patient Active Problem List   Diagnosis Date Noted   Generalized weakness 01/08/2023   AKI (acute kidney injury) ([place]) 01/08/2023   Fall at home, initial encounter 01/08/2023   Chronic diastolic CHF (congestive heart failure) ([place]) 01/08/2023   Allergic rhinitis 01/08/2023   Recurrent falls 01/08/2023   Intracranial hemorrhage ([place]) 08/02/2017   Hyperlipidemia 04/22/2017   Radius and ulna distal fracture, left, closed, initial encounter 12/24/2016   Cerebral thrombosis with cerebral infarction 09/17/2016   Acute CVA (cerebrovascular accident) ([place]) 09/17/2016   CVA (cerebral infarction) 09/16/2016   Facial droop-left 09/16/2016   Slurred speech 09/16/2016   Hypertension    Benign positional vertigo    Essential hypertension    Varicose veins of lower extremities with other complications 70/35/0093   Pain in limb 01/06/2012    Orientation RESPIRATION BLADDER Height & Weight     Self, Time, Situation, Place  Normal Continent Weight:   Height:     BEHAVIORAL SYMPTOMS/MOOD NEUROLOGICAL BOWEL NUTRITION STATUS        Diet  AMBULATORY STATUS COMMUNICATION OF NEEDS Skin   Supervision Verbally Normal                       Personal Care Assistance Level of Assistance  Bathing, Feeding, Dressing,  Total care Bathing Assistance: Limited assistance Feeding assistance: Independent Dressing Assistance: Limited assistance Total Care Assistance: Limited assistance   Functional Limitations Info  Sight, Hearing, Speech Sight Info: Adequate   Speech Info: Adequate    SPECIAL CARE FACTORS FREQUENCY                       Contractures Contractures Info: Not present    Additional Factors Info  Code Status, Allergies Code Status Info: DNR Allergies Info: Amoxicillin High Intolerance Diarrhea, Nausea And Vomiting, Other (See Comments) c-diff from antibiotics 2009 Ciprofloxacin High Intolerance Diarrhea, Nausea And Vomiting, Other (See Comments) c-diff from antibiotics 2009 Clindamycin/lincomycin High Intolerance Diarrhea, Nausea And Vomiting, Other (See Comments) c-diff from antiobiotics 2009 Codeine Medium Allergy Nausea And Vomiting  Penicillins Medium Allergy Hives, Swelling, Rash, Other (See Comments) Has patient had a PCN reaction causing immediate rash, facial/tongue/throat swelling, SOB or lightheadedness with hypotension: swelling of arm, and rash Has patient had a PCN reaction causing severe rash involving mucus membranes or skin necrosis: unknown Has patient had a PCN reaction that required hospitalization : unknown Has patient had a PCN reaction occurring within the last 10 years: no If all of the above answers are "NO", then may proceed with Cephalosporin use. Tetanus Toxoids Medium Allergy Hives Reaction many years ago Atorvastatin Low Intolerance Diarrhea  Ultracet (tramadol-acetaminophen) Low Intolerance Nausea And Vomiting           Current Medications (01/09/2023):  This is the current hospital  active medication list Current Facility-Administered Medications  Medication Dose Route Frequency Provider Last Rate Last Admin   acetaminophen (TYLENOL) tablet 650 mg  650 mg Oral Q6H PRN Howerter, Justin B, DO       Or   acetaminophen (TYLENOL) suppository 650 mg  650 mg Rectal  Q6H PRN Howerter, Justin B, DO       docusate sodium (COLACE) capsule 100 mg  100 mg Oral BID Howerter, Justin B, DO   100 mg at 01/09/23 0925   fluticasone (FLONASE) 50 MCG/ACT nasal spray 2 spray  2 spray Each Nare Daily Howerter, Justin B, DO   2 spray at 01/09/23 0925   loratadine (CLARITIN) tablet 10 mg  10 mg Oral Daily Howerter, Justin B, DO   10 mg at 01/09/23 0924   melatonin tablet 3 mg  3 mg Oral QHS PRN Howerter, Justin B, DO   3 mg at 01/08/23 2100   montelukast (SINGULAIR) tablet 10 mg  10 mg Oral Daily Howerter, Justin B, DO   10 mg at 01/09/23 4496     Discharge Medications: Please see discharge summary for a list of discharge medications.  Relevant Imaging Results:  Relevant Lab Results:   Additional Information [place] Bowyn Mercier LCSW-A  patients SSI # [id number]  Rodney Booze, [place]

## 2023-01-09 NOTE — NC FL2 (Signed)
Loop LEVEL OF CARE FORM     IDENTIFICATION  Patient Name: [name] Birthdate: Aug 11, 1928 Sex: female Admission Date (Current Location): 01/07/2023  [facility] and [place] Number:  Herbalist and Address:  [facility],  [place], [place]      Provider Number: [id number]  Attending Physician Name and Address:  Edwin Dada, *  Relative Name and Phone Number:  Jocelyn Lamer  [phone]    Current Level of Care: Hospital Recommended Level of Care: [place] Prior Approval Number: Working on [place]  Date Approved/Denied:   PASRR Number:  [id number]  Discharge Plan: SNF    Current Diagnoses: Patient Active Problem List   Diagnosis Date Noted   Generalized weakness 01/08/2023   AKI (acute kidney injury) ([place]) 01/08/2023   Fall at home, initial encounter 01/08/2023   Chronic diastolic CHF (congestive heart failure) ([place]) 01/08/2023   Allergic rhinitis 01/08/2023   Recurrent falls 01/08/2023   Intracranial hemorrhage ([place]) 08/02/2017   Hyperlipidemia 04/22/2017   Radius and ulna distal fracture, left, closed, initial encounter 12/24/2016   Cerebral thrombosis with cerebral infarction 09/17/2016   Acute CVA (cerebrovascular accident) ([place]) 09/17/2016   CVA (cerebral infarction) 09/16/2016   Facial droop-left 09/16/2016   Slurred speech 09/16/2016   Hypertension    Benign positional vertigo    Essential hypertension    Varicose veins of lower extremities with other complications 00/86/7619   Pain in limb 01/06/2012    Orientation RESPIRATION BLADDER Height & Weight     Self, Time, Situation, Place  Normal Continent Weight:   Height:     BEHAVIORAL SYMPTOMS/MOOD NEUROLOGICAL BOWEL NUTRITION STATUS        Diet  AMBULATORY STATUS COMMUNICATION OF NEEDS Skin   Supervision Verbally Normal                       Personal Care Assistance Level of Assistance  Bathing, Feeding,  Dressing, Total care Bathing Assistance: Limited assistance Feeding assistance: Independent Dressing Assistance: Limited assistance Total Care Assistance: Limited assistance   Functional Limitations Info  Sight, Hearing, Speech Sight Info: Adequate   Speech Info: Adequate    SPECIAL CARE FACTORS FREQUENCY                       Contractures Contractures Info: Not present    Additional Factors Info  Code Status, Allergies Code Status Info: DNR Allergies Info: Amoxicillin High Intolerance Diarrhea, Nausea And Vomiting, Other (See Comments) c-diff from antibiotics 2009 Ciprofloxacin High Intolerance Diarrhea, Nausea And Vomiting, Other (See Comments) c-diff from antibiotics 2009 Clindamycin/lincomycin High Intolerance Diarrhea, Nausea And Vomiting, Other (See Comments) c-diff from antiobiotics 2009 Codeine Medium Allergy Nausea And Vomiting  Penicillins Medium Allergy Hives, Swelling, Rash, Other (See Comments) Has patient had a PCN reaction causing immediate rash, facial/tongue/throat swelling, SOB or lightheadedness with hypotension: swelling of arm, and rash Has patient had a PCN reaction causing severe rash involving mucus membranes or skin necrosis: unknown Has patient had a PCN reaction that required hospitalization : unknown Has patient had a PCN reaction occurring within the last 10 years: no If all of the above answers are "NO", then may proceed with Cephalosporin use. Tetanus Toxoids Medium Allergy Hives Reaction many years ago Atorvastatin Low Intolerance Diarrhea  Ultracet (tramadol-acetaminophen) Low Intolerance Nausea And Vomiting           Current Medications (01/09/2023):  This is the current hospital  active medication list Current Facility-Administered Medications  Medication Dose Route Frequency Provider Last Rate Last Admin   acetaminophen (TYLENOL) tablet 650 mg  650 mg Oral Q6H PRN Howerter, Justin B, DO       Or   acetaminophen (TYLENOL) suppository 650 mg  650  mg Rectal Q6H PRN Howerter, Justin B, DO       docusate sodium (COLACE) capsule 100 mg  100 mg Oral BID Howerter, Justin B, DO   100 mg at 01/09/23 0925   fluticasone (FLONASE) 50 MCG/ACT nasal spray 2 spray  2 spray Each Nare Daily Howerter, Justin B, DO   2 spray at 01/09/23 0925   loratadine (CLARITIN) tablet 10 mg  10 mg Oral Daily Howerter, Justin B, DO   10 mg at 01/09/23 0924   melatonin tablet 3 mg  3 mg Oral QHS PRN Howerter, Justin B, DO   3 mg at 01/08/23 2100   montelukast (SINGULAIR) tablet 10 mg  10 mg Oral Daily Howerter, Justin B, DO   10 mg at 01/09/23 7322     Discharge Medications: Please see discharge summary for a list of discharge medications.  Relevant Imaging Results:  Relevant Lab Results:   Additional Information [place] Cash Duce LCSW-A  patients SSI # [id number]  Rodney Booze, [place]

## 2023-01-09 NOTE — Hospital Course (Signed)
[name] is a 89 y.o. F with HTN, dCHF who presented with fall.  Patient had been unwell for about 3 weeks prior to admission, "sick" and with generalized weakness.  Seen in ER 1 week prior to admission, diagnosed with UTI, discharged with Bactrim.  Took only 2 doses prior to falling again, and family brought to ER.    1/11: Cr 1.5 from baseline 0.7-0.9, admitted on fluids 1/12: Cr improved to 1.3 1/13: Cr improved to baseline

## 2023-01-09 NOTE — Progress Notes (Signed)
  Progress Note   [name] TIW:[id number] DOB: [birth year] DOA: 01/07/2023     1 DOS: the patient was seen and examined on 01/09/2023        Brief hospital course: [name] is a 89 y.o. F with HTN, dCHF who presented with fall.  Patient diagnosed 1 week ago with UTI, treated with Bactrim, culture growing staph.  Urinary irritative symptoms resolved, but patient getting weaker and weaker, finally fell.    In the ER, vital signs normal.  Metabolic panel showed Cr 1.5 from baseline 0.9 and so she was admitted for dehydration.     Assessment and Plan: * AKI (acute kidney injury) ([place]) Baseline Cr 0.9, here Cr up to 1.9.  Pre-renal.  Likely due to poor intake in setting of recent UTI.  Treated with fluids and resolved.  Chronic diastolic CHF (congestive heart failure) ([facility]) Not on diuretic at baseline.  Appears euvolemic now.  Fall at home, initial encounter Due to weakness due to dehydration.    Generalized weakness Due to dehydration  Essential hypertension BP elevated - Resume lisinopril          Subjective: No complaints, no nursing concerns.     Physical Exam: BP (!) 153/61 (BP Location: Left Arm)   Pulse 85   Temp 98.5 F (36.9 C) (Oral)   Resp 16   SpO2 100%   Thin elderly female, lying in bed, kyphotic, no acute distress RRR, no murmurs, no peripheral edema Respiratory normal, lungs clear without rales or wheezes Abdomen soft no tenderness palpation or guarding Attention normal, hard of hearing, face symmetric, speech fluent, generalized weakness but symmetric strength    Data Reviewed: Patient metabolic panel and complete blood count reviewed and summarized above  Family Communication: Daughter at the bedside    Disposition: Status is: Inpatient Patient was admitted for dehydration, this is resolved and she is medically ready for discharge.  However she lives alone and family unable to care for her.  She was  evaluated by therapy who felt she was high risk of fall and injury if she were to return to her prior living arrangement.  TOC are engaged to locate a safe disposition.        Author: Edwin Dada, MD 01/09/2023 4:17 PM  For on call review [email or web address].

## 2023-01-09 NOTE — Progress Notes (Signed)
Physical Therapy Treatment Patient Details Name: [name] MRN: [id number] DOB: 12-Aug-1928 Today's Date: 01/09/2023   History of Present Illness [name] is a 89 y.o. female with medical history significant for essential hypertension, hyperlipidemia, chronic diastolic heart failure, vertigo, CVA, who is admitted to [facility] on 01/07/2023 with generalized weakness, fall.    PT Comments    The patient's daughter  present and provides update on patient  prior level of function: Patient does not drive anymore.  Reports multiple falls recently.  Patient will benefit from post acute rehab to improve balance and functional independence  Recommendations for follow up therapy are one component of a multi-disciplinary discharge planning process, led by the attending physician.  Recommendations may be updated based on patient status, additional functional criteria and insurance authorization.  Follow Up Recommendations  Skilled nursing-short term rehab (<3 hours/day) Can patient physically be transported by private vehicle: Yes   Assistance Recommended at Discharge Frequent or constant Supervision/Assistance  Patient can return home with the following A little help with walking and/or transfers;A little help with bathing/dressing/bathroom;Assistance with cooking/housework;Assist for transportation   Equipment Recommendations  Rollator (4 wheels)    Recommendations for Other Services       Precautions / Restrictions Precautions Precautions: Fall     Mobility  Bed Mobility   Bed Mobility: Supine to Sit     Supine to sit: Min guard     General bed mobility comments: patient moves self to sitting and return  to supine    Transfers Overall transfer level: Needs assistance Equipment used: Rolling walker (2 wheels) Transfers: Sit to/from Stand Sit to Stand: Min assist           General transfer comment: steady assist to rise from  feet sliding  foorward, support to prevent sliding as  patient tends to push backwards. Sever kyphosis so COG is impacted.    Ambulation/Gait Ambulation/Gait assistance: Min assist Gait Distance (Feet): 60 Feet Assistive device: Rolling walker (2 wheels) Gait Pattern/deviations: Step-to pattern, Step-through pattern Gait velocity: decr     General Gait Details: steady assistance  for balance and steady for turning  and guiding Rw, vision somewhat affected by kyphosis.   Stairs             Wheelchair Mobility    Modified Rankin (Stroke Patients Only)       Balance Overall balance assessment: History of Falls, Needs assistance Sitting-balance support: Bilateral upper extremity supported, Feet supported Sitting balance-Leahy Scale: Fair     Standing balance support: Bilateral upper extremity supported, During functional activity, Reliant on assistive device for balance Standing balance-Leahy Scale: Poor Standing balance comment: decreased balance when attempting to look up.                            Cognition Arousal/Alertness: Awake/alert Behavior During Therapy: WFL for tasks assessed/performed Overall Cognitive Status: Within Functional Limits for tasks assessed                                          Exercises      General Comments        Pertinent Vitals/Pain Pain Assessment Faces Pain Scale: Hurts a little bit Pain Location: left face Pain Descriptors / Indicators: Discomfort Pain Intervention(s): Monitored during session    Home Living  Prior Function            PT Goals (current goals can now be found in the care plan section) Progress towards PT goals: Progressing toward goals    Frequency    Min 3X/week      PT Plan Discharge plan needs to be updated    Co-evaluation              AM-PAC PT "6 Clicks" Mobility   Outcome Measure  Help needed turning from your back to your  side while in a flat bed without using bedrails?: A Little Help needed moving from lying on your back to sitting on the side of a flat bed without using bedrails?: A Little Help needed moving to and from a bed to a chair (including a wheelchair)?: A Little Help needed standing up from a chair using your arms (e.g., wheelchair or bedside chair)?: A Little Help needed to walk in hospital room?: A Lot Help needed climbing 3-5 steps with a railing? : Total 6 Click Score: 15    End of Session Equipment Utilized During Treatment: Gait belt Activity Tolerance: Patient tolerated treatment well Patient left: in chair;with chair alarm set;with family/visitor present Nurse Communication: Mobility status PT Visit Diagnosis: Muscle weakness (generalized) (M62.81);History of falling (Z91.81);Difficulty in walking, not elsewhere classified (R26.2)     Time: 6568-1275 PT Time Calculation (min) (ACUTE ONLY): 26 min  Charges:  $Gait Training: 8-22 mins                     [place] Office [phone] Weekend [phone]    Claretha Cooper 01/09/2023, 12:46 PM

## 2023-01-09 NOTE — Assessment & Plan Note (Signed)
Not on diuretic at baseline.  Appears euvolemic now.

## 2023-01-09 NOTE — Assessment & Plan Note (Signed)
BP normal - Continue home lisinopril

## 2023-01-09 NOTE — Progress Notes (Signed)
CSW reached out to tracy at [facility] to see if she may look at this patient.  The family preferred this facility, however If the facility can not accept this patient the family will have to go with the bed offers that are available. Olivia Mackie stated that she would have the weekend nurse follow up and review the patient. TOC will continue to follow.

## 2023-01-09 NOTE — Assessment & Plan Note (Signed)
Baseline Cr 0.9, here Cr up to 1.9.  Pre-renal.  Likely due to poor intake in setting of recent UTI.  Treated with fluids and resolved.

## 2023-01-09 NOTE — Assessment & Plan Note (Signed)
Due to weakness due to dehydration.

## 2023-01-09 NOTE — TOC Progression Note (Signed)
Transition of Care [facility]) - Progression Note    Patient Details  Name: [name] MRN: [id number] Date of Birth: [birth year]  Transition of Care [facility]) CM/SW Contact  Rodney Booze, LCSW Phone Number: 01/09/2023, 1:05 PM  Clinical Narrative:     CSW has sent the patient out as PT has reevaluated this patient. [facility] is the first choice per family wishes. CSW has explained in great detail how the SNF process works from the start of sending out bed offers in which we do not have any control, nor can we give recommendations, however we advised our families to go to [email or web address] from the bed offers being presented for choice to the patient/ family, and last sending it out for [place] to get insurance approval. The family is aware that the choice is not a guarantee and that they have a right to deny the bed offer but would need to have set up secondary arrangements to take the patient home.      Expected Discharge Plan and Services                                               Social Determinants of Health (SDOH) Interventions SDOH Screenings   Tobacco Use: Low Risk  (01/08/2023)    Readmission Risk Interventions     No data to display

## 2023-01-10 DIAGNOSIS — I5032 Chronic diastolic (congestive) heart failure: Secondary | ICD-10-CM | POA: Diagnosis not present

## 2023-01-10 DIAGNOSIS — N3000 Acute cystitis without hematuria: Secondary | ICD-10-CM

## 2023-01-10 DIAGNOSIS — N179 Acute kidney failure, unspecified: Secondary | ICD-10-CM | POA: Diagnosis not present

## 2023-01-10 DIAGNOSIS — I10 Essential (primary) hypertension: Secondary | ICD-10-CM | POA: Diagnosis not present

## 2023-01-10 MED ORDER — NITROFURANTOIN MONOHYD MACRO 100 MG PO CAPS
100.0000 mg | ORAL_CAPSULE | Freq: Two times a day (BID) | ORAL | Status: DC
  Administered 2023-01-10 – 2023-01-12 (×5): 100 mg via ORAL
  Filled 2023-01-10 (×6): qty 1

## 2023-01-10 NOTE — Progress Notes (Signed)
  Progress Note   [name] YME:[id number] DOB: 05-28-1928 DOA: 01/07/2023     2 DOS: the patient was seen and examined on 01/10/2023 at 11:30 AM      Brief hospital course: [name] is a 89 y.o. F with HTN, dCHF who presented with fall.  Patient had been unwell for about 3 weeks prior to admission, "sick" and with generalized weakness.  Seen in ER 1 week prior to admission, diagnosed with UTI, discharged with Bactrim.  Took only 2 doses prior to falling again, and family brought to ER.    1/11: Cr 1.5 from baseline 0.7-0.9, admitted on fluids 1/12: Cr improved to 1.3 1/13: Cr improved to baseline     Assessment and Plan: * AKI (acute kidney injury) ([place]) Baseline Cr 0.9, here Cr up to 1.49 on admission.  Pre-renal.  Likely due to poor intake in setting of recent UTI.    Treated with fluids and resolved.  Acute cystitis Family report incomplete treatment of cystitis.  Patient endorses persistent urinary urgency.  Culture from 1/4 reviewed. - Start Macrobid 100 BID for 7 days  Chronic diastolic CHF (congestive heart failure) ([place]) Not on diuretic at baseline.  Appears euvolemic now.  Fall at home, initial encounter Due to weakness due to dehydration.    Generalized weakness Due to dehydration  Essential hypertension BP normal - Continue home lisinopril          Subjective: No new complaints, still having dysuria, no fever, no confusion.     Physical Exam: BP 121/66 (BP Location: Right Arm)   Pulse 82   Temp 98.6 F (37 C) (Oral)   Resp 14   SpO2 98%   Thin adult female, sitting in recliner, no acute distress RRR, no murmurs, no peripheral edema Respiratory effort weak, kyphotic, no rales or wheezes appreciated Abdomen soft without tenderness palpation or guarding Hard of hearing, face symmetric, speech fluent, able to transfer with minimal assistance generalized weakness diffuse     Family Communication: Wife and  son-in-law at bedside    Disposition: Status is: Inpatient Patient remains medically stable for discharge when safe disposition can be arranged        Author: Edwin Dada, MD 01/10/2023 1:59 PM  For on call review [email or web address].

## 2023-01-10 NOTE — Progress Notes (Signed)
Mobility Specialist - Progress Note   01/10/23 1026  Mobility  Activity Transferred from bed to chair  Level of Assistance Minimal assist, patient does 75% or more  Assistive Device Cane  Distance Ambulated (ft) 5 ft  Activity Response Tolerated well  Mobility Referral Yes  $Mobility charge 1 Mobility   Pt received in bed and agreeable to mobility. Pt is ModA for bed mobility, MinA for sit-to-stand & contact during ambulation due to fear of falling. No complaints during transfer. Pt to recliner after session with all needs met.   [facility]

## 2023-01-10 NOTE — TOC Progression Note (Signed)
Transition of Care [facility]) - Progression Note    Patient Details  Name: [name] MRN: [id number] Date of Birth: 04/30/1928  Transition of Care [facility]) CM/SW [place], LCSW Phone Number: 01/10/2023, 12:34 PM  Clinical Narrative:    TOC spoke with pt's daughter and son-in-law. Pt's family expressed their frustration about the information that was told to them. CSW apologized for the misunderstanding and clarified some information including pt's bed offers, which were only [facility] and [facility]. Pt's family reported wanting [facility], CSW informed pt's family the DON is still reviewing pt's information. Pt's family stated they would like to check [facility] as well. CSW informed pt's family she would reach out to the coordinator to see if there were any open beds. Pt's family stated they know the coordinator's uncle, whom they have reached out to and are waiting for a response. CSW informed pt's family they will need to decide on a bed choice soon or the pt will be liable for her bill. CSW attempted to reach out to Whitney at [facility] awaiting a response. TOC to follow.          Expected Discharge Plan and Services                                               Social Determinants of Health (SDOH) Interventions SDOH Screenings   Tobacco Use: Low Risk  (01/08/2023)    Readmission Risk Interventions     No data to display

## 2023-01-10 NOTE — Assessment & Plan Note (Addendum)
Family report incomplete treatment of cystitis.  Patient endorses persistent urinary urgency.  Culture from 1/4 reviewed. - Start Macrobid 100 BID for 7 days

## 2023-01-11 DIAGNOSIS — N3000 Acute cystitis without hematuria: Secondary | ICD-10-CM | POA: Diagnosis not present

## 2023-01-11 DIAGNOSIS — I10 Essential (primary) hypertension: Secondary | ICD-10-CM | POA: Diagnosis not present

## 2023-01-11 DIAGNOSIS — N179 Acute kidney failure, unspecified: Secondary | ICD-10-CM | POA: Diagnosis not present

## 2023-01-11 DIAGNOSIS — I5032 Chronic diastolic (congestive) heart failure: Secondary | ICD-10-CM | POA: Diagnosis not present

## 2023-01-11 LAB — BASIC METABOLIC PANEL
Anion gap: 6 (ref 5–15)
BUN: 20 mg/dL (ref 8–23)
CO2: 23 mmol/L (ref 22–32)
Calcium: 9 mg/dL (ref 8.9–10.3)
Chloride: 105 mmol/L (ref 98–111)
Creatinine, Ser: 0.99 mg/dL (ref 0.44–1.00)
GFR, Estimated: 53 mL/min — ABNORMAL LOW (ref >60)
Glucose, Bld: 96 mg/dL (ref 70–99)
Potassium: 4.2 mmol/L (ref 3.5–5.1)
Sodium: 134 mmol/L — ABNORMAL LOW (ref 135–145)

## 2023-01-11 NOTE — Progress Notes (Signed)
No admission history has been obtained since admission due to patient being a poor historian. She is presently alert and oriented to self only. Will notify day RN that this needs to be obtained from family upon visitation in [name], [name]

## 2023-01-11 NOTE — TOC Progression Note (Addendum)
Transition of Care [facility]) - Progression Note    Patient Details  Name: INGE WALDROUP MRN: [id number] Date of Birth: 04-Mar-1928  Transition of Care [facility]) CM/SW [place], RN Phone Number:[phone]  01/11/2023, 2:14 PM  Clinical Narrative:    CM at bedside per daughter request. CM updated daughter on bed offers. Daughter would like [facility]. CM has called Olivia Mackie with [facility] to confirm bed offer. [facility] has accepted patient. Insurance auth initiated with approval [employer]  Auth ID # [id number]. CM called Olivia Mackie to verify what the hold means. Olivia Mackie to call [employer] contact to determine what the hold means and will call CM back.   [place] spoke with Olivia Mackie who is not able to clarify why the hold is on approval due to holiday and the person that she would need to speak to in not working today. Will hold for discharge in the am after clarification of hold on the approval.         Expected Discharge Plan and Services                                               Social Determinants of Health (SDOH) Interventions SDOH Screenings   Tobacco Use: Low Risk  (01/08/2023)    Readmission Risk Interventions     No data to display

## 2023-01-11 NOTE — Progress Notes (Signed)
  Progress Note   [name] HQR:[id number] DOB: [birth year] DOA: 01/07/2023     3 DOS: the patient was seen and examined on 01/11/2023 at 11:30 AM      Brief hospital course: Mrs. Staebell is a 89 y.o. F with HTN, dCHF who presented with fall.  See prior summary     Assessment and Plan: * AKI (acute kidney injury) ([place]) Resolved, repeat CR today normal  Acute cystitis - Continue macrobid day 2 of 7   Essential hypertension BP normal, Cr normal - Continue home lisinopril          Subjective: No new complaints, no nursing concerns.     Physical Exam: BP (!) 109/50 (BP Location: Right Arm)   Pulse 88   Temp 98.1 F (36.7 C) (Oral)   Resp 16   SpO2 98%   Thin adult female, sitting in recliner, no acute distress RRR, no murmurs, no peripheral edema, radial pulses 2+ and symmetric Respiratory rate normal, kyphotic, effort weak due to kyphosis, lung sounds diminished Attention normal, affect normal, judgment and insight appear normal, mild generalized weakness     Family Communication: Daughter at the bedside    Disposition: Status is: Inpatient Patient has no further inpatient needs and is medically stable for discharge when safe disposition can be arranged.  At present our discharge planning team and family assure me there is no safe place to discharge the patient to        Author: Edwin Dada, MD 01/11/2023 4:01 PM  For on call review [email or web address].

## 2023-01-11 NOTE — Care Management Important Message (Signed)
Important Message  Patient Details IM Letter given. Name: [name] MRN: [id number] Date of Birth: 1928-10-22   Medicare Important Message Given:  Yes     Kerin Salen 01/11/2023, 10:13 AM

## 2023-01-11 NOTE — Progress Notes (Signed)
Physical Therapy Treatment Patient Details Name: [name] MRN: [id number] DOB: [birth year] Today's Date: 01/11/2023   History of Present Illness [name] is a 89 y.o. female with medical history significant for essential hypertension, hyperlipidemia, chronic diastolic heart failure, vertigo, CVA, who is admitted to [facility] on 01/07/2023 with generalized weakness, fall.    PT Comments    Patient reports feeling weak.   Instructed in use of rollator, Min assistance for balance and safety.  Patient's daughter present. Continue PT.    Recommendations for follow up therapy are one component of a multi-disciplinary discharge planning process, led by the attending physician.  Recommendations may be updated based on patient status, additional functional criteria and insurance authorization.  Follow Up Recommendations  Skilled nursing-short term rehab (<3 hours/day) Can patient physically be transported by private vehicle: Yes   Assistance Recommended at Discharge Frequent or constant Supervision/Assistance  Patient can return home with the following A little help with walking and/or transfers;A little help with bathing/dressing/bathroom;Assistance with cooking/housework;Assist for transportation   Equipment Recommendations  Rollator (4 wheels)    Recommendations for Other Services       Precautions / Restrictions Precautions Precautions: Fall     Mobility  Bed Mobility   Bed Mobility: Supine to Sit     Supine to sit: Min guard     General bed mobility comments: patient moves self to sitting    Transfers Overall transfer level: Needs assistance Equipment used: Rollator (4 wheels) Transfers: Sit to/from Stand Sit to Stand: Min assist           General transfer comment: cues for use of brakes, steady assist to rise    Ambulation/Gait Ambulation/Gait assistance: Min assist Gait Distance (Feet): 90 Feet Assistive device: Rollator (4  wheels) Gait Pattern/deviations: Step-to pattern, Step-through pattern       General Gait Details: steady assistance  for balance and steady for turning  and guiding Rw, vision somewhat affected by kyphosis.   Stairs             Wheelchair Mobility    Modified Rankin (Stroke Patients Only)       Balance Overall balance assessment: History of Falls, Needs assistance Sitting-balance support: Bilateral upper extremity supported, Feet supported Sitting balance-Leahy Scale: Fair     Standing balance support: Bilateral upper extremity supported, During functional activity, Reliant on assistive device for balance Standing balance-Leahy Scale: Poor                              Cognition Arousal/Alertness: Awake/alert Behavior During Therapy: WFL for tasks assessed/performed   Area of Impairment: Orientation                 Orientation Level: Time                      Exercises      General Comments        Pertinent Vitals/Pain Pain Assessment Pain Assessment: No/denies pain    Home Living                          Prior Function            PT Goals (current goals can now be found in the care plan section) Progress towards PT goals: Progressing toward goals    Frequency    Min 2X/week  PT Plan Discharge plan needs to be updated;Current plan remains appropriate    Co-evaluation              AM-PAC PT "6 Clicks" Mobility   Outcome Measure  Help needed turning from your back to your side while in a flat bed without using bedrails?: A Little Help needed moving from lying on your back to sitting on the side of a flat bed without using bedrails?: A Little Help needed moving to and from a bed to a chair (including a wheelchair)?: A Little Help needed standing up from a chair using your arms (e.g., wheelchair or bedside chair)?: A Little Help needed to walk in hospital room?: A Little Help needed climbing  3-5 steps with a railing? : A Lot 6 Click Score: 17    End of Session Equipment Utilized During Treatment: Gait belt Activity Tolerance: Patient tolerated treatment well Patient left: in chair;with chair alarm set;with family/visitor present Nurse Communication: Mobility status PT Visit Diagnosis: Muscle weakness (generalized) (M62.81);History of falling (Z91.81);Difficulty in walking, not elsewhere classified (R26.2)     Time: 5009-3818 PT Time Calculation (min) (ACUTE ONLY): 23 min  Charges:  $Gait Training: 23-37 mins                     [place] Office [phone] Weekend [phone]    Claretha Cooper 01/11/2023, 12:20 PM

## 2023-01-12 DIAGNOSIS — N390 Urinary tract infection, site not specified: Secondary | ICD-10-CM | POA: Diagnosis not present

## 2023-01-12 DIAGNOSIS — I130 Hypertensive heart and chronic kidney disease with heart failure and stage 1 through stage 4 chronic kidney disease, or unspecified chronic kidney disease: Secondary | ICD-10-CM | POA: Diagnosis not present

## 2023-01-12 DIAGNOSIS — R300 Dysuria: Secondary | ICD-10-CM | POA: Diagnosis not present

## 2023-01-12 DIAGNOSIS — N183 Chronic kidney disease, stage 3 unspecified: Secondary | ICD-10-CM | POA: Diagnosis not present

## 2023-01-12 DIAGNOSIS — K219 Gastro-esophageal reflux disease without esophagitis: Secondary | ICD-10-CM | POA: Diagnosis not present

## 2023-01-12 DIAGNOSIS — N3000 Acute cystitis without hematuria: Secondary | ICD-10-CM | POA: Diagnosis not present

## 2023-01-12 DIAGNOSIS — I10 Essential (primary) hypertension: Secondary | ICD-10-CM | POA: Diagnosis not present

## 2023-01-12 DIAGNOSIS — Z7401 Bed confinement status: Secondary | ICD-10-CM | POA: Diagnosis not present

## 2023-01-12 DIAGNOSIS — R531 Weakness: Secondary | ICD-10-CM | POA: Diagnosis not present

## 2023-01-12 DIAGNOSIS — J309 Allergic rhinitis, unspecified: Secondary | ICD-10-CM | POA: Diagnosis not present

## 2023-01-12 DIAGNOSIS — Z9181 History of falling: Secondary | ICD-10-CM | POA: Diagnosis not present

## 2023-01-12 DIAGNOSIS — I5032 Chronic diastolic (congestive) heart failure: Secondary | ICD-10-CM | POA: Diagnosis not present

## 2023-01-12 DIAGNOSIS — Z79899 Other long term (current) drug therapy: Secondary | ICD-10-CM | POA: Diagnosis not present

## 2023-01-12 DIAGNOSIS — N179 Acute kidney failure, unspecified: Secondary | ICD-10-CM | POA: Diagnosis not present

## 2023-01-12 DIAGNOSIS — W19XXXA Unspecified fall, initial encounter: Secondary | ICD-10-CM | POA: Diagnosis not present

## 2023-01-12 MED ORDER — NITROFURANTOIN MONOHYD MACRO 100 MG PO CAPS: 100.0000 mg | ORAL_CAPSULE | Freq: Two times a day (BID) | ORAL | 0 refills | Status: AC

## 2023-01-12 NOTE — Progress Notes (Signed)
Patient is being discharged to [facility]. Report called to [facility], LPN. Daughter is at the bedside and aware of transfer. PTAR will provide transportation.

## 2023-01-12 NOTE — Discharge Summary (Signed)
Physician Discharge Summary   [name] MRN: [id number] DOB: 1928/05/20  Admit date:     01/07/2023  Discharge date: 01/12/23  Discharge Physician: Edwin Dada   PCP: Corliss Blacker, MD     Recommendations at discharge:  Complete Nitrofurantoin for UTI on 1/20 Follow up with PCP Dr. Gemma Payor within 1 week of discharge from SNF     Discharge Diagnoses: Principal Problem:   AKI (acute kidney injury) [facility]) Active Problems:   Essential hypertension   Generalized weakness   Fall at home, initial encounter   Chronic diastolic CHF (congestive heart failure) ([place])   Acute cystitis      Hospital Course: [name] is a 89 y.o. F with HTN, dCHF who presented with fall.  Patient first noticed to be unwell around Christmas.  Family had concerns, brought her to the ER one week prior to admission, diagnosed with UTI, discharged with Bactrim.  Took only 2 doses prior to falling again, and family brought to ER where she was found to have elevated creatinine.    * AKI (acute kidney injury) ([place]) Pre-renal.  1.49 on admission, improved to baseline 0.8-0.9 with fluids.  Likely due to poor intake in setting of recent UTI.      Acute cystitis Macrobid for 7 days, to complete on 1/20.  Chronic diastolic CHF (congestive heart failure) (H[facility]) Not on diuretic at baseline.  Appeared euvolemic in the hospital.  Cognitive impairment Family are concerned patient may have memory loss that she was concealing from them.  At baseline she is on dextroamphetamine, which (from the [facility]) she appears to take about every other day or every third day.  This was held in the hospital and she did well.  Given her age and hypertension and frailty, I have concerns about off-label use of this medication and will defer resumption to SNF MD or PCP.  Fall at home, initial encounter Due to weakness due to dehydration.    Generalized weakness Due to  dehydration  Essential hypertension BP normal - Continue home lisinopril            The [facility] was reviewed for this patient prior to discharge.   Consultants: None Procedures performed: None  Disposition: Skilled nursing facility Diet recommendation: Regular   DISCHARGE MEDICATION: Allergies as of 01/12/2023       Reactions   Amoxicillin Diarrhea, Nausea And Vomiting, Other (See Comments)   c-diff from antibiotics 2009   Ciprofloxacin Diarrhea, Nausea And Vomiting, Other (See Comments)   c-diff from antibiotics 2009   Clindamycin/lincomycin Diarrhea, Nausea And Vomiting, Other (See Comments)   c-diff from antiobiotics 2009   Codeine Nausea And Vomiting   Penicillins Hives, Swelling, Rash, Other (See Comments)   Has patient had a PCN reaction causing immediate rash, facial/tongue/throat swelling, SOB or lightheadedness with hypotension: swelling of arm, and rash Has patient had a PCN reaction causing severe rash involving mucus membranes or skin necrosis: unknown Has patient had a PCN reaction that required hospitalization : unknown Has patient had a PCN reaction occurring within the last 10 years: no If all of the above answers are "NO", then may proceed with Cephalosporin use.   Tetanus Toxoids Hives   Reaction many years ago   Atorvastatin Diarrhea   Ultracet [tramadol-acetaminophen] Nausea And Vomiting        Medication List     STOP taking these medications    dextroamphetamine 5 MG tablet Commonly known as: DEXTROSTAT  ibuprofen 200 MG tablet Commonly known as: ADVIL   sulfamethoxazole-trimethoprim 800-160 MG tablet Commonly known as: BACTRIM DS       TAKE these medications    calcium carbonate 750 MG chewable tablet Commonly known as: TUMS EX Chew 2 tablets by mouth daily.   cyanocobalamin 1000 MCG tablet Commonly known as: VITAMIN B12 Take 1,000 mcg by mouth daily.   fluticasone 50 MCG/ACT nasal  spray Commonly known as: FLONASE Place 2 sprays into both nostrils daily.   Hair/Skin/Nails/Biotin Tabs Take 1 tablet by mouth daily.   lisinopril 10 MG tablet Commonly known as: ZESTRIL Take 10 mg by mouth daily.   Lutein 6 MG Caps Take 6 mg by mouth daily.   nitrofurantoin (macrocrystal-monohydrate) 100 MG capsule Commonly known as: MACROBID Take 1 capsule (100 mg total) by mouth every 12 (twelve) hours for 4 days.   SYSTANE OP Place 1 drop into both eyes 3 (three) times daily.   Vitamin D-3 125 MCG (5000 UT) Tabs Take 5,000 Units by mouth daily.           Discharge Exam: Filed Weights   01/12/23 0413  Weight: 48.5 kg    General: Pt is alert, awake, not in acute distress, hard of hearing, sitting up in recliner, feeding herself breakfast. Cardiovascular: RRR, nl S1-S2, no murmurs appreciated.    Respiratory: Normal respiratory rate and rhythm.  Kyphotic, respiratory effort impaired, lungs diminished.      Condition at discharge: stable  The results of significant diagnostics from this hospitalization (including imaging, microbiology, ancillary and laboratory) are listed below for reference.   Imaging Studies: CT Cervical Spine Wo Contrast  Result Date: 01/07/2023 CLINICAL DATA:  Fall head trauma EXAM: CT HEAD WITHOUT CONTRAST CT CERVICAL SPINE WITHOUT CONTRAST TECHNIQUE: Multidetector CT imaging of the head and cervical spine was performed following the standard protocol without intravenous contrast. Multiplanar CT image reconstructions of the cervical spine were also generated. RADIATION DOSE REDUCTION: This exam was performed according to the departmental dose-optimization program which includes automated exposure control, adjustment of the mA and/or kV according to patient size and/or use of iterative reconstruction technique. COMPARISON:  CT brain 12/31/2022, CT brain and cervical spine 08/02/2017 FINDINGS: CT HEAD FINDINGS Brain: No acute territorial  infarction, hemorrhage or intracranial mass. Small chronic left frontal lobe infarct. Small chronic cerebellar infarcts. Atrophy. Advanced chronic small vessel ischemic changes of the white matter. Stable ventricle size. Vascular: No hyperdense vessels.  Carotid vascular calcification. Skull: Normal. Negative for fracture or focal lesion. Sinuses/Orbits: No acute finding. Other: None CT CERVICAL SPINE FINDINGS Alignment: No subluxation.  Facet alignment within normal limits. Skull base and vertebrae: No acute fracture. No primary bone lesion or focal pathologic process. Soft tissues and spinal canal: No prevertebral fluid or swelling. No visible canal hematoma. Disc levels: Multilevel degenerative change. Mild disc space narrowing at C4-C5 and C5-C6 with moderate severe disc space narrowing C6-C7 and C7-T1. facet degenerative changes at multiple levels with foraminal narrowing Upper chest: Negative. Other: None IMPRESSION: 1. No CT evidence for acute intracranial abnormality. Atrophy and chronic small vessel ischemic changes of the white matter. Chronic left frontal and cerebellar infarcts. 2. Degenerative changes of the cervical spine. No acute osseous abnormality. Electronically Signed   By: Donavan Foil M.D.   On: 01/07/2023 21:40   CT HEAD WO CONTRAST (5MM)  Result Date: 01/07/2023 CLINICAL DATA:  Fall head trauma EXAM: CT HEAD WITHOUT CONTRAST CT CERVICAL SPINE WITHOUT CONTRAST TECHNIQUE: Multidetector CT imaging of the head  and cervical spine was performed following the standard protocol without intravenous contrast. Multiplanar CT image reconstructions of the cervical spine were also generated. RADIATION DOSE REDUCTION: This exam was performed according to the departmental dose-optimization program which includes automated exposure control, adjustment of the mA and/or kV according to patient size and/or use of iterative reconstruction technique. COMPARISON:  CT brain 12/31/2022, CT brain and cervical  spine 08/02/2017 FINDINGS: CT HEAD FINDINGS Brain: No acute territorial infarction, hemorrhage or intracranial mass. Small chronic left frontal lobe infarct. Small chronic cerebellar infarcts. Atrophy. Advanced chronic small vessel ischemic changes of the white matter. Stable ventricle size. Vascular: No hyperdense vessels.  Carotid vascular calcification. Skull: Normal. Negative for fracture or focal lesion. Sinuses/Orbits: No acute finding. Other: None CT CERVICAL SPINE FINDINGS Alignment: No subluxation.  Facet alignment within normal limits. Skull base and vertebrae: No acute fracture. No primary bone lesion or focal pathologic process. Soft tissues and spinal canal: No prevertebral fluid or swelling. No visible canal hematoma. Disc levels: Multilevel degenerative change. Mild disc space narrowing at C4-C5 and C5-C6 with moderate severe disc space narrowing C6-C7 and C7-T1. facet degenerative changes at multiple levels with foraminal narrowing Upper chest: Negative. Other: None IMPRESSION: 1. No CT evidence for acute intracranial abnormality. Atrophy and chronic small vessel ischemic changes of the white matter. Chronic left frontal and cerebellar infarcts. 2. Degenerative changes of the cervical spine. No acute osseous abnormality. Electronically Signed   By: Donavan Foil M.D.   On: 01/07/2023 21:40   DG Forearm Left  Result Date: 12/31/2022 CLINICAL DATA:  Fall, pain EXAM: LEFT FOREARM - 2 VIEW COMPARISON:  None Available. FINDINGS: Osseous structures are osteopenic. Prior ORIF of distal radius. Old healed fracture distal ulna. No acute fracture, dislocation or subluxation. No osteolytic or osteoblastic changes. IMPRESSION: Chronic posttraumatic changes.  No acute findings. Electronically Signed   By: Sammie Bench M.D.   On: 12/31/2022 18:10   DG Femur Min 2 Views Right  Result Date: 12/31/2022 CLINICAL DATA:  Fall. EXAM: DG HIP (WITH OR WITHOUT PELVIS) 2-3V RIGHT; RIGHT FEMUR 2 VIEWS; RIGHT TIBIA  AND FIBULA - 2 VIEW COMPARISON:  Sacrum and coccyx radiographs 08/04/2018, right hip radiographs 08/24/2016, FINDINGS: There is diffuse decreased bone mineralization. Right hip: Mild-to-moderate bilateral superomedial femoroacetabular joint space narrowing. Minimal irregularity is unchanged related to old healed right superior pubic ramus fracture. No acute fracture or dislocation. Right femur: Severe patellofemoral joint space narrowing and peripheral osteophytosis. Moderate medial and lateral compartment of the knee joint space narrowing and peripheral osteophytosis. Small knee joint effusion. No acute fracture is seen. No dislocation. Right tibia and fibula: Normal alignment at the ankle.  No acute fracture or dislocation. Mild-to-moderate vascular calcifications. IMPRESSION: 1. No acute fracture. 2. Severe patellofemoral and moderate medial and lateral compartment of the knee osteoarthritis. 3. 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 Electronically Signed   By: Yvonne Kendall M.D.   On: 12/31/2022 13:09   DG Tibia/Fibula Right  Result Date: 12/31/2022 CLINICAL DATA:  Fall. EXAM: DG HIP (WITH OR WITHOUT PELVIS) 2-3V RIGHT; RIGHT FEMUR 2 VIEWS; RIGHT TIBIA AND FIBULA - 2 VIEW COMPARISON:  Sacrum and coccyx radiographs 08/04/2018, right hip radiographs 08/24/2016, FINDINGS: There is diffuse decreased bone mineralization. Right hip: Mild-to-moderate bilateral superomedial femoroacetabular joint space narrowing. Minimal irregularity is unchanged related to old healed right superior pubic ramus fracture. No acute fracture or dislocation. Right femur: Severe patellofemoral joint space narrowing and peripheral osteophytosis. Moderate medial and lateral compartment of the knee joint space narrowing  and peripheral osteophytosis. Small knee joint effusion. No acute fracture is seen. No dislocation. Right tibia and fibula: Normal alignment at the ankle.  No acute fracture or dislocation.  Mild-to-moderate vascular calcifications. IMPRESSION: 1. No acute fracture. 2. Severe patellofemoral and moderate medial and lateral compartment of the knee osteoarthritis. 3. 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 Electronically Signed   By: Yvonne Kendall M.D.   On: 12/31/2022 13:09   DG Hip Unilat W or Wo Pelvis 2-3 Views Right  Result Date: 12/31/2022 CLINICAL DATA:  Fall. EXAM: DG HIP (WITH OR WITHOUT PELVIS) 2-3V RIGHT; RIGHT FEMUR 2 VIEWS; RIGHT TIBIA AND FIBULA - 2 VIEW COMPARISON:  Sacrum and coccyx radiographs 08/04/2018, right hip radiographs 08/24/2016, FINDINGS: There is diffuse decreased bone mineralization. Right hip: Mild-to-moderate bilateral superomedial femoroacetabular joint space narrowing. Minimal irregularity is unchanged related to old healed right superior pubic ramus fracture. No acute fracture or dislocation. Right femur: Severe patellofemoral joint space narrowing and peripheral osteophytosis. Moderate medial and lateral compartment of the knee joint space narrowing and peripheral osteophytosis. Small knee joint effusion. No acute fracture is seen. No dislocation. Right tibia and fibula: Normal alignment at the ankle.  No acute fracture or dislocation. Mild-to-moderate vascular calcifications. IMPRESSION: 1. No acute fracture. 2. Severe patellofemoral and moderate medial and lateral compartment of the knee osteoarthritis. 3. 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 Electronically Signed   By: Yvonne Kendall M.D.   On: 12/31/2022 13:09   CT Head Wo Contrast  Result Date: 12/31/2022 CLINICAL DATA:  Head trauma, minor (Age >= 65y) EXAM: CT HEAD WITHOUT CONTRAST TECHNIQUE: Contiguous axial images were obtained from the base of the skull through the vertex without intravenous contrast. RADIATION DOSE REDUCTION: This exam was performed according to the departmental dose-optimization program which includes automated exposure control, adjustment of the  mA and/or kV according to patient size and/or use of iterative reconstruction technique. COMPARISON:  October 22, 2017 CT head. 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 Small remote infarcts in the cerebellum. No evidence of acute large vascular territory infarct, acute hemorrhage, mass lesion, midline shift or hydrocephalus. Vascular: No hyperdense vessel.  Calcific atherosclerosis. Skull: No acute fracture. Sinuses/Orbits: Inferior right maxillary sinus mucosal thickening. No acute orbital findings. Other: No mastoid effusions. IMPRESSION: No evidence of acute intracranial abnormality. Electronically Signed   By: Margaretha Sheffield M.D.   On: 12/31/2022 12:28    Microbiology: Results for orders placed or performed during the hospital encounter of 12/31/22  Urine Culture     Status: Abnormal   Collection Time: 12/31/22  5:53 PM   Specimen: Urine, Clean Catch  Result Value Ref Range Status   Specimen Description URINE, CLEAN CATCH  Final   Special Requests   Final    NONE Performed at [facility], [place] [address], [place], [place] [address]    Culture >=100,000 COLONIES/mL STAPHYLOCOCCUS AUREUS (A)  Final   Report Status 01/02/2023 FINAL  Final   Organism ID, Bacteria STAPHYLOCOCCUS AUREUS (A)  Final      Susceptibility   Staphylococcus aureus - MIC*    CIPROFLOXACIN <=0.5 SENSITIVE Sensitive     GENTAMICIN <=0.5 SENSITIVE Sensitive     NITROFURANTOIN <=16 SENSITIVE Sensitive     OXACILLIN 0.5 SENSITIVE Sensitive     TETRACYCLINE <=1 SENSITIVE Sensitive     VANCOMYCIN 1 SENSITIVE Sensitive     TRIMETH/SULFA <=10 SENSITIVE Sensitive     CLINDAMYCIN <=0.25 SENSITIVE Sensitive  RIFAMPIN <=0.5 SENSITIVE Sensitive     Inducible Clindamycin NEGATIVE Sensitive     * >=100,000 COLONIES/mL STAPHYLOCOCCUS AUREUS    Labs: CBC: Recent Labs  Lab 01/07/23 2130 01/08/23 0216  WBC 9.2 7.4  NEUTROABS   --  4.6  HGB 12.1 11.7*  HCT 38.8 37.5  MCV 88.4 88.9  PLT 307 585   Basic Metabolic Panel: Recent Labs  Lab 01/07/23 2130 01/08/23 0216 01/09/23 0817 01/11/23 0843  NA 136 135 137 134*  K 4.4 4.3 4.2 4.2  CL 102 106 110 105  CO2 '24 22 22 23  '$ GLUCOSE 126* 100* 95 96  BUN 24* '23 13 20  '$ CREATININE 1.49* 1.33* 0.69 0.99  CALCIUM 9.2 8.6* 8.7* 9.0  MG  --  2.0  --   --   PHOS  --   --  2.3*  --    Liver Function Tests: Recent Labs  Lab 01/08/23 0216 01/09/23 0817  AST 32  --   ALT 19  --   ALKPHOS 33*  --   BILITOT 0.4  --   PROT 6.5  --   ALBUMIN 3.4* 3.4*   CBG: No results for input(s): "GLUCAP" in the last 168 hours.  Discharge time spent: approximately 1 minute spent on discharge counseling, evaluation of patient on day of discharge, and coordination of discharge planning with nursing, social work, pharmacy and case management  Signed: Edwin Dada, MD [facility] 01/12/2023

## 2023-01-12 NOTE — TOC Transition Note (Signed)
Transition of Care [facility]) - CM/SW Discharge Note   Patient Details  Name: [name] MRN: [id number] Date of Birth: [birth year]  Transition of Care [facility] CM/SW Contact:  Angelita Ingles, RN Phone Number:[phone]  01/12/2023, 12:44 PM   Clinical Narrative:    Discharge summary has been faxed to [facility]. Transportation has been arranged per PTAR. Daughter at bedside hand has been updated on discharge. Discharge packet is at nurse station.   Please call report to: [place] Room 207          Patient Goals and CMS Choice      Discharge Placement                         Discharge Plan and Services Additional resources added to the After Visit Summary for                                       Social Determinants of Health (SDOH) Interventions SDOH Screenings   Tobacco Use: Low Risk  (01/08/2023)     Readmission Risk Interventions     No data to display

## 2023-01-14 DIAGNOSIS — N183 Chronic kidney disease, stage 3 unspecified: Secondary | ICD-10-CM | POA: Diagnosis not present

## 2023-01-14 DIAGNOSIS — R531 Weakness: Secondary | ICD-10-CM | POA: Diagnosis not present

## 2023-01-14 DIAGNOSIS — W19XXXA Unspecified fall, initial encounter: Secondary | ICD-10-CM | POA: Diagnosis not present

## 2023-01-14 DIAGNOSIS — I130 Hypertensive heart and chronic kidney disease with heart failure and stage 1 through stage 4 chronic kidney disease, or unspecified chronic kidney disease: Secondary | ICD-10-CM | POA: Diagnosis not present

## 2023-02-28 ENCOUNTER — Inpatient Hospital Stay (HOSPITAL_COMMUNITY): Payer: Medicare Other

## 2023-02-28 ENCOUNTER — Encounter (HOSPITAL_COMMUNITY): Payer: Self-pay

## 2023-02-28 ENCOUNTER — Emergency Department (HOSPITAL_COMMUNITY): Payer: Medicare Other

## 2023-02-28 ENCOUNTER — Inpatient Hospital Stay (HOSPITAL_COMMUNITY)
Admission: EM | Admit: 2023-02-28 | Discharge: 2023-03-02 | DRG: 040 | Disposition: A | Discharge status: Skilled Nursing Facility | Payer: Medicare Other | Source: Skilled Nursing Facility | Attending: Internal Medicine | Admitting: Internal Medicine

## 2023-02-28 ENCOUNTER — Other Ambulatory Visit: Payer: Self-pay

## 2023-02-28 DIAGNOSIS — R1111 Vomiting without nausea: Secondary | ICD-10-CM | POA: Diagnosis not present

## 2023-02-28 DIAGNOSIS — R2681 Unsteadiness on feet: Secondary | ICD-10-CM | POA: Diagnosis not present

## 2023-02-28 DIAGNOSIS — I6340 Cerebral infarction due to embolism of unspecified cerebral artery: Secondary | ICD-10-CM | POA: Diagnosis not present

## 2023-02-28 DIAGNOSIS — H53461 Homonymous bilateral field defects, right side: Secondary | ICD-10-CM | POA: Diagnosis not present

## 2023-02-28 DIAGNOSIS — Z7982 Long term (current) use of aspirin: Secondary | ICD-10-CM

## 2023-02-28 DIAGNOSIS — R197 Diarrhea, unspecified: Secondary | ICD-10-CM

## 2023-02-28 DIAGNOSIS — I10 Essential (primary) hypertension: Secondary | ICD-10-CM | POA: Diagnosis not present

## 2023-02-28 DIAGNOSIS — E785 Hyperlipidemia, unspecified: Secondary | ICD-10-CM | POA: Diagnosis not present

## 2023-02-28 DIAGNOSIS — I6389 Other cerebral infarction: Secondary | ICD-10-CM | POA: Diagnosis not present

## 2023-02-28 DIAGNOSIS — M13861 Other specified arthritis, right knee: Secondary | ICD-10-CM | POA: Diagnosis not present

## 2023-02-28 DIAGNOSIS — I63412 Cerebral infarction due to embolism of left middle cerebral artery: Secondary | ICD-10-CM | POA: Diagnosis not present

## 2023-02-28 DIAGNOSIS — D72829 Elevated white blood cell count, unspecified: Secondary | ICD-10-CM | POA: Diagnosis not present

## 2023-02-28 DIAGNOSIS — Z1152 Encounter for screening for COVID-19: Secondary | ICD-10-CM | POA: Diagnosis not present

## 2023-02-28 DIAGNOSIS — Z885 Allergy status to narcotic agent status: Secondary | ICD-10-CM

## 2023-02-28 DIAGNOSIS — R4182 Altered mental status, unspecified: Secondary | ICD-10-CM | POA: Diagnosis not present

## 2023-02-28 DIAGNOSIS — Z66 Do not resuscitate: Secondary | ICD-10-CM | POA: Diagnosis present

## 2023-02-28 DIAGNOSIS — Z880 Allergy status to penicillin: Secondary | ICD-10-CM | POA: Diagnosis not present

## 2023-02-28 DIAGNOSIS — F0390 Unspecified dementia without behavioral disturbance: Secondary | ICD-10-CM

## 2023-02-28 DIAGNOSIS — R404 Transient alteration of awareness: Secondary | ICD-10-CM | POA: Diagnosis not present

## 2023-02-28 DIAGNOSIS — D649 Anemia, unspecified: Secondary | ICD-10-CM | POA: Diagnosis not present

## 2023-02-28 DIAGNOSIS — I63433 Cerebral infarction due to embolism of bilateral posterior cerebral arteries: Principal | ICD-10-CM | POA: Diagnosis present

## 2023-02-28 DIAGNOSIS — E871 Hypo-osmolality and hyponatremia: Secondary | ICD-10-CM | POA: Diagnosis not present

## 2023-02-28 DIAGNOSIS — Z823 Family history of stroke: Secondary | ICD-10-CM | POA: Diagnosis not present

## 2023-02-28 DIAGNOSIS — I639 Cerebral infarction, unspecified: Secondary | ICD-10-CM | POA: Diagnosis not present

## 2023-02-28 DIAGNOSIS — Z820 Family history of epilepsy and other diseases of the nervous system: Secondary | ICD-10-CM | POA: Diagnosis not present

## 2023-02-28 DIAGNOSIS — Z888 Allergy status to other drugs, medicaments and biological substances status: Secondary | ICD-10-CM

## 2023-02-28 DIAGNOSIS — K047 Periapical abscess without sinus: Secondary | ICD-10-CM | POA: Diagnosis not present

## 2023-02-28 DIAGNOSIS — R29702 NIHSS score 2: Secondary | ICD-10-CM | POA: Diagnosis not present

## 2023-02-28 DIAGNOSIS — R278 Other lack of coordination: Secondary | ICD-10-CM | POA: Diagnosis not present

## 2023-02-28 DIAGNOSIS — R55 Syncope and collapse: Secondary | ICD-10-CM | POA: Diagnosis not present

## 2023-02-28 DIAGNOSIS — Z8673 Personal history of transient ischemic attack (TIA), and cerebral infarction without residual deficits: Secondary | ICD-10-CM | POA: Diagnosis not present

## 2023-02-28 DIAGNOSIS — I611 Nontraumatic intracerebral hemorrhage in hemisphere, cortical: Secondary | ICD-10-CM | POA: Diagnosis present

## 2023-02-28 DIAGNOSIS — Z8249 Family history of ischemic heart disease and other diseases of the circulatory system: Secondary | ICD-10-CM

## 2023-02-28 DIAGNOSIS — H04123 Dry eye syndrome of bilateral lacrimal glands: Secondary | ICD-10-CM | POA: Diagnosis not present

## 2023-02-28 DIAGNOSIS — R112 Nausea with vomiting, unspecified: Secondary | ICD-10-CM | POA: Diagnosis not present

## 2023-02-28 DIAGNOSIS — M810 Age-related osteoporosis without current pathological fracture: Secondary | ICD-10-CM | POA: Diagnosis present

## 2023-02-28 DIAGNOSIS — I63233 Cerebral infarction due to unspecified occlusion or stenosis of bilateral carotid arteries: Secondary | ICD-10-CM | POA: Diagnosis not present

## 2023-02-28 DIAGNOSIS — Z7401 Bed confinement status: Secondary | ICD-10-CM | POA: Diagnosis not present

## 2023-02-28 DIAGNOSIS — M6281 Muscle weakness (generalized): Secondary | ICD-10-CM | POA: Diagnosis not present

## 2023-02-28 DIAGNOSIS — M25552 Pain in left hip: Secondary | ICD-10-CM | POA: Diagnosis not present

## 2023-02-28 DIAGNOSIS — K529 Noninfective gastroenteritis and colitis, unspecified: Secondary | ICD-10-CM | POA: Diagnosis not present

## 2023-02-28 DIAGNOSIS — J309 Allergic rhinitis, unspecified: Secondary | ICD-10-CM | POA: Diagnosis not present

## 2023-02-28 DIAGNOSIS — M13852 Other specified arthritis, left hip: Secondary | ICD-10-CM | POA: Diagnosis not present

## 2023-02-28 DIAGNOSIS — R110 Nausea: Secondary | ICD-10-CM | POA: Diagnosis not present

## 2023-02-28 LAB — URINALYSIS, ROUTINE W REFLEX MICROSCOPIC
Bacteria, UA: NONE SEEN
Bilirubin Urine: NEGATIVE
Glucose, UA: NEGATIVE mg/dL
Hgb urine dipstick: NEGATIVE
Ketones, ur: NEGATIVE mg/dL
Nitrite: NEGATIVE
Protein, ur: NEGATIVE mg/dL
Specific Gravity, Urine: 1.012 (ref 1.005–1.030)
pH: 7 (ref 5.0–8.0)

## 2023-02-28 LAB — COMPREHENSIVE METABOLIC PANEL
ALT: 14 U/L (ref 0–44)
AST: 35 U/L (ref 15–41)
Albumin: 2.8 g/dL — ABNORMAL LOW (ref 3.5–5.0)
Alkaline Phosphatase: 51 U/L (ref 38–126)
Anion gap: 5 (ref 5–15)
BUN: 20 mg/dL (ref 8–23)
CO2: 24 mmol/L (ref 22–32)
Calcium: 8.8 mg/dL — ABNORMAL LOW (ref 8.9–10.3)
Chloride: 102 mmol/L (ref 98–111)
Creatinine, Ser: 0.87 mg/dL (ref 0.44–1.00)
GFR, Estimated: >60 mL/min (ref >60)
Glucose, Bld: 146 mg/dL — ABNORMAL HIGH (ref 70–99)
Potassium: 4.6 mmol/L (ref 3.5–5.1)
Sodium: 131 mmol/L — ABNORMAL LOW (ref 135–145)
Total Bilirubin: 0.9 mg/dL (ref 0.3–1.2)
Total Protein: 6.3 g/dL — ABNORMAL LOW (ref 6.5–8.1)

## 2023-02-28 LAB — CBC WITH DIFFERENTIAL/PLATELET
Abs Immature Granulocytes: 0.16 10*3/uL — ABNORMAL HIGH (ref 0.00–0.07)
Basophils Absolute: 0.1 10*3/uL (ref 0.0–0.1)
Basophils Relative: 0 %
Eosinophils Absolute: 0.2 10*3/uL (ref 0.0–0.5)
Eosinophils Relative: 1 %
HCT: 36 % (ref 36.0–46.0)
Hemoglobin: 11.3 g/dL — ABNORMAL LOW (ref 12.0–15.0)
Immature Granulocytes: 1 %
Lymphocytes Relative: 2 %
Lymphs Abs: 0.4 10*3/uL — ABNORMAL LOW (ref 0.7–4.0)
MCH: 27.9 pg (ref 26.0–34.0)
MCHC: 31.4 g/dL (ref 30.0–36.0)
MCV: 88.9 fL (ref 80.0–100.0)
Monocytes Absolute: 1.5 10*3/uL — ABNORMAL HIGH (ref 0.1–1.0)
Monocytes Relative: 7 %
Neutro Abs: 20 10*3/uL — ABNORMAL HIGH (ref 1.7–7.7)
Neutrophils Relative %: 89 %
Platelets: 364 10*3/uL (ref 150–400)
RBC: 4.05 MIL/uL (ref 3.87–5.11)
RDW: 14 % (ref 11.5–15.5)
WBC: 22.4 10*3/uL — ABNORMAL HIGH (ref 4.0–10.5)
nRBC: 0 % (ref 0.0–0.2)

## 2023-02-28 LAB — LIPASE, BLOOD: Lipase: 35 U/L (ref 11–51)

## 2023-02-28 LAB — CBG MONITORING, ED: Glucose-Capillary: 141 mg/dL — ABNORMAL HIGH (ref 70–99)

## 2023-02-28 LAB — RESP PANEL BY RT-PCR (RSV, FLU A&B, COVID)  RVPGX2
Influenza A by PCR: NEGATIVE
Influenza B by PCR: NEGATIVE
Resp Syncytial Virus by PCR: NEGATIVE
SARS Coronavirus 2 by RT PCR: NEGATIVE

## 2023-02-28 LAB — LACTIC ACID, PLASMA: Lactic Acid, Venous: 0.8 mmol/L (ref 0.5–1.9)

## 2023-02-28 MED ORDER — STROKE: EARLY STAGES OF RECOVERY BOOK
Freq: Once | Status: AC
  Filled 2023-02-28: qty 1

## 2023-02-28 MED ORDER — POLYVINYL ALCOHOL 1.4 % OP SOLN
1.0000 [drp] | Freq: Three times a day (TID) | OPHTHALMIC | Status: DC
  Administered 2023-02-28 – 2023-03-02 (×4): 1 [drp] via OPHTHALMIC
  Filled 2023-02-28: qty 15

## 2023-02-28 MED ORDER — ENOXAPARIN SODIUM 30 MG/0.3ML IJ SOSY
30.0000 mg | PREFILLED_SYRINGE | INTRAMUSCULAR | Status: DC
  Administered 2023-02-28 – 2023-03-01 (×2): 30 mg via SUBCUTANEOUS
  Filled 2023-02-28 (×2): qty 0.3

## 2023-02-28 MED ORDER — METRONIDAZOLE 500 MG PO TABS
500.0000 mg | ORAL_TABLET | Freq: Two times a day (BID) | ORAL | Status: DC
  Administered 2023-02-28 – 2023-03-02 (×4): 500 mg via ORAL
  Filled 2023-02-28 (×5): qty 1

## 2023-02-28 MED ORDER — IOHEXOL 350 MG/ML SOLN
75.0000 mL | Freq: Once | INTRAVENOUS | Status: AC | PRN
  Administered 2023-02-28: 75 mL via INTRAVENOUS

## 2023-02-28 MED ORDER — ASPIRIN 81 MG PO CHEW
81.0000 mg | CHEWABLE_TABLET | Freq: Every day | ORAL | Status: DC
  Administered 2023-02-28 – 2023-03-02 (×3): 81 mg via ORAL
  Filled 2023-02-28 (×3): qty 1

## 2023-02-28 MED ORDER — ACETAMINOPHEN 325 MG PO TABS: 650.0000 mg | ORAL_TABLET | ORAL | Status: DC | PRN

## 2023-02-28 MED ORDER — FLUTICASONE PROPIONATE 50 MCG/ACT NA SUSP
2.0000 | Freq: Every day | NASAL | Status: DC
  Administered 2023-03-01 – 2023-03-02 (×2): 2 via NASAL
  Filled 2023-02-28: qty 16

## 2023-02-28 MED ORDER — GADOBUTROL 1 MMOL/ML IV SOLN
5.0000 mL | Freq: Once | INTRAVENOUS | Status: AC | PRN
  Administered 2023-02-28: 5 mL via INTRAVENOUS

## 2023-02-28 MED ORDER — MELATONIN 3 MG PO TABS
3.0000 mg | ORAL_TABLET | Freq: Every day | ORAL | Status: DC
  Administered 2023-02-28 – 2023-03-01 (×2): 3 mg via ORAL
  Filled 2023-02-28 (×2): qty 1

## 2023-02-28 MED ORDER — SACCHAROMYCES BOULARDII 250 MG PO CAPS
250.0000 mg | ORAL_CAPSULE | Freq: Two times a day (BID) | ORAL | Status: DC
  Administered 2023-02-28 – 2023-03-02 (×4): 250 mg via ORAL
  Filled 2023-02-28 (×4): qty 1

## 2023-02-28 MED ORDER — SODIUM CHLORIDE 0.9 % IV BOLUS
1000.0000 mL | Freq: Once | INTRAVENOUS | Status: AC
  Administered 2023-02-28: 1000 mL via INTRAVENOUS

## 2023-02-28 MED ORDER — POLYETHYL GLYCOL-PROPYL GLYCOL 0.4-0.3 % OP GEL
Freq: Three times a day (TID) | OPHTHALMIC | Status: DC
  Filled 2023-02-28: qty 10

## 2023-02-28 MED ORDER — ACETAMINOPHEN 650 MG RE SUPP: 650.0000 mg | RECTAL | Status: DC | PRN

## 2023-02-28 MED ORDER — SODIUM CHLORIDE 0.9 % IV SOLN: INTRAVENOUS | Status: AC

## 2023-02-28 MED ORDER — ACETAMINOPHEN 160 MG/5ML PO SOLN: 650.0000 mg | ORAL | Status: DC | PRN

## 2023-02-28 MED ORDER — CEFDINIR 300 MG PO CAPS
300.0000 mg | ORAL_CAPSULE | Freq: Two times a day (BID) | ORAL | Status: DC
  Administered 2023-02-28 – 2023-03-02 (×4): 300 mg via ORAL
  Filled 2023-02-28 (×5): qty 1

## 2023-02-28 NOTE — ED Notes (Signed)
Patient transported to X-ray 

## 2023-02-28 NOTE — ED Notes (Signed)
ED TO INPATIENT HANDOFF REPORT  ED Nurse Name and Phone #: Nimesh Riolo [phone]  S Name/Age/Gender [name] 89 y.o. female Room/Bed: 028C/028C  Code Status   Code Status: DNR  Home/SNF/Other Nursing Home Patient oriented to: self and place Is this baseline? No   Triage Complete: Triage complete  Chief Complaint CVA (cerebral vascular accident) [facility]) [I63.9]  Triage Note Patient BIB [facility] from SNF ([facility]). Patient had witnessed syncopal event with SNF staff, no fall, no blood thinners. Patient began having N/V/D x 1 hour.   EMS administered 500 mL NS and 4 mg Zofran.    Allergies Allergies  Allergen Reactions   Amoxicillin Diarrhea, Nausea And Vomiting and Other (See Comments)    c-diff from antibiotics 2009   Ciprofloxacin Diarrhea, Nausea And Vomiting and Other (See Comments)    c-diff from antibiotics 2009   Clindamycin/Lincomycin Diarrhea, Nausea And Vomiting and Other (See Comments)    c-diff from antiobiotics 2009   Codeine Nausea And Vomiting   Penicillins Hives, Swelling, Rash and Other (See Comments)    Has patient had a PCN reaction causing immediate rash, facial/tongue/throat swelling, SOB or lightheadedness with hypotension: swelling of arm, and rash Has patient had a PCN reaction causing severe rash involving mucus membranes or skin necrosis: unknown Has patient had a PCN reaction that required hospitalization : unknown Has patient had a PCN reaction occurring within the last 10 years: no If all of the above answers are "NO", then may proceed with Cephalosporin use.    Tetanus Toxoids Hives    Reaction many years ago   Atorvastatin Diarrhea   Ultracet [Tramadol-Acetaminophen] Nausea And Vomiting    Level of Care/Admitting Diagnosis ED Disposition     ED Disposition  Admit   Condition  --   [place]: [place] [[id number]]  Level of Care: Telemetry Medical [104]  May admit patient to [facility] or [facility]  if equivalent level of care is available:: No  Covid Evaluation: Asymptomatic - no recent exposure (last 10 days) testing not required  Diagnosis: CVA (cerebral vascular accident) [facility]) [id number]  Admitting Physician: Norval Morton [id number]  Attending Physician: Norval Morton [id number]  Certification:: I certify this patient will need inpatient services for at least 2 midnights  Estimated Length of Stay: 2          B Medical/Surgery History Past Medical History:  Diagnosis Date   Benign positional vertigo    Bursitis started 10-2011   left leg and left hip   Clostridium difficile diarrhea    hx of 10 years ago   DJD (degenerative joint disease)    Family history of adverse reaction to anesthesia    daughter has nausea   Hypertension    Leg cramps    Osteoporosis    Pneumonia    as a baby   PONV (postoperative nausea and vomiting)    Stroke ([place])    Varicose veins    Past Surgical History:  Procedure Laterality Date   ABDOMINAL HYSTERECTOMY     APPENDECTOMY     CATARACT EXTRACTION     right   COLONOSCOPY     HERNIA REPAIR     KYPHOSIS SURGERY     ORIF WRIST FRACTURE Left 12/24/2016   Procedure: OPEN REDUCTION INTERNAL FIXATION (ORIF) LEFT WRIST FRACTURE;  Surgeon: Roseanne Kaufman, MD;  Location: [facility];  Service: Orthopedics;  Laterality: Left;   TUBAL LIGATION     bilateral  A IV Location/Drains/Wounds Patient Lines/Drains/Airways Status     Active Line/Drains/Airways     Name Placement date Placement time Site Days   Peripheral IV 02/28/23 Anterior;Left Forearm 02/28/23  0623  Forearm  less than 1   Wound / Incision (Open or Dehisced) 08/02/17 Laceration Finger (Comment which one) Left 4 sutures, well approximated 08/02/17  2200  Finger (Comment which one)  2036            Intake/Output Last 24 hours No intake or output data in the 24 hours ending 02/28/23 1329  Labs/Imaging Results for orders placed or performed during the hospital  encounter of 02/28/23 (from the past 48 hour(s))  Comprehensive metabolic panel     Status: Abnormal   Collection Time: 02/28/23  6:45 AM  Result Value Ref Range   Sodium 131 (L) 135 - 145 mmol/L   Potassium 4.6 3.5 - 5.1 mmol/L    Comment: HEMOLYSIS AT THIS LEVEL MAY AFFECT RESULT   Chloride 102 98 - 111 mmol/L   CO2 24 22 - 32 mmol/L   Glucose, Bld 146 (H) 70 - 99 mg/dL    Comment: Glucose reference range applies only to samples taken after fasting for at least 8 hours.   BUN 20 8 - 23 mg/dL   Creatinine, Ser 0.87 0.44 - 1.00 mg/dL   Calcium 8.8 (L) 8.9 - 10.3 mg/dL   Total Protein 6.3 (L) 6.5 - 8.1 g/dL   Albumin 2.8 (L) 3.5 - 5.0 g/dL   AST 35 15 - 41 U/L    Comment: HEMOLYSIS AT THIS LEVEL MAY AFFECT RESULT   ALT 14 0 - 44 U/L    Comment: HEMOLYSIS AT THIS LEVEL MAY AFFECT RESULT   Alkaline Phosphatase 51 38 - 126 U/L   Total Bilirubin 0.9 0.3 - 1.2 mg/dL    Comment: HEMOLYSIS AT THIS LEVEL MAY AFFECT RESULT   GFR, Estimated >60 >60 mL/min    Comment: (NOTE) Calculated using the CKD-EPI Creatinine Equation (2021)    Anion gap 5 5 - 15    Comment: Performed at [facility], [place] [address], [place], [place] [address]  CBC with Differential     Status: Abnormal   Collection Time: 02/28/23  6:45 AM  Result Value Ref Range   WBC 22.4 (H) 4.0 - 10.5 K/uL   RBC 4.05 3.87 - 5.11 MIL/uL   Hemoglobin 11.3 (L) 12.0 - 15.0 g/dL   HCT 36.0 36.0 - 46.0 %   MCV 88.9 80.0 - 100.0 fL   MCH 27.9 26.0 - 34.0 pg   MCHC 31.4 30.0 - 36.0 g/dL   RDW 14.0 11.5 - 15.5 %   Platelets 364 150 - 400 K/uL   nRBC 0.0 0.0 - 0.2 %   Neutrophils Relative % 89 %   Neutro Abs 20.0 (H) 1.7 - 7.7 K/uL   Lymphocytes Relative 2 %   Lymphs Abs 0.4 (L) 0.7 - 4.0 K/uL   Monocytes Relative 7 %   Monocytes Absolute 1.5 (H) 0.1 - 1.0 K/uL   Eosinophils Relative 1 %   Eosinophils Absolute 0.2 0.0 - 0.5 K/uL   Basophils Relative 0 %   Basophils Absolute 0.1 0.0 - 0.1 K/uL   Immature Granulocytes 1 %    Abs Immature Granulocytes 0.16 (H) 0.00 - 0.07 K/uL    Comment: Performed at [facility] [address], [place], [place] [address]  Lipase, blood     Status: None   Collection Time:  02/28/23  6:45 AM  Result Value Ref Range   Lipase 35 11 - 51 U/L    Comment: Performed at [facility] [address] [place], [place] [address]  CBG monitoring, ED     Status: Abnormal   Collection Time: 02/28/23  6:53 AM  Result Value Ref Range   Glucose-Capillary 141 (H) 70 - 99 mg/dL    Comment: Glucose reference range applies only to samples taken after fasting for at least 8 hours.   Comment 1 Notify RN    Comment 2 Document in Chart   Resp panel by RT-PCR (RSV, Flu A&B, Covid) Anterior Nasal Swab     Status: None   Collection Time: 02/28/23  6:58 AM   Specimen: Anterior Nasal Swab  Result Value Ref Range   SARS Coronavirus 2 by RT PCR NEGATIVE NEGATIVE   Influenza A by PCR NEGATIVE NEGATIVE   Influenza B by PCR NEGATIVE NEGATIVE    Comment: (NOTE) The Xpert Xpress SARS-CoV-2/FLU/RSV plus assay is intended as an aid in the diagnosis of influenza from Nasopharyngeal swab specimens and should not be used as a sole basis for treatment. Nasal washings and aspirates are unacceptable for Xpert Xpress SARS-CoV-2/FLU/RSV testing.  Fact Sheet for Patients: EntrepreneurPulse.com.au  Fact Sheet for Healthcare Providers: IncredibleEmployment.be  This test is not yet approved or cleared by the [place] FDA and has been authorized for detection and/or diagnosis of SARS-CoV-2 by FDA under an Emergency Use Authorization (EUA). This EUA will remain in effect (meaning this test can be used) for the duration of the COVID-19 declaration under Section 564(b)(1) of the Act, 21 U.S.C. section 360bbb-3(b)(1), unless the authorization is terminated or revoked.     Resp Syncytial Virus by PCR NEGATIVE NEGATIVE    Comment: (NOTE) Fact Sheet for  Patients: EntrepreneurPulse.com.au  Fact Sheet for Healthcare Providers: IncredibleEmployment.be  This test is not yet approved or cleared by the [place] FDA and has been authorized for detection and/or diagnosis of SARS-CoV-2 by FDA under an Emergency Use Authorization (EUA). This EUA will remain in effect (meaning this test can be used) for the duration of the COVID-19 declaration under Section 564(b)(1) of the Act, 21 U.S.C. section 360bbb-3(b)(1), unless the authorization is terminated or revoked.  Performed at [facility], [place] [address], [place], [place] [address]    MR BRAIN W WO CONTRAST  Result Date: 02/28/2023 CLINICAL DATA:  Possible acute infarct on CT. EXAM: MRI HEAD WITHOUT AND WITH CONTRAST TECHNIQUE: Multiplanar, multiecho pulse sequences of the brain and surrounding structures were obtained without and with intravenous contrast. CONTRAST:  7m GADAVIST GADOBUTROL 1 MMOL/ML IV SOLN COMPARISON:  Head CT from earlier today FINDINGS: Brain: Patchy acute infarcts along the right sylvian fissure, subcentimeter. Additional subcentimeter acute infarct in the left parietal cortex. Larger and more confluent area of more weakly restricted diffusion along the left occipital cortex with surface enhancement. Chronic small vessel ischemia in the cerebral white matter that is confluent. Small remote bifrontal cortically based infarcts. Chronic lacune in the left centrum semiovale. Mild for age cerebral volume loss. Scattered microhemorrhages mainly in the deep brain and attributed to ischemic insults. Small chronic bilateral cerebellar infarcts. Vascular: Major flow voids are preserved. Skull and upper cervical spine: No focal marrow lesion. Sinuses/Orbits: No acute finding IMPRESSION: 1. Small acute infarcts in the right perisylvian and left parietal cortex. 2. Moderate subacute infarct in the left occipital cortex. 3. Advanced chronic ischemic  injury with multiple chronic infarcts.  Electronically Signed   By: Jorje Guild M.D.   On: 02/28/2023 10:47   CT Head Wo Contrast  Result Date: 02/28/2023 CLINICAL DATA:  Syncope EXAM: CT HEAD WITHOUT CONTRAST CT CERVICAL SPINE WITHOUT CONTRAST TECHNIQUE: Multidetector CT imaging of the head and cervical spine was performed following the standard protocol without intravenous contrast. Multiplanar CT image reconstructions of the cervical spine were also generated. RADIATION DOSE REDUCTION: This exam was performed according to the departmental dose-optimization program which includes automated exposure control, adjustment of the mA and/or kV according to patient size and/or use of iterative reconstruction technique. COMPARISON:  No comparison studies available. FINDINGS: CT HEAD FINDINGS Brain: Diffuse loss of parenchymal volume is consistent with atrophy. Patchy low attenuation in the deep hemispheric and periventricular white matter is nonspecific, but likely reflects chronic microvascular ischemic demyelination. There is new hypodensity in the left occipital lobe inferiorly. Stable lacunar infarct right basal ganglia. Tiny gas locules in the region of the sella turcica presumably in the cavernous sinuses secondary to IV placement. Vascular: No hyperdense vessel or unexpected calcification. Skull: No evidence for fracture. No worrisome lytic or sclerotic lesion. Sinuses/Orbits: Chronic mucosal thickening noted inferior right maxillary sinus. Visualized portions of the globes and intraorbital fat are unremarkable. Other: None. CT CERVICAL SPINE FINDINGS Alignment: Trace anterolisthesis of C7 on T1. Skull base and vertebrae: No acute fracture. No primary bone lesion or focal pathologic process. Soft tissues and spinal canal: No prevertebral fluid or swelling. No visible canal hematoma. Disc levels: Loss of disc height with endplate degeneration noted C6-7. Upper chest: Biapical pleuroparenchymal scarring. Other:  None IMPRESSION: 1. New hypodensity in the left occipital lobe inferiorly. This could be related to acute or subacute infarct. Vasogenic edema considered less likely but not excluded. MRI of the brain without and with contrast recommended to further evaluate. 2. Atrophy with chronic small vessel ischemic disease. 3. No evidence for cervical spine fracture or traumatic subluxation. Electronically Signed   By: Misty Stanley M.D.   On: 02/28/2023 08:43   CT Cervical Spine Wo Contrast  Result Date: 02/28/2023 CLINICAL DATA:  Syncope EXAM: CT HEAD WITHOUT CONTRAST CT CERVICAL SPINE WITHOUT CONTRAST TECHNIQUE: Multidetector CT imaging of the head and cervical spine was performed following the standard protocol without intravenous contrast. Multiplanar CT image reconstructions of the cervical spine were also generated. RADIATION DOSE REDUCTION: This exam was performed according to the departmental dose-optimization program which includes automated exposure control, adjustment of the mA and/or kV according to patient size and/or use of iterative reconstruction technique. COMPARISON:  No comparison studies available. FINDINGS: CT HEAD FINDINGS Brain: Diffuse loss of parenchymal volume is consistent with atrophy. Patchy low attenuation in the deep hemispheric and periventricular white matter is nonspecific, but likely reflects chronic microvascular ischemic demyelination. There is new hypodensity in the left occipital lobe inferiorly. Stable lacunar infarct right basal ganglia. Tiny gas locules in the region of the sella turcica presumably in the cavernous sinuses secondary to IV placement. Vascular: No hyperdense vessel or unexpected calcification. Skull: No evidence for fracture. No worrisome lytic or sclerotic lesion. Sinuses/Orbits: Chronic mucosal thickening noted inferior right maxillary sinus. Visualized portions of the globes and intraorbital fat are unremarkable. Other: None. CT CERVICAL SPINE FINDINGS Alignment:  Trace anterolisthesis of C7 on T1. Skull base and vertebrae: No acute fracture. No primary bone lesion or focal pathologic process. Soft tissues and spinal canal: No prevertebral fluid or swelling. No visible canal hematoma. Disc levels: Loss of disc height with endplate degeneration noted  C6-7. Upper chest: Biapical pleuroparenchymal scarring. Other: None IMPRESSION: 1. New hypodensity in the left occipital lobe inferiorly. This could be related to acute or subacute infarct. Vasogenic edema considered less likely but not excluded. MRI of the brain without and with contrast recommended to further evaluate. 2. Atrophy with chronic small vessel ischemic disease. 3. No evidence for cervical spine fracture or traumatic subluxation. Electronically Signed   By: Misty Stanley M.D.   On: 02/28/2023 08:43   DG Chest 2 View  Result Date: 02/28/2023 CLINICAL DATA:  Syncope. EXAM: CHEST - 2 VIEW COMPARISON:  05/14/2020 FINDINGS: Normal heart size and mediastinal contours accounting for leftward rotation. No acute infiltrate or edema. No effusion or pneumothorax. No acute osseous findings. Remote lower thoracic compression fracture with cement augmentation. IMPRESSION: No evidence of acute disease. Electronically Signed   By: Jorje Guild M.D.   On: 02/28/2023 08:12    Pending Labs Unresulted Labs (From admission, onward)     Start     Ordered   02/28/23 1216  Lipid panel  (Labs)  Add-on,   AD       Comments: Fasting    02/28/23 1216   02/28/23 1216  Hemoglobin A1c  (Labs)  Add-on,   AD       Comments: To assess prior glycemic control    02/28/23 1216   02/28/23 1215  Culture, blood (single) w Reflex to ID Panel  Once,   R        02/28/23 1214   02/28/23 1157  Lactic acid, plasma  Add-on,   AD        02/28/23 1159   02/28/23 0658  Urinalysis, Routine w reflex microscopic -Urine, Clean Catch  Once,   URGENT       Question:  Specimen Source  Answer:  Urine, Clean Catch   02/28/23 0658             Vitals/Pain Today's Vitals   02/28/23 0629 02/28/23 0800 02/28/23 1000 02/28/23 1100  BP:  (!) 128/58  135/74  Pulse:  86  90  Resp:  19  13  Temp:   97.7 F (36.5 C)   TempSrc:   Oral   SpO2: 99% 99%  100%  PainSc:        Isolation Precautions No active isolations  Medications Medications   stroke: early stages of recovery book (has no administration in time range)  0.9 %  sodium chloride infusion ( Intravenous New Bag/Given 02/28/23 1312)  acetaminophen (TYLENOL) tablet 650 mg (has no administration in time range)    Or  acetaminophen (TYLENOL) 160 MG/5ML solution 650 mg (has no administration in time range)    Or  acetaminophen (TYLENOL) suppository 650 mg (has no administration in time range)  enoxaparin (LOVENOX) injection 30 mg (has no administration in time range)  aspirin chewable tablet 81 mg (has no administration in time range)  sodium chloride 0.9 % bolus 1,000 mL (0 mLs Intravenous Stopped 02/28/23 0832)  gadobutrol (GADAVIST) 1 MMOL/ML injection 5 mL (5 mLs Intravenous Contrast Given 02/28/23 1027)    Mobility walks with device     Focused Assessments Neuro Assessment Handoff:  Swallow screen pass? Yes  Cardiac Rhythm: Normal sinus rhythm NIH Stroke Scale  Dizziness Present: No Headache Present: No Interval: Initial Level of Consciousness (1a.)   : Alert, keenly responsive LOC Questions (1b. )   : Answers one question correctly LOC Commands (1c. )   : Performs both tasks correctly Best Gaze (2. )  :  Normal Visual (3. )  : Partial hemianopia Facial Palsy (4. )    : Normal symmetrical movements Motor Arm, Left (5a. )   : No drift Motor Arm, Right (5b. ) : No drift Motor Leg, Left (6a. )  : No drift Motor Leg, Right (6b. ) : No drift Limb Ataxia (7. ): Absent Sensory (8. )  : Normal, no sensory loss Best Language (9. )  : No aphasia Dysarthria (10. ): Normal Extinction/Inattention (11.)   : No Abnormality Complete NIHSS TOTAL: 2     Neuro  Assessment: Exceptions to WDL Neuro Checks:   Initial (02/28/23 1230)  Has TPA been given? No If patient is a Neuro Trauma and patient is going to OR before floor call report to [place] nurse: [phone] or [phone]   R Recommendations: See Admitting Provider Note  Report given to:   Additional Notes:

## 2023-02-28 NOTE — H&P (Signed)
History and Physical    [name] [id number] DOB: 30-Mar-1928 DOA: 02/28/2023 DOS: the patient was seen and examined on 02/28/2023 PCP: Corliss Blacker, MD  Patient coming from: SNF  Chief Complaint:  Chief Complaint  Patient presents with   Loss of Consciousness   HPI: [name] is a 89 y.o. female with medical history significant of HTN, CVA, osteoporosis, and dementia who presents after having episode of unresponsiveness.  History is provided by her daughter is present at bedside.  Apparently in January of this year patient had 2 falls and had been sent to [facility].  Currently she is in the skilled nursing part.  At baseline patient ambulates with the use of wheelchair/walker and always with assistance.  Last night she was being assisted to the bathroom when she stumbled.  Staff was present and caught her and she did not hit her head.  It was reported that she clenched her teeth and was not responsive for some period in time.  The patient does not recall any of this.  Her daughter notes that the patient's long-term memory still is intact but her short-term memory is very poor.  Family had come to visit her throughout the week and had not noticed any significant change in her speech or any focal signs of weakness.  Her daughter notes that she wears partials on the top and she has a decaying tooth that is infected that they have been trying to get dental to see her for weeks now.  EMS had noted patient had reportedly had episodes of nausea, vomiting, and diarrhea since 6 AM this morning.  In the emergency department patient was noted to be afebrile with stable vital signs.  Labs significant for WBC 22.4, sodium 131, BUN 20, creatinine 0.87, and albumin 2.8.  Chest x-ray showed no acute abnormality.  CT of the brain and cervical spine noted a new hypodensity in the left occipital lobe inferiorly concerning for acute or subacute infarct.  MRI was thereafter  obtained which noted small acute infarcts in the right perisylvian and left parietal cortex with moderate subacute infarct in left occipital cortex with evidence of chronic ischemic injury with multiple chronic infarcts noted.  Neurology had been formally consulted, but patient was not thought to be a candidate for tPA due to time of onset.   Review of Systems: As mentioned in the history of present illness. All other systems reviewed and are negative, but limited due to patient's history of dementia. Past Medical History:  Diagnosis Date   Benign positional vertigo    Bursitis started 10-2011   left leg and left hip   Clostridium difficile diarrhea    hx of 10 years ago   DJD (degenerative joint disease)    Family history of adverse reaction to anesthesia    daughter has nausea   Hypertension    Leg cramps    Osteoporosis    Pneumonia    as a baby   PONV (postoperative nausea and vomiting)    Stroke ([place])    Varicose veins    Past Surgical History:  Procedure Laterality Date   ABDOMINAL HYSTERECTOMY     APPENDECTOMY     CATARACT EXTRACTION     right   COLONOSCOPY     HERNIA REPAIR     KYPHOSIS SURGERY     ORIF WRIST FRACTURE Left 12/24/2016   Procedure: OPEN REDUCTION INTERNAL FIXATION (ORIF) LEFT WRIST FRACTURE;  Surgeon: Roseanne Kaufman, MD;  Location:  [facility];  Service: Orthopedics;  Laterality: Left;   TUBAL LIGATION     bilateral   Social History:  reports that she has never smoked. She has never used smokeless tobacco. She reports that she does not drink alcohol and does not use drugs.  Allergies  Allergen Reactions   Amoxicillin Diarrhea, Nausea And Vomiting and Other (See Comments)    c-diff from antibiotics 2009   Ciprofloxacin Diarrhea, Nausea And Vomiting and Other (See Comments)    c-diff from antibiotics 2009   Clindamycin/Lincomycin Diarrhea, Nausea And Vomiting and Other (See Comments)    c-diff from antiobiotics 2009   Codeine Nausea And Vomiting    Penicillins Hives, Swelling, Rash and Other (See Comments)    Has patient had a PCN reaction causing immediate rash, facial/tongue/throat swelling, SOB or lightheadedness with hypotension: swelling of arm, and rash Has patient had a PCN reaction causing severe rash involving mucus membranes or skin necrosis: unknown Has patient had a PCN reaction that required hospitalization : unknown Has patient had a PCN reaction occurring within the last 10 years: no If all of the above answers are "NO", then may proceed with Cephalosporin use.    Tetanus Toxoids Hives    Reaction many years ago   Atorvastatin Diarrhea   Ultracet [Tramadol-Acetaminophen] Nausea And Vomiting    Family History  Problem Relation Age of Onset   Alzheimer's disease Mother    Stroke Mother    Coronary artery disease Father    Ulcers Father     Prior to Admission medications   Medication Sig Start Date End Date Taking? Authorizing Provider  calcium carbonate (TUMS - DOSED IN MG ELEMENTAL CALCIUM) 500 MG chewable tablet Chew 1 tablet by mouth daily.   Yes [provider]  Cholecalciferol (VITAMIN D-3) 5000 units TABS Take 5,000 Units by mouth daily.   Yes [provider]  fluticasone (FLONASE) 50 MCG/ACT nasal spray Place 2 sprays into both nostrils daily.   Yes [provider]  lactose free nutrition (BOOST) LIQD Take 237 mLs by mouth 2 (two) times daily between meals.   Yes [provider]  lisinopril (PRINIVIL,ZESTRIL) 10 MG tablet Take 10 mg by mouth daily.   Yes [provider]  Lutein 6 MG CAPS Take 6 mg by mouth daily.     Yes [provider]  melatonin 3 MG TABS tablet Take 3 mg by mouth at bedtime as needed (sleep).   Yes [provider]  Polyethyl Glycol-Propyl Glycol (SYSTANE OP) Place 1 drop into both eyes 3 (three) times daily.   Yes [provider]  PRESCRIPTION MEDICATION "Offer 120ML fluids four times daily for hydration and UTI  prevention"   Yes [provider]  vitamin B-12 (CYANOCOBALAMIN) 1000 MCG tablet Take 1,000 mcg by mouth daily.     Yes [provider]    Physical Exam: Vitals:   02/28/23 0629 02/28/23 0800 02/28/23 1000 02/28/23 1100  BP:  (!) 128/58  135/74  Pulse:  86  90  Resp:  19  13  Temp:   97.7 F (36.5 C)   TempSrc:   Oral   SpO2: 99% 99%  100%    Constitutional: Elderly female Eyes: PERRL, lids and conjunctivae normal ENMT: Mucous membranes are moist.  Erythema of the upper gumline with decaying tooth of the upper left-hand side next to her partial dentures Neck: normal, supple  Respiratory: clear to auscultation bilaterally, no wheezing, no crackles. Normal respiratory effort.   Cardiovascular: Regular  rate and rhythm, no murmurs / rubs / gallops. No extremity edema.   Abdomen: no tenderness, no masses palpated.  Bowel sounds positive.  Musculoskeletal: no clubbing / cyanosis. No joint deformity upper and lower extremities. Good ROM, no contractures. Normal muscle tone.  Skin: no rashes, lesions, ulcers.  Neurologic: CN 2-12 grossly intact.  Strength 5/5 in all 4.  Psychiatric: Poor recent memory.  Alert and oriented to self.  Normal mood.  Data Reviewed:  Reviewed labs, imaging, and pertinent records as noted above in the HPI.  Assessment and Plan:  CVA Acute and subacute.  CT scan of the head noted a new hypodensity in the left occipital lobe inferiorly.  MRI revealed small acute infarcts in the right perisylvian and left parietal cortex with moderate subacute infarct in the left occipital cortex.  Family noted that patient otherwise seems to be at baseline.  Neurology evaluated and noted concern for poorly reproducible right hemianopsia. -Admit to a telemetry bed -Neurochecks -Follow-up echocardiogram -Continue aspirin -PT/OT/speech to eval and treat -Appreciate neurology consultative services.  Syncope and collapse/episode of unresponsiveness Blood  pressures are initially soft 112/48-135/74. -Hold home blood pressure regimen   -Check orthostatic vital signs in a.m.  Leukocytosis Acute.  WBC elevated at 22.4.  Question if secondary to CVA, infected tooth, and/or other cause not known at this time. -Follow-up urinalysis -Check blood culture  Infected tooth Acute.  Patient's daughter notes that she has been having issues with a decaying left upper tooth that has been causing her pain that is next to her partials. -Cefdinir and metronidazole -Appears we have no inpatient dental coverage  Essential hypertension -Held lisinopril due to initial soft blood pressures with possible syncopal episode  Nausea, vomiting, and diarrhea Acute.  Patient reported to have nausea, vomiting, diarrhea starting this morning at 6 AM.  No reported episodes since here in the hospital. -Monitor intake and output -Gentle IV fluids at 75 mL/h -May warrant further workup if symptoms persist  Dementia Patient's daughter notes that she has very poor short-term memory, but her long-term memory is intact. -Delirium precautions  DVT prophylaxis: Lovenox Advance Care Planning:   Code Status: DNR   Consults: Neurology  Family Communication: Daughter updated at bedside  Severity of Illness: The appropriate patient status for this patient is INPATIENT. Inpatient status is judged to be reasonable and necessary in order to provide the required intensity of service to ensure the patient's safety. The patient's presenting symptoms, physical exam findings, and initial radiographic and laboratory data in the context of their chronic comorbidities is felt to place them at high risk for further clinical deterioration. Furthermore, it is not anticipated that the patient will be medically stable for discharge from the hospital within 2 midnights of admission.   * I certify that at the point of admission it is my clinical judgment that the patient will require inpatient  hospital care spanning beyond 2 midnights from the point of admission due to high intensity of service, high risk for further deterioration and high frequency of surveillance required.*  Author: Norval Morton, MD 02/28/2023 11:53 AM  For on call review [email or web address].

## 2023-02-28 NOTE — Consult Note (Signed)
Neurology Consultation  Reason for Consult: stroke on MRI  Referring Physician: Dr. Tamala Julian  CC: syncope  History is obtained from:patient's daughter and medical record   HPI: [name] is a 89 y.o. female with past medical history of memory issues, HTN, prior stroke, benign positional vertigo, osteoporosis who presents to the ED fro evaluation of syncope.  Per daughter she resides at [facility] and she was being assisted to the bathroom and had a witnessed syncopal episode. Ct head revealed a hypodensity in the left occipital lobe. MRI brain revealed Small acute infarcts in the right perisylvian and left parietal cortex and subacute left occipital infarct. Patient denies any weakness, slurred speech, facial droop or vision changes.  Neurology consulted for stroke workup.    LKW: unclear IV thrombolysis given?: no EVT:  No LVO  Premorbid modified Rankin scale (mRS):  4-Needs assistance to walk and tend to bodily needs   ROS: Full ROS was performed and is negative except as noted in the HPI.    Past Medical History:  Diagnosis Date   Benign positional vertigo    Bursitis started 10-2011   left leg and left hip   Clostridium difficile diarrhea    hx of 10 years ago   DJD (degenerative joint disease)    Family history of adverse reaction to anesthesia    daughter has nausea   Hypertension    Leg cramps    Osteoporosis    Pneumonia    as a baby   PONV (postoperative nausea and vomiting)    Stroke ([place])    Varicose veins      Family History  Problem Relation Age of Onset   Alzheimer's disease Mother    Stroke Mother    Coronary artery disease Father    Ulcers Father      Social History:   reports that she has never smoked. She has never used smokeless tobacco. She reports that she does not drink alcohol and does not use drugs.  Medications  Current Facility-Administered Medications:    [START ON 03/01/2023]  stroke: early stages of recovery book, , Does not  apply, Once, Smith, Rondell A, MD   0.9 %  sodium chloride infusion, , Intravenous, Continuous, Smith, Rondell A, MD   acetaminophen (TYLENOL) tablet 650 mg, 650 mg, Oral, Q4H PRN **OR** acetaminophen (TYLENOL) 160 MG/5ML solution 650 mg, 650 mg, Per Tube, Q4H PRN **OR** acetaminophen (TYLENOL) suppository 650 mg, 650 mg, Rectal, Q4H PRN, Smith, Rondell A, MD   enoxaparin (LOVENOX) injection 30 mg, 30 mg, Subcutaneous, Q24H, Smith, Rondell A, MD  Current Outpatient Medications:    calcium carbonate (TUMS - DOSED IN MG ELEMENTAL CALCIUM) 500 MG chewable tablet, Chew 1 tablet by mouth daily., Disp: , Rfl:    Cholecalciferol (VITAMIN D-3) 5000 units TABS, Take 5,000 Units by mouth daily., Disp: , Rfl:    fluticasone (FLONASE) 50 MCG/ACT nasal spray, Place 2 sprays into both nostrils daily., Disp: , Rfl:    lactose free nutrition (BOOST) LIQD, Take 237 mLs by mouth 2 (two) times daily between meals., Disp: , Rfl:    lisinopril (PRINIVIL,ZESTRIL) 10 MG tablet, Take 10 mg by mouth daily., Disp: , Rfl:    Lutein 6 MG CAPS, Take 6 mg by mouth daily.  , Disp: , Rfl:    melatonin 3 MG TABS tablet, Take 3 mg by mouth at bedtime as needed (sleep)., Disp: , Rfl:    Polyethyl Glycol-Propyl Glycol (SYSTANE OP), Place 1 drop into both eyes  3 (three) times daily., Disp: , Rfl:    PRESCRIPTION MEDICATION, "Offer 120ML fluids four times daily for hydration and UTI prevention", Disp: , Rfl:    vitamin B-12 (CYANOCOBALAMIN) 1000 MCG tablet, Take 1,000 mcg by mouth daily.  , Disp: , Rfl:    Exam: Current vital signs: BP 135/74   Pulse 90   Temp 97.7 F (36.5 C) (Oral)   Resp 13   SpO2 100%  Vital signs in last 24 hours: Temp:  [97.5 F (36.4 C)-97.7 F (36.5 C)] 97.7 F (36.5 C) (03/03 1000) Pulse Rate:  [85-90] 90 (03/03 1100) Resp:  [13-19] 13 (03/03 1100) BP: (112-135)/(48-74) 135/74 (03/03 1100) SpO2:  [99 %-100 %] 100 % (03/03 1100)  GENERAL: frail elderly woman Awake, alert in NAD HEENT: -  Normocephalic and atraumatic, dry mm, missing multiple teeth, HOH  LUNGS - Clear to auscultation bilaterally with no wheezes CV - S1S2 RRR, no m/r/g, equal pulses bilaterally. ABDOMEN - Soft, nontender, nondistended with normoactive BS Ext: multiple bruises, warm, well perfused, intact peripheral pulses, no edema  NEURO:  Mental Status: AA&Oto self, place. Stated 89 for age and 74 for year  Language: speech is clear.  Can name and repeat Cranial Nerves: PERRL EOMI, visual fields full, no facial asymmetry, facial sensation intact, hearing intact, tongue/uvula/soft palate midline, normal sternocleidomastoid and trapezius muscle strength. No evidence of tongue atrophy or fibrillations Motor: 4/5 in all 4 extremities. Left hip pain and limited movement  Tone: is normal and bulk is normal Sensation- Intact to light touch bilaterally Coordination: FTN intact bilaterally Gait- deferred  NIHSS 1a Level of Conscious.: 0 1b LOC Questions: 2 1c LOC Commands: 0 2 Best Gaze: 0 3 Visual: 0 4 Facial Palsy: 0 5a Motor Arm - left: 0 5b Motor Arm - Right: 0 6a Motor Leg - Left: 1 6b Motor Leg - Right: 1 7 Limb Ataxia: 0 8 Sensory: 0 9 Best Language: 0 10 Dysarthria: 0 11 Extinct. and Inatten.: 0 TOTAL: 4   Labs I have reviewed labs in epic and the results pertinent to this consultation are:  CBC    Component Value Date/Time   WBC 22.4 (H) 02/28/2023 0645   RBC 4.05 02/28/2023 0645   HGB 11.3 (L) 02/28/2023 0645   HCT 36.0 02/28/2023 0645   PLT 364 02/28/2023 0645   MCV 88.9 02/28/2023 0645   MCH 27.9 02/28/2023 0645   MCHC 31.4 02/28/2023 0645   RDW 14.0 02/28/2023 0645   LYMPHSABS 0.4 (L) 02/28/2023 0645   MONOABS 1.5 (H) 02/28/2023 0645   EOSABS 0.2 02/28/2023 0645   BASOSABS 0.1 02/28/2023 0645    CMP     Component Value Date/Time   NA 131 (L) 02/28/2023 0645   K 4.6 02/28/2023 0645   CL 102 02/28/2023 0645   CO2 24 02/28/2023 0645   GLUCOSE 146 (H) 02/28/2023 0645    BUN 20 02/28/2023 0645   CREATININE 0.87 02/28/2023 0645   CALCIUM 8.8 (L) 02/28/2023 0645   PROT 6.3 (L) 02/28/2023 0645   ALBUMIN 2.8 (L) 02/28/2023 0645   AST 35 02/28/2023 0645   ALT 14 02/28/2023 0645   ALKPHOS 51 02/28/2023 0645   BILITOT 0.9 02/28/2023 0645   GFRNONAA >60 02/28/2023 0645   GFRAA >60 08/03/2017 0251    Lipid Panel     Component Value Date/Time   CHOL 211 (H) 09/18/2016 0308   TRIG 108 09/18/2016 0308   HDL 64 09/18/2016 0308   CHOLHDL 3.3 09/18/2016 0308  VLDL 22 09/18/2016 0308   LDLCALC 125 (H) 09/18/2016 0308      Imaging I have reviewed the images obtained:  CT-headNew hypodensity in the left occipital lobe inferiorly. This could be related to acute or subacute infarct.  MRI examination of the brain  Small acute infarcts in the right perisylvian and left parietal cortex. 2. Moderate subacute infarct in the left occipital cortex. 3. Advanced chronic ischemic injury with multiple chronic infar  Labs WBC 22.4 Na 131  Assessment:  89 y.o. female with past medical history of memory issues, HTN, prior stroke, benign positional vertigo, osteoporosis who presents to the ED fro evaluation of syncope.   Impression: Acute/Subacute ischemic infarcts left occipital lobe, right perisylvian and left parietal   Recommendations: - HgbA1c, fasting lipid panel - Frequent neuro checks - Echocardiogram - CTA head and neck - Prophylactic therapy-Antiplatelet med: Aspirin - dose '81mg'$  - Risk factor modification - Telemetry monitoring - PT consult, OT consult, Speech consult - Stroke team to follow  Beulah Gandy DNP, [place]  [facility]   Attending Neurohospitalist Addendum Patient seen and examined with APP/Resident. Agree with the history and physical as documented above. Agree with the plan as documented, which I helped formulate. I have edited the note above to reflect my full findings and recommendations. I have independently  reviewed the chart, obtained history, review of systems and examined the patient.I have personally reviewed pertinent head/neck/spine imaging (CT/MRI). Please feel free to call with any questions.  On my examination she has poorly reproducible R hemianopsia and is otherwise asymptomatic. Agree with stroke workup above.   -- Su Monks, MD [facility] [phone]  If 7pm- 7am, please page neurology on call as listed in [place].

## 2023-02-28 NOTE — ED Notes (Signed)
Patient transported to CT 

## 2023-02-28 NOTE — Plan of Care (Signed)

## 2023-02-28 NOTE — ED Provider Notes (Signed)
[place] EMERGENCY DEPARTMENT AT [facility] Provider Note   CSN: [id number] Arrival date & time: 02/28/23  4098     History  Chief Complaint  Patient presents with   Loss of Consciousness    [name] is a 89 y.o. female past medical history of hypertension, osteoporosis, AKI presenting today for evaluation of syncope.  Patient had a witnessed syncopal episode today when trying to get up from the bed.  Patient was lower to bed by staff at her skilled nursing facility.  Denies any fall, head injury or loss of consciousness.  Patient states she could not remember what happened.  Per EMS patient started to have nausea, vomiting and diarrhea since 6 AM today. She does not take any blood thinner.    Loss of Consciousness   Past Medical History:  Diagnosis Date   Benign positional vertigo    Bursitis started 10-2011   left leg and left hip   Clostridium difficile diarrhea    hx of 10 years ago   DJD (degenerative joint disease)    Family history of adverse reaction to anesthesia    daughter has nausea   Hypertension    Leg cramps    Osteoporosis    Pneumonia    as a baby   PONV (postoperative nausea and vomiting)    Stroke (HCC)    Varicose veins    Past Surgical History:  Procedure Laterality Date   ABDOMINAL HYSTERECTOMY     APPENDECTOMY     CATARACT EXTRACTION     right   COLONOSCOPY     HERNIA REPAIR     KYPHOSIS SURGERY     ORIF WRIST FRACTURE Left 12/24/2016   Procedure: OPEN REDUCTION INTERNAL FIXATION (ORIF) LEFT WRIST FRACTURE;  Surgeon: Dominica Severin, MD;  Location: [facility] OR;  Service: Orthopedics;  Laterality: Left;   TUBAL LIGATION     bilateral     Home Medications Prior to Admission medications   Medication Sig Start Date End Date Taking? Authorizing Provider  calcium carbonate (TUMS - DOSED IN MG ELEMENTAL CALCIUM) 500 MG chewable tablet Chew 1 tablet by mouth daily.   Yes [provider]  Cholecalciferol (VITAMIN  D-3) 5000 units TABS Take 5,000 Units by mouth daily.   Yes [provider]  fluticasone (FLONASE) 50 MCG/ACT nasal spray Place 2 sprays into both nostrils daily.   Yes [provider]  lactose free nutrition (BOOST) LIQD Take 237 mLs by mouth 2 (two) times daily between meals.   Yes [provider]  lisinopril (PRINIVIL,ZESTRIL) 10 MG tablet Take 10 mg by mouth daily.   Yes [provider]  Lutein 6 MG CAPS Take 6 mg by mouth daily.     Yes [provider]  melatonin 3 MG TABS tablet Take 3 mg by mouth at bedtime as needed (sleep).   Yes [provider]  Polyethyl Glycol-Propyl Glycol (SYSTANE OP) Place 1 drop into both eyes 3 (three) times daily.   Yes [provider]  PRESCRIPTION MEDICATION "Offer fluids four times daily for hydration and UTI prevention"   Yes [provider]  vitamin B-12 (CYANOCOBALAMIN) 1000 MCG tablet Take 1,000 mcg by mouth daily.     Yes [provider]      Allergies    Amoxicillin, Ciprofloxacin, Clindamycin/lincomycin, Codeine, Penicillins, Tetanus toxoids, Atorvastatin, and Ultracet [tramadol-acetaminophen]    Review of Systems   Review of Systems  Cardiovascular:  Positive for syncope.    Physical  Exam Updated Vital Signs BP 135/74   Pulse 90   Temp 97.7 F (36.5 C) (Oral)   Resp 13   SpO2 100%  Physical Exam Vitals and nursing note reviewed.  Constitutional:      Appearance: Normal appearance.  HENT:     Head: Normocephalic and atraumatic.     Mouth/Throat:     Mouth: Mucous membranes are moist.  Eyes:     General: No scleral icterus. Cardiovascular:     Rate and Rhythm: Normal rate and regular rhythm.     Pulses: Normal pulses.     Heart sounds: Normal heart sounds.  Pulmonary:     Effort: Pulmonary effort is normal.     Breath sounds: Normal breath sounds.  Abdominal:     General: Abdomen is flat.     Palpations: Abdomen is soft.     Tenderness:  There is no abdominal tenderness.  Musculoskeletal:        General: No deformity.  Skin:    General: Skin is warm.     Findings: No rash.  Neurological:     General: No focal deficit present.     Mental Status: She is alert.     Comments: A and O x 3. Cranial nerves II through XII intact. Intact sensation to light touch in all 4 extremities. 5/5 strength in all 4 extremities. Intact finger-to-nose and heel-to-shin of all 4 extremities. No visual field cuts. No neglect noted. No aphasia noted.    Psychiatric:        Mood and Affect: Mood normal.     ED Results / Procedures / Treatments   Labs (all labs ordered are listed, but only abnormal results are displayed) Labs Reviewed  COMPREHENSIVE METABOLIC PANEL - Abnormal; Notable for the following components:      Result Value   Sodium 131 (*)    Glucose, Bld 146 (*)    Calcium 8.8 (*)    Total Protein 6.3 (*)    Albumin 2.8 (*)    All other components within normal limits  CBC WITH DIFFERENTIAL/PLATELET - Abnormal; Notable for the following components:   WBC 22.4 (*)    Hemoglobin 11.3 (*)    Neutro Abs 20.0 (*)    Lymphs Abs 0.4 (*)    Monocytes Absolute 1.5 (*)    Abs Immature Granulocytes 0.16 (*)    All other components within normal limits  CBG MONITORING, ED - Abnormal; Notable for the following components:   Glucose-Capillary 141 (*)    All other components within normal limits  RESP PANEL BY RT-PCR (RSV, FLU A&B, COVID)  [id number]  LIPASE, BLOOD  URINALYSIS, ROUTINE W REFLEX MICROSCOPIC  LACTIC ACID, PLASMA    EKG EKG Interpretation  Date/Time:  Sunday February 28 2023 06:29:32 EST Ventricular Rate:  83 PR Interval:  189 QRS Duration: 94 QT Interval:  410 QTC Calculation: 482 R Axis:   50 Text Interpretation: Sinus rhythm Low voltage, precordial leads Artifact T wave abnormality Abnormal ECG Confirmed by Gerhard Munch [phone]) on 02/28/2023 7:42:01 AM  Radiology MR BRAIN W WO CONTRAST  Result Date:  02/28/2023 CLINICAL DATA:  Possible acute infarct on CT. EXAM: MRI HEAD WITHOUT AND WITH CONTRAST TECHNIQUE: Multiplanar, multiecho pulse sequences of the brain and surrounding structures were obtained without and with intravenous contrast. CONTRAST:  5mL GADAVIST GADOBUTROL 1 MMOL/ML IV SOLN COMPARISON:  Head CT from earlier today FINDINGS: Brain: Patchy acute infarcts along the right sylvian fissure, subcentimeter. Additional subcentimeter acute infarct in  the left parietal cortex. Larger and more confluent area of more weakly restricted diffusion along the left occipital cortex with surface enhancement. Chronic small vessel ischemia in the cerebral white matter that is confluent. Small remote bifrontal cortically based infarcts. Chronic lacune in the left centrum semiovale. Mild for age cerebral volume loss. Scattered microhemorrhages mainly in the deep brain and attributed to ischemic insults. Small chronic bilateral cerebellar infarcts. Vascular: Major flow voids are preserved. Skull and upper cervical spine: No focal marrow lesion. Sinuses/Orbits: No acute finding IMPRESSION: 1. Small acute infarcts in the right perisylvian and left parietal cortex. 2. Moderate subacute infarct in the left occipital cortex. 3. Advanced chronic ischemic injury with multiple chronic infarcts. Electronically Signed   By: Tiburcio Pea M.D.   On: 02/28/2023 10:47   CT Head Wo Contrast  Result Date: 02/28/2023 CLINICAL DATA:  Syncope EXAM: CT HEAD WITHOUT CONTRAST CT CERVICAL SPINE WITHOUT CONTRAST TECHNIQUE: Multidetector CT imaging of the head and cervical spine was performed following the standard protocol without intravenous contrast. Multiplanar CT image reconstructions of the cervical spine were also generated. RADIATION DOSE REDUCTION: This exam was performed according to the departmental dose-optimization program which includes automated exposure control, adjustment of the mA and/or kV according to patient size and/or  use of iterative reconstruction technique. COMPARISON:  No comparison studies available. FINDINGS: CT HEAD FINDINGS Brain: Diffuse loss of parenchymal volume is consistent with atrophy. Patchy low attenuation in the deep hemispheric and periventricular white matter is nonspecific, but likely reflects chronic microvascular ischemic demyelination. There is new hypodensity in the left occipital lobe inferiorly. Stable lacunar infarct right basal ganglia. Tiny gas locules in the region of the sella turcica presumably in the cavernous sinuses secondary to IV placement. Vascular: No hyperdense vessel or unexpected calcification. Skull: No evidence for fracture. No worrisome lytic or sclerotic lesion. Sinuses/Orbits: Chronic mucosal thickening noted inferior right maxillary sinus. Visualized portions of the globes and intraorbital fat are unremarkable. Other: None. CT CERVICAL SPINE FINDINGS Alignment: Trace anterolisthesis of C7 on T1. Skull base and vertebrae: No acute fracture. No primary bone lesion or focal pathologic process. Soft tissues and spinal canal: No prevertebral fluid or swelling. No visible canal hematoma. Disc levels: Loss of disc height with endplate degeneration noted C6-7. Upper chest: Biapical pleuroparenchymal scarring. Other: None IMPRESSION: 1. New hypodensity in the left occipital lobe inferiorly. This could be related to acute or subacute infarct. Vasogenic edema considered less likely but not excluded. MRI of the brain without and with contrast recommended to further evaluate. 2. Atrophy with chronic small vessel ischemic disease. 3. No evidence for cervical spine fracture or traumatic subluxation. Electronically Signed   By: Kennith Center M.D.   On: 02/28/2023 08:43   CT Cervical Spine Wo Contrast  Result Date: 02/28/2023 CLINICAL DATA:  Syncope EXAM: CT HEAD WITHOUT CONTRAST CT CERVICAL SPINE WITHOUT CONTRAST TECHNIQUE: Multidetector CT imaging of the head and cervical spine was performed  following the standard protocol without intravenous contrast. Multiplanar CT image reconstructions of the cervical spine were also generated. RADIATION DOSE REDUCTION: This exam was performed according to the departmental dose-optimization program which includes automated exposure control, adjustment of the mA and/or kV according to patient size and/or use of iterative reconstruction technique. COMPARISON:  No comparison studies available. FINDINGS: CT HEAD FINDINGS Brain: Diffuse loss of parenchymal volume is consistent with atrophy. Patchy low attenuation in the deep hemispheric and periventricular white matter is nonspecific, but likely reflects chronic microvascular ischemic demyelination. There is new hypodensity  in the left occipital lobe inferiorly. Stable lacunar infarct right basal ganglia. Tiny gas locules in the region of the sella turcica presumably in the cavernous sinuses secondary to IV placement. Vascular: No hyperdense vessel or unexpected calcification. Skull: No evidence for fracture. No worrisome lytic or sclerotic lesion. Sinuses/Orbits: Chronic mucosal thickening noted inferior right maxillary sinus. Visualized portions of the globes and intraorbital fat are unremarkable. Other: None. CT CERVICAL SPINE FINDINGS Alignment: Trace anterolisthesis of C7 on T1. Skull base and vertebrae: No acute fracture. No primary bone lesion or focal pathologic process. Soft tissues and spinal canal: No prevertebral fluid or swelling. No visible canal hematoma. Disc levels: Loss of disc height with endplate degeneration noted C6-7. Upper chest: Biapical pleuroparenchymal scarring. Other: None IMPRESSION: 1. New hypodensity in the left occipital lobe inferiorly. This could be related to acute or subacute infarct. Vasogenic edema considered less likely but not excluded. MRI of the brain without and with contrast recommended to further evaluate. 2. Atrophy with chronic small vessel ischemic disease. 3. No evidence  for cervical spine fracture or traumatic subluxation. Electronically Signed   By: Kennith Center M.D.   On: 02/28/2023 08:43   DG Chest 2 View  Result Date: 02/28/2023 CLINICAL DATA:  Syncope. EXAM: CHEST - 2 VIEW COMPARISON:  05/14/2020 FINDINGS: Normal heart size and mediastinal contours accounting for leftward rotation. No acute infiltrate or edema. No effusion or pneumothorax. No acute osseous findings. Remote lower thoracic compression fracture with cement augmentation. IMPRESSION: No evidence of acute disease. Electronically Signed   By: Tiburcio Pea M.D.   On: 02/28/2023 08:12    Procedures Procedures    Medications Ordered in ED Medications  sodium chloride 0.9 % bolus 1,000 mL (0 mLs Intravenous Stopped 02/28/23 0832)  gadobutrol (GADAVIST) 1 MMOL/ML injection 5 mL (5 mLs Intravenous Contrast Given 02/28/23 1027)    ED Course/ Medical Decision Making/ A&P                             Medical Decision Making Amount and/or Complexity of Data Reviewed Labs: ordered. Radiology: ordered.  Risk Prescription drug management.   This patient presents to the ED for syncope, this involves an extensive number of treatment options, and is a complaint that carries with a high risk of complications and morbidity.  The differential diagnosis includes anemia, hypoglycemia, arrhythmia, valvular disorder, aortic dissection, PE, subarachnoid hemorrhage, orthostatic, dehydration, seizure, vasovagal.  This is not an exhaustive list.  Lab tests: I ordered and personally interpreted labs.  The pertinent results include: WBC 22.4. Hbg unremarkable. Platelets unremarkable. Electrolytes unremarkable. BUN, creatinine unremarkable. UA pending.  Imaging studies: I ordered imaging studies. I personally reviewed, interpreted imaging and agree with the radiologist's interpretations. The results include: Chest x-ray negative.  CT scan showed hypodensity of occipital lobe inferiorly.  MRI show a small acute  infarct in the right perisylvian and left parietal cortex, moderate subacute infarct in the left occipital cortex.  Problem list/ ED course/ Critical interventions/ Medical management: HPI: See above Vital signs within normal range and stable throughout visit. Laboratory/imaging studies significant for: See above. On physical examination, patient is afebrile and appears in no acute distress.  Neuro exam was unremarkable.  There was no evidence of focal neurodeficit.  Patient is ANO x 3.  MRI shows small acute infarct in the right perisylvian and left parietal cortex, moderate subacute infarct in the left occipital cortex.  CBC with white count of 22.4.  Pneumonia is unlikely with negative chest x-ray.  She tested negative for flu, COVID, RSV.  I do think patient requires admissions for further evaluation and management of syncope and acute stroke.  1 L bolus given.  UA pending. I have reviewed the patient home medicines and have made adjustments as needed.  Cardiac monitoring/EKG: The patient was maintained on a cardiac monitor.  I personally reviewed and interpreted the cardiac monitor which showed an underlying rhythm of: sinus rhythm.  Additional history obtained: External records from outside source obtained and reviewed including: Chart review including previous notes, labs, imaging.  Consultations obtained: I requested consultation with Dr. Selina Cooley, and discussed lab and imaging findings as well as pertinent plan.  She will come down to the patient.  I requested consultation with Dr. Katrinka Blazing hospitalist who agreed to admit the patient.  Disposition Patient is admitted to the hospital. This chart was dictated using voice recognition software.  Despite best efforts to proofread,  errors can occur which can change the documentation meaning.          Final Clinical Impression(s) / ED Diagnoses Final diagnoses:  Cerebrovascular accident (CVA), unspecified mechanism (HCC)  Occipital  cortex infarction [facility])    Rx / DC Orders ED Discharge Orders     None         Jeanelle Malling, [place] 02/28/23 1209    Gerhard Munch, MD 03/02/23 [phone]

## 2023-02-28 NOTE — ED Triage Notes (Addendum)
Patient BIB [facility] from SNF ([facility]). Patient had witnessed syncopal event with SNF staff, no fall, no blood thinners. Patient began having N/V/D x 1 hour.   EMS administered 500 mL NS and 4 mg Zofran.

## 2023-02-28 NOTE — ED Notes (Signed)
Patient transported to MRI 

## 2023-03-01 ENCOUNTER — Inpatient Hospital Stay (HOSPITAL_COMMUNITY): Payer: Medicare Other

## 2023-03-01 DIAGNOSIS — I6340 Cerebral infarction due to embolism of unspecified cerebral artery: Secondary | ICD-10-CM

## 2023-03-01 DIAGNOSIS — R55 Syncope and collapse: Secondary | ICD-10-CM

## 2023-03-01 DIAGNOSIS — D72829 Elevated white blood cell count, unspecified: Secondary | ICD-10-CM | POA: Diagnosis not present

## 2023-03-01 DIAGNOSIS — I6389 Other cerebral infarction: Secondary | ICD-10-CM | POA: Diagnosis not present

## 2023-03-01 DIAGNOSIS — I639 Cerebral infarction, unspecified: Secondary | ICD-10-CM

## 2023-03-01 DIAGNOSIS — I10 Essential (primary) hypertension: Secondary | ICD-10-CM | POA: Diagnosis not present

## 2023-03-01 LAB — CBC
HCT: 28.5 % — ABNORMAL LOW (ref 36.0–46.0)
Hemoglobin: 9.3 g/dL — ABNORMAL LOW (ref 12.0–15.0)
MCH: 28.4 pg (ref 26.0–34.0)
MCHC: 32.6 g/dL (ref 30.0–36.0)
MCV: 86.9 fL (ref 80.0–100.0)
Platelets: 324 10*3/uL (ref 150–400)
RBC: 3.28 MIL/uL — ABNORMAL LOW (ref 3.87–5.11)
RDW: 14.1 % (ref 11.5–15.5)
WBC: 6.5 10*3/uL (ref 4.0–10.5)
nRBC: 0 % (ref 0.0–0.2)

## 2023-03-01 LAB — LIPID PANEL
Cholesterol: 148 mg/dL (ref 0–200)
HDL: 46 mg/dL (ref >40)
LDL Cholesterol: 86 mg/dL (ref 0–99)
Total CHOL/HDL Ratio: 3.2 RATIO
Triglycerides: 80 mg/dL (ref <150)
VLDL: 16 mg/dL (ref 0–40)

## 2023-03-01 LAB — BASIC METABOLIC PANEL
Anion gap: 13 (ref 5–15)
BUN: 12 mg/dL (ref 8–23)
CO2: 17 mmol/L — ABNORMAL LOW (ref 22–32)
Calcium: 8.5 mg/dL — ABNORMAL LOW (ref 8.9–10.3)
Chloride: 103 mmol/L (ref 98–111)
Creatinine, Ser: 0.82 mg/dL (ref 0.44–1.00)
GFR, Estimated: >60 mL/min (ref >60)
Glucose, Bld: 86 mg/dL (ref 70–99)
Potassium: 3.8 mmol/L (ref 3.5–5.1)
Sodium: 133 mmol/L — ABNORMAL LOW (ref 135–145)

## 2023-03-01 LAB — ECHOCARDIOGRAM COMPLETE
AR max vel: 1.85 cm2
AV Area VTI: 2.03 cm2
AV Area mean vel: 1.88 cm2
AV Mean grad: 3 mmHg
AV Peak grad: 5.1 mmHg
Ao pk vel: 1.13 m/s
Area-P 1/2: 3.85 cm2
Calc EF: 66.3 %
P 1/2 time: 415 msec
S' Lateral: 2.5 cm
Single Plane A2C EF: 63 %
Single Plane A4C EF: 67.9 %

## 2023-03-01 MED ORDER — ROSUVASTATIN CALCIUM 5 MG PO TABS
5.0000 mg | ORAL_TABLET | Freq: Every day | ORAL | Status: DC
  Administered 2023-03-01 – 2023-03-02 (×2): 5 mg via ORAL
  Filled 2023-03-01 (×2): qty 1

## 2023-03-01 NOTE — NC FL2 (Signed)
[place] LEVEL OF CARE FORM     IDENTIFICATION  Patient Name: [name] Birthdate: 03-16-1928 Sex: female Admission Date (Current Location): 02/28/2023  [facility] and [place] Number:  Herbalist and Address:  [facility] [place], [place] [address] [place], [place] [address]      Provider Number: [id number]  Attending Physician Name and Address:  Bonnielee Haff, MD  Relative Name and Phone Number:       Current Level of Care: Hospital Recommended Level of Care: [place] Prior Approval Number:    Date Approved/Denied:   PASRR Number:    Discharge Plan: SNF    Current Diagnoses: Patient Active Problem List   Diagnosis Date Noted   CVA (cerebral vascular accident) ([place]) 02/28/2023   Episode of unresponsiveness 02/28/2023   Leukocytosis 02/28/2023   Infected tooth 02/28/2023   Nausea, vomiting, and diarrhea 02/28/2023   Dementia without behavioral disturbance ([place]) 02/28/2023   Acute cystitis 01/10/2023   Generalized weakness 01/08/2023   AKI (acute kidney injury) ([place]) 01/08/2023   Fall at home, initial encounter 01/08/2023   Chronic diastolic CHF (congestive heart failure) ([place]) 01/08/2023   Allergic rhinitis 01/08/2023   Intracranial hemorrhage ([place]) 08/02/2017   Hyperlipidemia 04/22/2017   Radius and ulna distal fracture, left, closed, initial encounter 12/24/2016   Cerebral thrombosis with cerebral infarction 09/17/2016   Acute CVA (cerebrovascular accident) ([place]) 09/17/2016   CVA (cerebral infarction) 09/16/2016   Facial droop-left 09/16/2016   Slurred speech 09/16/2016   Hypertension    Benign positional vertigo    Essential hypertension    Varicose veins of lower extremities with other complications [id number]   Pain in limb 01/06/2012    Orientation RESPIRATION BLADDER Height & Weight     Self, Place, Situation  Normal Continent Weight:   Height:     BEHAVIORAL SYMPTOMS/MOOD  NEUROLOGICAL BOWEL NUTRITION STATUS      Continent Diet (see DC summary)  AMBULATORY STATUS COMMUNICATION OF NEEDS Skin   Limited Assist Verbally Normal                       Personal Care Assistance Level of Assistance  Bathing, Feeding, Dressing Bathing Assistance: Maximum assistance Feeding assistance: Limited assistance Dressing Assistance: Maximum assistance     Functional Limitations Info  Hearing, Sight Sight Info: Impaired Hearing Info: Impaired      SPECIAL CARE FACTORS FREQUENCY  PT (By licensed PT), OT (By licensed OT)     PT Frequency: 5x/wk OT Frequency: 5x/wk            Contractures Contractures Info: Not present    Additional Factors Info  Code Status, Allergies Code Status Info: DNR Allergies Info: Amoxicillin, Ciprofloxacin, Clindamycin/lincomycin, Codeine, Penicillins, Tetanus Toxoids, Atorvastatin, Ultracet (Tramadol-acetaminophen)           Current Medications (03/01/2023):  This is the current hospital active medication list Current Facility-Administered Medications  Medication Dose Route Frequency Provider Last Rate Last Admin   acetaminophen (TYLENOL) tablet 650 mg  650 mg Oral Q4H PRN Fuller Plan A, MD       Or   acetaminophen (TYLENOL) 160 MG/5ML solution 650 mg  650 mg Per Tube Q4H PRN Fuller Plan A, MD       Or   acetaminophen (TYLENOL) suppository 650 mg  650 mg Rectal Q4H PRN Fuller Plan A, MD       aspirin chewable tablet 81 mg  81 mg Oral Daily Rogers Blocker,  Marilu Favre, NP   81 mg at 03/01/23 1041   cefdinir (OMNICEF) capsule 300 mg  300 mg Oral Q12H Smith, Rondell A, MD   300 mg at 03/01/23 1041   enoxaparin (LOVENOX) injection 30 mg  30 mg Subcutaneous Q24H Smith, Rondell A, MD   30 mg at 02/28/23 2128   fluticasone (FLONASE) 50 MCG/ACT nasal spray 2 spray  2 spray Each Nare Daily Smith, Rondell A, MD   2 spray at 03/01/23 1042   melatonin tablet 3 mg  3 mg Oral QHS Smith, Rondell A, MD   3 mg at 02/28/23 2132   metroNIDAZOLE  (FLAGYL) tablet 500 mg  500 mg Oral Q12H Smith, Rondell A, MD   500 mg at 03/01/23 1041   polyvinyl alcohol (LIQUIFILM TEARS) 1.4 % ophthalmic solution 1 drop  1 drop Both Eyes TID Fuller Plan A, MD   1 drop at 03/01/23 1042   rosuvastatin (CRESTOR) tablet 5 mg  5 mg Oral Daily Rosalin Hawking, MD   5 mg at 03/01/23 1044   saccharomyces boulardii (FLORASTOR) capsule 250 mg  250 mg Oral BID Fuller Plan A, MD   250 mg at 03/01/23 1041     Discharge Medications: Please see discharge summary for a list of discharge medications.  Relevant Imaging Results:  Relevant Lab Results:   Additional Information SS#: [id number]  Geralynn Ochs, LCSW

## 2023-03-01 NOTE — Evaluation (Signed)
Occupational Therapy Evaluation Patient Details Name: [name] MRN: [id number] DOB: October 17, 1928 Today's Date: 03/01/2023   History of Present Illness Patient is a 89 yo female admitted to the ED from [facility] after syncopal event on 02/28/23. CT showing hypodensity in the left occipital lobe. MRI showing small acute infarcts in the right perisylvian and left parietal cortex and moderate subacute infarct in the left occipital cortex.   PMH includes: HTN, OA, dementia, and AKI   Clinical Impression   Prior to this admission, patient living at [facility] and had assist for all aspects of care. Currently, patient presenting with L hip pain, and minimal unsteadiness in functional mobility, and need for increased assist for ADL management. Patient min guard for bed mobility, min A for transfers, and mod A for ADLs. OT recommending return to [facility] once medically stable, and would benefit from therapy at [facility] to return to prior level. OT will continue to follow acutely.      Recommendations for follow up therapy are one component of a multi-disciplinary discharge planning process, led by the attending physician.  Recommendations may be updated based on patient status, additional functional criteria and insurance authorization.   Follow Up Recommendations  Skilled nursing-short term rehab (<3 hours/day) (Return to [facility], potential need for OT, but may progress past)     Assistance Recommended at Discharge Frequent or constant Supervision/Assistance  Patient can return home with the following A lot of help with walking and/or transfers;A lot of help with bathing/dressing/bathroom;Direct supervision/assist for medications management;Direct supervision/assist for financial management;Assist for transportation;Help with stairs or ramp for entrance    Functional Status Assessment  Patient has had a recent decline in their functional status and demonstrates the ability to make significant improvements  in function in a reasonable and predictable amount of time.  Equipment Recommendations  None recommended by OT (patient has DME needed)    Recommendations for Other Services       Precautions / Restrictions Precautions Precautions: Fall Restrictions Weight Bearing Restrictions: No      Mobility Bed Mobility Overal bed mobility: Needs Assistance Bed Mobility: Supine to Sit     Supine to sit: Min guard     General bed mobility comments: min gaurd for safety    Transfers Overall transfer level: Needs assistance Equipment used: Rolling walker (2 wheels) Transfers: Sit to/from Stand Sit to Stand: Min assist           General transfer comment: min A from EOB, then min-mod A in order to stand from lower toilet seat      Balance Overall balance assessment: Needs assistance Sitting-balance support: Bilateral upper extremity supported, Feet supported Sitting balance-Leahy Scale: Fair     Standing balance support: Bilateral upper extremity supported, During functional activity, Reliant on assistive device for balance Standing balance-Leahy Scale: Poor Standing balance comment: Reliant on RW and external assist                           ADL either performed or assessed with clinical judgement   ADL Overall ADL's : Needs assistance/impaired Eating/Feeding: Set up;Sitting   Grooming: Set up;Standing;Wash/dry face;Wash/dry hands   Upper Body Bathing: Moderate assistance;Sitting   Lower Body Bathing: Maximal assistance;Sit to/from stand;Sitting/lateral leans   Upper Body Dressing : Moderate assistance;Sitting   Lower Body Dressing: Maximal assistance;Sitting/lateral leans;Sit to/from stand   Toilet Transfer: Moderate assistance;Ambulation;Rolling walker (2 wheels)   Toileting- Clothing Manipulation and Hygiene: Moderate assistance;Sit to/from stand;Sitting/lateral lean  Toileting - Clothing Manipulation Details (indicate cue type and reason): to assist  with underwear and peri-care in standing     Functional mobility during ADLs: Moderate assistance;Rolling walker (2 wheels) General ADL Comments: Patient presenting with L hip pain, minimal unsteadiness in functional mobility, and need for increased assist for ADL management     Vision Baseline Vision/History: 1 Wears glasses Ability to See in Adequate Light: 0 Adequate Patient Visual Report: No change from baseline Vision Assessment?: No apparent visual deficits     Perception     Praxis      Pertinent Vitals/Pain Pain Assessment Pain Assessment: Faces Faces Pain Scale: Hurts even more Pain Location: L hip Pain Descriptors / Indicators: Guarding, Grimacing, Discomfort Pain Intervention(s): Limited activity within patient's tolerance, Monitored during session, Repositioned     Hand Dominance Right   Extremity/Trunk Assessment Upper Extremity Assessment Upper Extremity Assessment: Generalized weakness   Lower Extremity Assessment Lower Extremity Assessment: Defer to PT evaluation   Cervical / Trunk Assessment Cervical / Trunk Assessment: Kyphotic   Communication Communication Communication: HOH   Cognition Arousal/Alertness: Awake/alert Behavior During Therapy: WFL for tasks assessed/performed Overall Cognitive Status: History of cognitive impairments - at baseline                                 General Comments: Patient with dementia and STM deficits at baseline, is oriented to situation and place, but stating it was March of 2023. Daughter present for evaluation and reports patient is cognitively at her baseline.     General Comments  HR at 90 throughout eval, no dizziness, but noting fatigue    Exercises     Shoulder Instructions      Home Living Family/patient expects to be discharged to:: Skilled nursing facility                                 Additional Comments: Patient living at [facility]      Prior  Functioning/Environment Prior Level of Function : Needs assist             Mobility Comments: using a RW ADLs Comments: patient has assist for all aspects of care at [facility]        OT Problem List: Decreased strength;Decreased activity tolerance;Impaired balance (sitting and/or standing);Decreased coordination;Decreased cognition;Decreased safety awareness      OT Treatment/Interventions: Self-care/ADL training;Therapeutic exercise;Energy conservation;DME and/or AE instruction;Manual therapy;Therapeutic activities;Cognitive remediation/compensation;Patient/family education;Balance training;Visual/perceptual remediation/compensation    OT Goals(Current goals can be found in the care plan section) Acute Rehab OT Goals Patient Stated Goal: to go home OT Goal Formulation: With patient/family Time For Goal Achievement: 03/15/23 Potential to Achieve Goals: Good ADL Goals Pt Will Perform Lower Body Bathing: with min assist;sitting/lateral leans;sit to/from stand Pt Will Perform Lower Body Dressing: with min assist;sitting/lateral leans;sit to/from stand Pt Will Transfer to Toilet: with min assist;ambulating;grab bars Pt Will Perform Toileting - Clothing Manipulation and hygiene: with min assist;sitting/lateral leans;sit to/from stand Additional ADL Goal #1: Patient will be able to complete functional task in standing for 3 minutes prior to needing seated rest break to increase overall activity tolerance.  OT Frequency: Min 2X/week    Co-evaluation              AM-PAC OT "6 Clicks" Daily Activity     Outcome Measure Help from another person eating meals?: A Little Help from  another person taking care of personal grooming?: A Little Help from another person toileting, which includes using toliet, bedpan, or urinal?: A Lot Help from another person bathing (including washing, rinsing, drying)?: A Lot Help from another person to put on and taking off regular upper body clothing?: A  Lot Help from another person to put on and taking off regular lower body clothing?: A Lot 6 Click Score: 14   End of Session Equipment Utilized During Treatment: Gait belt;Rolling walker (2 wheels) Nurse Communication: Mobility status  Activity Tolerance: Patient tolerated treatment well Patient left: in chair;with call bell/phone within reach;with chair alarm set  OT Visit Diagnosis: Unsteadiness on feet (R26.81);Muscle weakness (generalized) (M62.81);History of falling (Z91.81);Pain;Other abnormalities of gait and mobility (R26.89) Pain - Right/Left: Left Pain - part of body: Hip                Time: [id number] OT Time Calculation (min): 30 min Charges:  OT General Charges $OT Visit: 1 Visit OT Evaluation $OT Eval Moderate Complexity: 1 Mod  Corinne Ports E. Nandini Bogdanski, OTR/L Acute Rehabilitation Services [place] 03/01/2023, 11:10 AM

## 2023-03-01 NOTE — Progress Notes (Addendum)
STROKE TEAM PROGRESS NOTE   INTERVAL HISTORY No family at the bedside. She is from [facility], PT/OT recommending continued SNF. She is sitting up to eat lunch on my exam and upset that she does not have her teeth. Will likely need long term cardiac monitoring, will discuss with EP for loop vs 30 day heart monitor.  No focal deficits on exam  Vitals:   02/28/23 1958 03/01/23 0000 03/01/23 0400 03/01/23 0713  BP: (!) 165/71 (!) 145/62 135/72 101/66  Pulse: 96   82  Resp: '15 17 15 16  '$ Temp: 98.2 F (36.8 C) 98.1 F (36.7 C) 98.1 F (36.7 C) 98.7 F (37.1 C)  TempSrc: Oral Oral Oral Oral  SpO2: 98% 98% 98% 97%   CBC:  Recent Labs  Lab 02/28/23 0645  WBC 22.4*  NEUTROABS 20.0*  HGB 11.3*  HCT 36.0  MCV 88.9  PLT [id number]   Basic Metabolic Panel:  Recent Labs  Lab 02/28/23 0645  NA 131*  K 4.6  CL 102  CO2 24  GLUCOSE 146*  BUN 20  CREATININE 0.87  CALCIUM 8.8*   Lipid Panel:  Recent Labs  Lab 03/01/23 0428  CHOL 148  TRIG 80  HDL 46  CHOLHDL 3.2  VLDL 16  LDLCALC 86   HgbA1c: No results for input(s): "HGBA1C" in the last 168 hours. Urine Drug Screen: No results for input(s): "LABOPIA", "COCAINSCRNUR", "LABBENZ", "AMPHETMU", "THCU", "LABBARB" in the last 168 hours.  Alcohol Level No results for input(s): "ETH" in the last 168 hours.  IMAGING past 24 hours CT ANGIO HEAD NECK W WO CM  Result Date: 02/28/2023 CLINICAL DATA:  Stroke seen on MRI. EXAM: CT ANGIOGRAPHY HEAD AND NECK TECHNIQUE: Multidetector CT imaging of the head and neck was performed using the standard protocol during bolus administration of intravenous contrast. Multiplanar CT image reconstructions and MIPs were obtained to evaluate the vascular anatomy. Carotid stenosis measurements (when applicable) are obtained utilizing NASCET criteria, using the distal internal carotid diameter as the denominator. RADIATION DOSE REDUCTION: This exam was performed according to the departmental dose-optimization  program which includes automated exposure control, adjustment of the mA and/or kV according to patient size and/or use of iterative reconstruction technique. CONTRAST:  23m OMNIPAQUE IOHEXOL 350 MG/ML SOLN COMPARISON:  Brain MRI 1 day prior.  MRA head 09/16/2016 FINDINGS: CTA NECK FINDINGS Aortic arch: There is mild calcified plaque in the imaged aortic arch. The origins of the major branch vessels are patent. The subclavian arteries are patent to the level imaged. Right carotid system: The right common, internal, and external carotid arteries are patent, with mild plaque at the bifurcation but no hemodynamically significant stenosis or occlusion there is no evidence of dissection or aneurysm. Left carotid system: The left common, internal, and external carotid arteries are patent, with mild plaque of the bifurcation but no hemodynamically significant stenosis or occlusion there is no evidence of dissection or aneurysm. Vertebral arteries: The vertebral arteries are patent, without hemodynamically significant stenosis or occlusion. There is no evidence of dissection or aneurysm. Skeleton: There is no acute osseous abnormality or suspicious osseous lesion. There is no visible canal hematoma. Other neck: The soft tissues of the neck are unremarkable. Upper chest: There is scarring in the lung apices. The lung apices are otherwise clear. Review of the MIP images confirms the above findings CTA HEAD FINDINGS Anterior circulation: There is calcified plaque in the carotid siphons without significant stenosis or occlusion. The bilateral M1 segments are patent. There is  short-segment severe stenosis of the right M3 branch distally in the sylvian fissure (8-111). There is otherwise overall mild atherosclerotic irregularity and narrowing in the bilateral MCA branches. The left A1 segment is diminutive, likely developmental. There is short-segment moderate stenosis of the left A2 segment (7-65) and focal severe stenosis of  the left A3 segment (7-44). There is no aneurysm or AVM. Posterior circulation: The bilateral V4 segments are patent. The basilar artery is patent. The major cerebellar arteries are patent. The PCAs are patent, with mild atherosclerotic irregularity and narrowing of the distal branches but no proximal high-grade stenosis or occlusion. There is a fetal origin of the right PCA. There is no aneurysm or AVM. Venous sinuses: Patent. Anatomic variants: As above. Review of the MIP images confirms the above findings IMPRESSION: 1. Intracranial atherosclerotic disease resulting in short-segment severe stenosis of a right M3 branch, moderate stenosis of the left A2 segment, and focal severe stenosis of the left A3 segment. Mild atherosclerotic irregularity and narrowing elsewhere in the intracranial vasculature. 2. Mild atherosclerotic plaque at the carotid bifurcations without hemodynamically significant stenosis or occlusion. Electronically Signed   By: Valetta Mole M.D.   On: 02/28/2023 14:57   MR BRAIN W WO CONTRAST  Result Date: 02/28/2023 CLINICAL DATA:  Possible acute infarct on CT. EXAM: MRI HEAD WITHOUT AND WITH CONTRAST TECHNIQUE: Multiplanar, multiecho pulse sequences of the brain and surrounding structures were obtained without and with intravenous contrast. CONTRAST:  60m GADAVIST GADOBUTROL 1 MMOL/ML IV SOLN COMPARISON:  Head CT from earlier today FINDINGS: Brain: Patchy acute infarcts along the right sylvian fissure, subcentimeter. Additional subcentimeter acute infarct in the left parietal cortex. Larger and more confluent area of more weakly restricted diffusion along the left occipital cortex with surface enhancement. Chronic small vessel ischemia in the cerebral white matter that is confluent. Small remote bifrontal cortically based infarcts. Chronic lacune in the left centrum semiovale. Mild for age cerebral volume loss. Scattered microhemorrhages mainly in the deep brain and attributed to ischemic  insults. Small chronic bilateral cerebellar infarcts. Vascular: Major flow voids are preserved. Skull and upper cervical spine: No focal marrow lesion. Sinuses/Orbits: No acute finding IMPRESSION: 1. Small acute infarcts in the right perisylvian and left parietal cortex. 2. Moderate subacute infarct in the left occipital cortex. 3. Advanced chronic ischemic injury with multiple chronic infarcts. Electronically Signed   By: JJorje GuildM.D.   On: 02/28/2023 10:47   CT Head Wo Contrast  Result Date: 02/28/2023 CLINICAL DATA:  Syncope EXAM: CT HEAD WITHOUT CONTRAST CT CERVICAL SPINE WITHOUT CONTRAST TECHNIQUE: Multidetector CT imaging of the head and cervical spine was performed following the standard protocol without intravenous contrast. Multiplanar CT image reconstructions of the cervical spine were also generated. RADIATION DOSE REDUCTION: This exam was performed according to the departmental dose-optimization program which includes automated exposure control, adjustment of the mA and/or kV according to patient size and/or use of iterative reconstruction technique. COMPARISON:  No comparison studies available. FINDINGS: CT HEAD FINDINGS Brain: Diffuse loss of parenchymal volume is consistent with atrophy. Patchy low attenuation in the deep hemispheric and periventricular white matter is nonspecific, but likely reflects chronic microvascular ischemic demyelination. There is new hypodensity in the left occipital lobe inferiorly. Stable lacunar infarct right basal ganglia. Tiny gas locules in the region of the sella turcica presumably in the cavernous sinuses secondary to IV placement. Vascular: No hyperdense vessel or unexpected calcification. Skull: No evidence for fracture. No worrisome lytic or sclerotic lesion. Sinuses/Orbits: Chronic mucosal thickening  noted inferior right maxillary sinus. Visualized portions of the globes and intraorbital fat are unremarkable. Other: None. CT CERVICAL SPINE FINDINGS  Alignment: Trace anterolisthesis of C7 on T1. Skull base and vertebrae: No acute fracture. No primary bone lesion or focal pathologic process. Soft tissues and spinal canal: No prevertebral fluid or swelling. No visible canal hematoma. Disc levels: Loss of disc height with endplate degeneration noted C6-7. Upper chest: Biapical pleuroparenchymal scarring. Other: None IMPRESSION: 1. New hypodensity in the left occipital lobe inferiorly. This could be related to acute or subacute infarct. Vasogenic edema considered less likely but not excluded. MRI of the brain without and with contrast recommended to further evaluate. 2. Atrophy with chronic small vessel ischemic disease. 3. No evidence for cervical spine fracture or traumatic subluxation. Electronically Signed   By: Misty Stanley M.D.   On: 02/28/2023 08:43   CT Cervical Spine Wo Contrast  Result Date: 02/28/2023 CLINICAL DATA:  Syncope EXAM: CT HEAD WITHOUT CONTRAST CT CERVICAL SPINE WITHOUT CONTRAST TECHNIQUE: Multidetector CT imaging of the head and cervical spine was performed following the standard protocol without intravenous contrast. Multiplanar CT image reconstructions of the cervical spine were also generated. RADIATION DOSE REDUCTION: This exam was performed according to the departmental dose-optimization program which includes automated exposure control, adjustment of the mA and/or kV according to patient size and/or use of iterative reconstruction technique. COMPARISON:  No comparison studies available. FINDINGS: CT HEAD FINDINGS Brain: Diffuse loss of parenchymal volume is consistent with atrophy. Patchy low attenuation in the deep hemispheric and periventricular white matter is nonspecific, but likely reflects chronic microvascular ischemic demyelination. There is new hypodensity in the left occipital lobe inferiorly. Stable lacunar infarct right basal ganglia. Tiny gas locules in the region of the sella turcica presumably in the cavernous sinuses  secondary to IV placement. Vascular: No hyperdense vessel or unexpected calcification. Skull: No evidence for fracture. No worrisome lytic or sclerotic lesion. Sinuses/Orbits: Chronic mucosal thickening noted inferior right maxillary sinus. Visualized portions of the globes and intraorbital fat are unremarkable. Other: None. CT CERVICAL SPINE FINDINGS Alignment: Trace anterolisthesis of C7 on T1. Skull base and vertebrae: No acute fracture. No primary bone lesion or focal pathologic process. Soft tissues and spinal canal: No prevertebral fluid or swelling. No visible canal hematoma. Disc levels: Loss of disc height with endplate degeneration noted C6-7. Upper chest: Biapical pleuroparenchymal scarring. Other: None IMPRESSION: 1. New hypodensity in the left occipital lobe inferiorly. This could be related to acute or subacute infarct. Vasogenic edema considered less likely but not excluded. MRI of the brain without and with contrast recommended to further evaluate. 2. Atrophy with chronic small vessel ischemic disease. 3. No evidence for cervical spine fracture or traumatic subluxation. Electronically Signed   By: Misty Stanley M.D.   On: 02/28/2023 08:43    PHYSICAL EXAM  Physical Exam  Constitutional: Appears well-developed and well-nourished.   Cardiovascular: Normal rate and regular rhythm.  Respiratory: Effort normal, non-labored breathing  NEURO:  Mental Status: AA&Oto self, place. Correctly stated 53 for age, but 74 for year  Language: speech is clear.  Identifies objects on her tray accurately Cranial Nerves: PERRL EOMI, visual fields full, no facial asymmetry, facial sensation intact, hearing intact, tongue/uvula/soft palate midline, normal sternocleidomastoid and trapezius muscle strength. No evidence of tongue atrophy or fibrillations Motor: 4/5 in all 4 extremities.  Tone: is normal and bulk is normal Sensation- Intact to light touch bilaterally Coordination: FTN intact  bilaterally Gait- deferred    ASSESSMENT/PLAN Ms.  [name] is a 89 y.o. female with history of memory issues, HTN, prior stroke, benign positional vertigo, osteoporosis presenting with syncope. MRI brain revealed small acute infarcts in the right perisylvian and left parietal cortex and subacute left occipital infarct. She has no known history of atrial fibrillation.   Stroke:  Small acute infarcts in the right perisylvian fissure and left parietal cortex as well as a subacute infarct in the left occipital cortex with some hemorrhagic transformation Etiology:  likely embolic, concerning for cardioembolic source Code Stroke CT head Left occipital lobe hypodensity CTA head & neck Intracranial atherosclerotic disease resulting in short-segment severe stenosis of a right M3 branch, moderate stenosis of the left A2 segment, and focal severe stenosis of the left A3 segment. Mild atherosclerotic irregularity and narrowing elsewhere in the intracranial vasculature. Mild atherosclerotic plaque at the carotid bifurcations without hemodynamically significant stenosis or occlusion. MRI  Small acute infarcts in the right perisylvian and left parietal cortex and subacute left occipital infarct  2D Echo EF 65%-70% with LV Normal function  Lower extremity duplex- No DVT in lower extremity  Recommend cardiac event monitoring to rule out A-fib LDL 86 HgbA1c pending VTE prophylaxis - Lovenox No antithrombotic prior to admission, now on aspirin 81 mg daily.  No DAPT for now given hemorrhagic transformation. Therapy recommendations:  Return to SNF Disposition:  plan to return to SNF  Syncope Admitted for syncope Patient got up walk with walker to bathroom, became stumbled and loss of consciousness. EF 65 to 70% Orthostatic vital pending  History of stroke 08/2016 admitted for right CR/as so infarct.  MRI negative.  Carotid Doppler negative.  2D echo EF 65 to 70%.  LDL 125, A1c 5.7.  Discharged  on aspirin and Lipitor 10  Hypertension Home meds:  Lisinopril Stable BP goal less than 160 given hemorrhagic information Long-term BP goal normotensive  Hyperlipidemia LDL 86, goal < 70 Add Crestor '5mg'$   High intensity statin not indicated given advanced age and LDL not far from goal Continue statin at discharge  Other Stroke Risk Factors Advanced Age >/= 11  Hx stroke/TIA  Other Active Problems Decaying tooth Leukocytosis - 22.4 Dental follow up outpatient Abx: omnicef, flagyl Dementia Daughter reports poor short term memory Not on any medications for dementia/memory   Hospital day # 1  Patient seen and examined by NP/APP with MD. MD to update note as needed.   Janine Ores, DNP, FNP-BC [facility] Pager: [phone]  ATTENDING NOTE: I reviewed above note and agree with the assessment and plan. Pt was seen and examined.   Daughter at bedside.  Patient lying in bed, awake alert, orientated to self, age and people.  Told me year was 2023 and months April or March.  Spelled 1 letter wrong on backward spelling world.  Delayed recall 1/3, registration 3/3.  No focal neurologic deficit.  Etiology for patient multifocal infarct concerning for cardioembolic source, such as occult A-fib.  Recommend long-term cardiac monitoring to rule out A-fib.  EP on board to decide on 30-day monitoring versus loop recorder.  Currently on aspirin 81, not DAPT yet given hemorrhagic transformation.  Add on low-dose Crestor.  Etiology for patient's syncope was unclear, pending orthostatic vitals.  Leukocytosis likely due to tooth infection, on antibiotics.  PT 72 recommend SNF.  Will follow.  For detailed assessment and plan, please refer to above/below as I have made changes wherever appropriate.   Rosalin Hawking, MD PhD Stroke Neurology 03/01/2023 6:47 PM   To  contact Stroke Continuity provider, please refer to [email or web address] After hours, contact General Neurology

## 2023-03-01 NOTE — TOC Initial Note (Signed)
Transition of Care [facility]) - Initial/Assessment Note    Patient Details  Name: KAHMARI UDO MRN: [id number] Date of Birth: 1928/08/21  [facility] CM/SW Contact:    Geralynn Ochs, LCSW Phone Number: 03/01/2023, 2:56 PM  Clinical Narrative:                Patient from [facility], recommendation for rehab. CSW updated [facility] and submitted for insurance approval. CSW to follow.   Expected Discharge Plan: Skilled Nursing Facility Barriers to Discharge: Continued Medical Work up, Ship broker   Patient Goals and CMS Choice Patient states their goals for this hospitalization and ongoing recovery are:: return to [employer].gov Compare Post Acute Care list provided to:: Patient Choice offered to / list presented to : Patient [place] ownership interest in [facility].provided to:: Patient    Expected Discharge Plan and Services     Post Acute Care Choice: [place] Living arrangements for the past 2 months: [place]                                      Prior Living Arrangements/Services Living arrangements for the past 2 months: [place] Lives with:: Facility Resident Patient language and need for interpreter reviewed:: No Do you feel safe going back to the place where you live?: Yes      Need for Family Participation in Patient Care: Yes (Comment) Care giver support system in place?: Yes (comment)   Criminal Activity/Legal Involvement Pertinent to Current Situation/Hospitalization: No - Comment as needed  Activities of Daily Living      Permission Sought/Granted Permission sought to share information with : Facility Sport and exercise psychologist, Family Supports Permission granted to share information with : Yes, Verbal Permission Granted  Share Information with NAME: Yolande Jolly  Permission granted to share info w AGENCY: [facility]  Permission granted to share info w  Relationship: Children     Emotional Assessment   Attitude/Demeanor/Rapport: Unable to Assess Affect (typically observed): Unable to Assess Orientation: : Oriented to Self, Oriented to Place, Oriented to Situation Alcohol / Substance Use: Not Applicable Psych Involvement: No (comment)  Admission diagnosis:  CVA (cerebral vascular accident) ([place]) [I63.9] Occipital cortex infarction [facility]) [I63.9] Cerebrovascular accident (CVA), unspecified mechanism ([place]) [I63.9] Patient Active Problem List   Diagnosis Date Noted   CVA (cerebral vascular accident) ([place]) 02/28/2023   Episode of unresponsiveness 02/28/2023   Leukocytosis 02/28/2023   Infected tooth 02/28/2023   Nausea, vomiting, and diarrhea 02/28/2023   Dementia without behavioral disturbance ([place]) 02/28/2023   Acute cystitis 01/10/2023   Generalized weakness 01/08/2023   AKI (acute kidney injury) ([place]) 01/08/2023   Fall at home, initial encounter 01/08/2023   Chronic diastolic CHF (congestive heart failure) ([place]) 01/08/2023   Allergic rhinitis 01/08/2023   Intracranial hemorrhage ([place]) 08/02/2017   Hyperlipidemia 04/22/2017   Radius and ulna distal fracture, left, closed, initial encounter 12/24/2016   Cerebral thrombosis with cerebral infarction 09/17/2016   Acute CVA (cerebrovascular accident) ([place]) 09/17/2016   CVA (cerebral infarction) 09/16/2016   Facial droop-left 09/16/2016   Slurred speech 09/16/2016   Hypertension    Benign positional vertigo    Essential hypertension    Varicose veins of lower extremities with other complications [id number]   Pain in limb 01/06/2012   PCP:  Corliss Blacker, MD Pharmacy:   [place],  [place] [address] Phone: [phone] Fax: [phone]  [place] ([place]), [place] [id number] [place]) [place] [address] Phone: [phone]  Fax: [phone]     Social Determinants of Health (SDOH) Social History: SDOH Screenings   Tobacco Use: Low Risk  (02/28/2023)   SDOH Interventions:     Readmission Risk Interventions     No data to display

## 2023-03-01 NOTE — Plan of Care (Signed)

## 2023-03-01 NOTE — Evaluation (Signed)
Physical Therapy Evaluation Patient Details Name: [name] MRN: [id number] DOB: January 20, 1928 Today's Date: 03/01/2023  History of Present Illness  Patient is a 89 yo female admitted to the ED from [place] after syncopal event on 02/28/23. CT showing hypodensity in the left occipital lobe. MRI showing small acute infarcts in the right perisylvian and left parietal cortex and moderate subacute infarct in the left occipital cortex.   PMH includes: HTN, OA, dementia, and AKI  Clinical Impression  Pt admitted with above diagnosis. Pt received in bed, wants to get up to go to the bathroom. Pt quite HOH as well as having dementia at baseline, perseverating on wanting her bra back on as she wears it all the time and they took it off in ED. Pt mobilized with min A, was unsteady with RW, esp in tight spaces. Will follow acutely and recommend return to SNF when stable. Of note, pt mentions L hip pain that is worse than her usual. No specific point tenderness.  Pt currently with functional limitations due to the deficits listed below (see PT Problem List). Pt will benefit from skilled PT to increase their independence and safety with mobility to allow discharge to the venue listed below.          Recommendations for follow up therapy are one component of a multi-disciplinary discharge planning process, led by the attending physician.  Recommendations may be updated based on patient status, additional functional criteria and insurance authorization.  Follow Up Recommendations Skilled nursing-short term rehab (<3 hours/day) Can patient physically be transported by private vehicle: No    Assistance Recommended at Discharge Frequent or constant Supervision/Assistance  Patient can return home with the following  A little help with walking and/or transfers;A little help with bathing/dressing/bathroom;Assistance with cooking/housework;Assist for transportation;Help with stairs or ramp for entrance;Direct  supervision/assist for medications management;Direct supervision/assist for financial management    Equipment Recommendations None recommended by PT  Recommendations for Other Services       Functional Status Assessment Patient has had a recent decline in their functional status and demonstrates the ability to make significant improvements in function in a reasonable and predictable amount of time.     Precautions / Restrictions Precautions Precautions: Fall Restrictions Weight Bearing Restrictions: No      Mobility  Bed Mobility Overal bed mobility: Needs Assistance Bed Mobility: Supine to Sit     Supine to sit: Min guard     General bed mobility comments: min gaurd for safety    Transfers Overall transfer level: Needs assistance Equipment used: Rolling walker (2 wheels) Transfers: Sit to/from Stand Sit to Stand: Min assist           General transfer comment: min A from EOB, then min-mod A in order to stand from lower toilet seat    Ambulation/Gait Ambulation/Gait assistance: Min assist Gait Distance (Feet): 20 Feet Assistive device: Rolling walker (2 wheels) Gait Pattern/deviations: Step-through pattern, Drifts right/left Gait velocity: decreased Gait velocity interpretation: <1.31 ft/sec, indicative of household ambulator   General Gait Details: unsteady with gait, esp had difficulty in tight spaces and moving RW over threshold of bathroom  Stairs            Wheelchair Mobility    Modified Rankin (Stroke Patients Only) Modified Rankin (Stroke Patients Only) Pre-Morbid Rankin Score: Moderately severe disability Modified Rankin: Moderately severe disability     Balance Overall balance assessment: Needs assistance Sitting-balance support: Bilateral upper extremity supported, Feet supported Sitting balance-Leahy Scale: Fair  Standing balance support: Bilateral upper extremity supported, During functional activity, Reliant on assistive device  for balance Standing balance-Leahy Scale: Poor Standing balance comment: Reliant on RW and external assist                             Pertinent Vitals/Pain Pain Assessment Pain Assessment: Faces Faces Pain Scale: Hurts even more Pain Location: L hip Pain Descriptors / Indicators: Guarding, Grimacing, Discomfort Pain Intervention(s): Limited activity within patient's tolerance, Monitored during session    Home Living Family/patient expects to be discharged to:: Skilled nursing facility     Type of Home: [place]             Additional Comments: Patient living at [facility]    Prior Function Prior Level of Function : Needs assist             Mobility Comments: using a RW as well as having someone with her ADLs Comments: patient has assist for all aspects of care at [place] Hand: Right    Extremity/Trunk Assessment   Upper Extremity Assessment Upper Extremity Assessment: Defer to OT evaluation    Lower Extremity Assessment Lower Extremity Assessment: Generalized weakness;RLE deficits/detail;LLE deficits/detail RLE Deficits / Details: R hip and knee OA, strength grossly 3/5 throughout RLE Sensation: WNL RLE Coordination: decreased gross motor LLE Deficits / Details: grossly 3-/5 throughout. Pt with diffuse tenderness around L hip on palpation LLE Sensation: WNL LLE Coordination: decreased gross motor    Cervical / Trunk Assessment Cervical / Trunk Assessment: Kyphotic  Communication   Communication: HOH  Cognition Arousal/Alertness: Awake/alert Behavior During Therapy: WFL for tasks assessed/performed Overall Cognitive Status: History of cognitive impairments - at baseline                                 General Comments: Patient with dementia and STM deficits at baseline, is oriented to situation and place, but stating it was March of 2023. Daughter present for evaluation and reports  patient is cognitively at her baseline. Pt perseverating on wanting her bra on and they took it off her when she came in        General Comments General comments (skin integrity, edema, etc.): HR 87-90 bpm. Pt relays fatigue with all mobility.    Exercises     Assessment/Plan    PT Assessment Patient needs continued PT services  PT Problem List Decreased strength;Decreased activity tolerance;Decreased balance;Decreased mobility;Decreased coordination;Decreased cognition;Pain       PT Treatment Interventions DME instruction;Gait training;Functional mobility training;Therapeutic activities;Therapeutic exercise;Balance training;Neuromuscular re-education;Patient/family education;Cognitive remediation    PT Goals (Current goals can be found in the Care Plan section)  Acute Rehab PT Goals Patient Stated Goal: retutn to [facility] PT Goal Formulation: With patient/family Time For Goal Achievement: 03/15/23 Potential to Achieve Goals: Good    Frequency Min 3X/week     Co-evaluation PT/OT/SLP Co-Evaluation/Treatment: Yes Reason for Co-Treatment: Complexity of the patient's impairments (multi-system involvement);Necessary to address cognition/behavior during functional activity;To address functional/ADL transfers PT goals addressed during session: Mobility/safety with mobility;Balance;Proper use of DME         AM-PAC PT "6 Clicks" Mobility  Outcome Measure Help needed turning from your back to your side while in a flat bed without using bedrails?: A Little Help needed moving from lying on your back to sitting on the side  of a flat bed without using bedrails?: A Little Help needed moving to and from a bed to a chair (including a wheelchair)?: A Little Help needed standing up from a chair using your arms (e.g., wheelchair or bedside chair)?: A Little Help needed to walk in hospital room?: A Little Help needed climbing 3-5 steps with a railing? : A Lot 6 Click Score: 17    End of  Session Equipment Utilized During Treatment: Gait belt Activity Tolerance: Patient tolerated treatment well Patient left: in chair;with call bell/phone within reach;with family/visitor present;with chair alarm set Nurse Communication: Mobility status PT Visit Diagnosis: Unsteadiness on feet (R26.81);Muscle weakness (generalized) (M62.81);Repeated falls (R29.6)    Time: [id number] PT Time Calculation (min) (ACUTE ONLY): 30 min   Charges:   PT Evaluation $PT Eval Moderate Complexity: [place] chat preferred Office [place] 03/01/2023, 12:50 PM

## 2023-03-01 NOTE — Progress Notes (Signed)
Bilateral lower extremity venous duplex has been completed. Preliminary results can be found in CV Proc through chart review.   03/01/23 12:31 PM [name] RVT

## 2023-03-01 NOTE — Progress Notes (Signed)
  Echocardiogram 2D Echocardiogram has been performed.  [name] 03/01/2023, 8:59 AM

## 2023-03-01 NOTE — Progress Notes (Signed)
Mobility Specialist: Progress Note   03/01/23 1521  Mobility  Activity Ambulated with assistance to bathroom  Level of Assistance Contact guard assist, steadying assist  Assistive Device Front wheel walker  Distance Ambulated (ft) 40 ft (20'x2)  Activity Response Tolerated well  Mobility Referral Yes  $Mobility charge 1 Mobility   Pt received in the bed and agreeable to mobility. Mod I with bed mobility and contact guard during ambulation. C/o Lt hip pain during ambulation, otherwise asymptomatic. Pt back to bed after BR with call bell at her side. Bed alarm is on.   [place] Mobility Specialist Please contact via SecureChat or Rehab office at [phone]

## 2023-03-01 NOTE — Evaluation (Signed)
Speech Language Pathology Evaluation Patient Details Name: [name] MRN: [id number] DOB: 1928/10/28 Today's Date: 03/01/2023 Time: [id number] SLP Time Calculation (min) (ACUTE ONLY): 14 min  Problem List:  Patient Active Problem List   Diagnosis Date Noted   CVA (cerebral vascular accident) ([place]) 02/28/2023   Episode of unresponsiveness 02/28/2023   Leukocytosis 02/28/2023   Infected tooth 02/28/2023   Nausea, vomiting, and diarrhea 02/28/2023   Dementia without behavioral disturbance ([place]) 02/28/2023   Acute cystitis 01/10/2023   Generalized weakness 01/08/2023   AKI (acute kidney injury) ([place]) 01/08/2023   Fall at home, initial encounter 01/08/2023   Chronic diastolic CHF (congestive heart failure) ([place]) 01/08/2023   Allergic rhinitis 01/08/2023   Intracranial hemorrhage ([place]) 08/02/2017   Hyperlipidemia 04/22/2017   Radius and ulna distal fracture, left, closed, initial encounter 12/24/2016   Cerebral thrombosis with cerebral infarction 09/17/2016   Acute CVA (cerebrovascular accident) ([place]) 09/17/2016   CVA (cerebral infarction) 09/16/2016   Facial droop-left 09/16/2016   Slurred speech 09/16/2016   Hypertension    Benign positional vertigo    Essential hypertension    Varicose veins of lower extremities with other complications [id number]   Pain in limb 01/06/2012   Past Medical History:  Past Medical History:  Diagnosis Date   Benign positional vertigo    Bursitis started 10-2011   left leg and left hip   Clostridium difficile diarrhea    hx of 10 years ago   DJD (degenerative joint disease)    Family history of adverse reaction to anesthesia    daughter has nausea   Hypertension    Leg cramps    Osteoporosis    Pneumonia    as a baby   PONV (postoperative nausea and vomiting)    Stroke ([place])    Varicose veins    Past Surgical History:  Past Surgical History:  Procedure Laterality Date   ABDOMINAL HYSTERECTOMY     APPENDECTOMY     CATARACT  EXTRACTION     right   COLONOSCOPY     HERNIA REPAIR     KYPHOSIS SURGERY     ORIF WRIST FRACTURE Left 12/24/2016   Procedure: OPEN REDUCTION INTERNAL FIXATION (ORIF) LEFT WRIST FRACTURE;  Surgeon: Roseanne Kaufman, MD;  Location: [facility];  Service: Orthopedics;  Laterality: Left;   TUBAL LIGATION     bilateral   HPI:  Patient is a 89 yo female admitted to the ED from [place] after syncopal event on 02/28/23. CT showing hypodensity in the left occipital lobe. MRI showing small acute infarcts in the right perisylvian and left parietal cortex and moderate subacute infarct in the left occipital cortex.   PMH includes: HTN, OA, dementia, and AKI   Assessment / Plan / Recommendation Clinical Impression  P participated in cognitive/language evaluation.  Her daughter [name] was at bedside, reporting cognition that appears to be at baseline (memory deficits).  Speech was clear without dysarthria; fluent. No focal CN deficits.  Auditory comprehension and language expression were WNL. Oral reading WNL. Demonstrated mild deficits in awareness, higher level attention, and recall that are consistent with baseline. No SLP f/u is needed given support she will have at D/C. D/W pt and her dtr, who agree. Our service will sign off.    SLP Assessment  SLP Recommendation/Assessment: Patient does not need any further Speech Lanaguage Pathology Services SLP Visit Diagnosis: Cognitive communication deficit (R41.841)    Recommendations for follow up therapy are one component of a multi-disciplinary discharge planning process, led by  the attending physician.  Recommendations may be updated based on patient status, additional functional criteria and insurance authorization.    Follow Up Recommendations  No SLP follow up                       SLP Evaluation Cognition  Overall Cognitive Status: History of cognitive impairments - at baseline Arousal/Alertness: Awake/alert Orientation Level: Oriented to  person;Oriented to place;Oriented to situation Attention: Selective Selective Attention: Impaired Selective Attention Impairment: Verbal basic Memory: Impaired Memory Impairment: Retrieval deficit;Storage deficit Awareness: Impaired       Comprehension  Auditory Comprehension Overall Auditory Comprehension: Appears within functional limits for tasks assessed Yes/No Questions: Within Functional Limits Commands: Within Functional Limits Visual Recognition/Discrimination Discrimination: Within Function Limits Reading Comprehension Reading Status: Within funtional limits    Expression Expression Primary Mode of Expression: Verbal Verbal Expression Overall Verbal Expression: Appears within functional limits for tasks assessed Written Expression Dominant Hand: Right   Oral / Motor  Motor Speech Overall Motor Speech: Appears within functional limits for tasks assessed            [name] 03/01/2023, 12:54 PM [name] L. Tivis Ringer, MA CCC/SLP Clinical Specialist - [place] Office number [phone]

## 2023-03-01 NOTE — Progress Notes (Addendum)
[facility] PROGRESS NOTE   SULY SCHRICKER [id number] DOB: 1928-02-12 DOA: 02/28/2023  PCP: Corliss Blacker, MD  Brief History/Interval Summary: 89 y.o. female with medical history significant of HTN, CVA, osteoporosis, and dementia who presented after having episode of unresponsiveness.  Apparently in January of this year patient had 2 falls and had been sent to [facility].  Currently she is in the skilled nursing part.  At baseline patient ambulates with the use of wheelchair/walker and always with assistance.  Her daughter notes that she wears partials on the top and she has a decaying tooth that is infected that they have been trying to get dental to see her for weeks now. EMS had noted patient had reportedly had episodes of nausea, vomiting, and diarrhea since 6 AM this morning.   Consultants: Neurology  Procedures: Echocardiogram is pending    Subjective/Interval History: Patient denies any complaints this morning.  Does not have any weakness in any 1 side.  Denies any visual impairment.  No headaches.    Assessment/Plan:  CVA MRI showed acute infarcts.  Patient underwent CT angiogram head and neck. LDL is 86.  HbA1c is pending. EKG shows sinus rhythm. Patient is on aspirin and statin.  PT OT evaluation. Echocardiogram is pending. Neurology has been consulted.  Syncope Episode of unresponsiveness was mentioned at the time of admission.  Etiology unclear.  Follow-up on echocardiogram.  Telemetry without any arrhythmias at this time.  Infected tooth/leukocytosis Noted to have elevated WBC.  Apparently has been having tooth ache for several days.  Patient has been started on antibiotics with cefdinir and metronidazole.  Recheck labs tomorrow. She will need outpatient follow-up with dentist.  Essential hypertension/hyponatremia Lisinopril on hold. Monitor sodium levels.  Nausea vomiting and diarrhea Could have been gastroenteritis.  No recurrence  here in the hospital.  Monitor electrolytes.  Dementia Has poor short-term memory.  Stable.  DVT Prophylaxis: Lovenox Code Status: DNR Family Communication: No family at bedside Disposition Plan: Here from skilled nursing facility  Status is: Inpatient Remains inpatient appropriate because: Stroke workup, syncope workup     Medications: Scheduled:   stroke: early stages of recovery book   Does not apply Once   aspirin  81 mg Oral Daily   cefdinir  300 mg Oral Q12H   enoxaparin (LOVENOX) injection  30 mg Subcutaneous Q24H   fluticasone  2 spray Each Nare Daily   melatonin  3 mg Oral QHS   metroNIDAZOLE  500 mg Oral Q12H   polyvinyl alcohol  1 drop Both Eyes TID   rosuvastatin  5 mg Oral Daily   saccharomyces boulardii  250 mg Oral BID   Continuous: [id number] **OR** acetaminophen (TYLENOL) oral liquid 160 mg/5 mL **OR** acetaminophen  Antibiotics: Anti-infectives (From admission, onward)    Start     Dose/Rate Route Frequency Ordered Stop   02/28/23 2200  cefdinir (OMNICEF) capsule 300 mg        300 mg Oral Every 12 hours 02/28/23 1931     02/28/23 2200  metroNIDAZOLE (FLAGYL) tablet 500 mg        500 mg Oral Every 12 hours 02/28/23 1931         Objective:  Vital Signs  Vitals:   02/28/23 1958 03/01/23 0000 03/01/23 0400 03/01/23 0713  BP: (!) 165/71 (!) 145/62 135/72 101/66  Pulse: 96   82  Resp: '15 17 15 16  '$ Temp: 98.2 F (36.8 C) 98.1 F (36.7 C) 98.1 F (36.7 C) 98.7  F (37.1 C)  TempSrc: Oral Oral Oral Oral  SpO2: 98% 98% 98% 97%    Intake/Output Summary (Last 24 hours) at 03/01/2023 0931 Last data filed at 03/01/2023 0840 Gross per 24 hour  Intake 1845.44 ml  Output 1 ml  Net 1844.44 ml   There were no vitals filed for this visit.  General appearance: Awake alert.  In no distress Resp: Clear to auscultation bilaterally.  Normal effort Cardio: S1-S2 is normal regular.  No S3-S4.  No rubs murmurs or bruit GI: Abdomen is soft.  Nontender  nondistended.  Bowel sounds are present normal.  No masses organomegaly Extremities: No edema.  Full range of motion of lower extremities. Neurologic: Alert and oriented x3.  No focal neurological deficits.    Lab Results:  Data Reviewed: I have personally reviewed following labs and reports of the imaging studies  CBC: Recent Labs  Lab 02/28/23 0645  WBC 22.4*  NEUTROABS 20.0*  HGB 11.3*  HCT 36.0  MCV 88.9  PLT [id number]    Basic Metabolic Panel: Recent Labs  Lab 02/28/23 0645  NA 131*  K 4.6  CL 102  CO2 24  GLUCOSE 146*  BUN 20  CREATININE 0.87  CALCIUM 8.8*    GFR: CrCl cannot be calculated (Unknown ideal weight.).  Liver Function Tests: Recent Labs  Lab 02/28/23 0645  AST 35  ALT 14  ALKPHOS 51  BILITOT 0.9  PROT 6.3*  ALBUMIN 2.8*    Recent Labs  Lab 02/28/23 0645  LIPASE 35    CBG: Recent Labs  Lab 02/28/23 0653  GLUCAP 141*    Lipid Profile: Recent Labs    03/01/23 0428  CHOL 148  HDL 46  LDLCALC 86  TRIG 80  CHOLHDL 3.2     Recent Results (from the past 240 hour(s))  Resp panel by RT-PCR (RSV, Flu A&B, Covid) Anterior Nasal Swab     Status: None   Collection Time: 02/28/23  6:58 AM   Specimen: Anterior Nasal Swab  Result Value Ref Range Status   SARS Coronavirus 2 by RT PCR NEGATIVE NEGATIVE Final   Influenza A by PCR NEGATIVE NEGATIVE Final   Influenza B by PCR NEGATIVE NEGATIVE Final    Comment: (NOTE) The Xpert Xpress SARS-CoV-2/FLU/RSV plus assay is intended as an aid in the diagnosis of influenza from Nasopharyngeal swab specimens and should not be used as a sole basis for treatment. Nasal washings and aspirates are unacceptable for Xpert Xpress SARS-CoV-2/FLU/RSV testing.  Fact Sheet for Patients: EntrepreneurPulse.com.au  Fact Sheet for Healthcare Providers: IncredibleEmployment.be  This test is not yet approved or cleared by the [place] FDA and has been authorized for  detection and/or diagnosis of SARS-CoV-2 by FDA under an Emergency Use Authorization (EUA). This EUA will remain in effect (meaning this test can be used) for the duration of the COVID-19 declaration under Section 564(b)(1) of the Act, 21 U.S.C. section 360bbb-3(b)(1), unless the authorization is terminated or revoked.     Resp Syncytial Virus by PCR NEGATIVE NEGATIVE Final    Comment: (NOTE) Fact Sheet for Patients: EntrepreneurPulse.com.au  Fact Sheet for Healthcare Providers: IncredibleEmployment.be  This test is not yet approved or cleared by the [place] FDA and has been authorized for detection and/or diagnosis of SARS-CoV-2 by FDA under an Emergency Use Authorization (EUA). This EUA will remain in effect (meaning this test can be used) for the duration of the COVID-19 declaration under Section 564(b)(1) of the Act, 21 U.S.C. section 360bbb-3(b)(1), unless the  authorization is terminated or revoked.  Performed at [facility], [place] [address], [place], [place] [address]   Culture, blood (single) w Reflex to ID Panel     Status: None (Preliminary result)   Collection Time: 02/28/23  1:22 PM   Specimen: BLOOD  Result Value Ref Range Status   Specimen Description BLOOD BLOOD RIGHT FOREARM  Final   Special Requests   Final    BOTTLES DRAWN AEROBIC AND ANAEROBIC Blood Culture adequate volume   Culture   Final    NO GROWTH < 24 HOURS Performed at [facility], [place] [address], [place], [place] [address]    Report Status PENDING  Incomplete      Radiology Studies: CT ANGIO HEAD NECK W WO CM  Result Date: 02/28/2023 CLINICAL DATA:  Stroke seen on MRI. EXAM: CT ANGIOGRAPHY HEAD AND NECK TECHNIQUE: Multidetector CT imaging of the head and neck was performed using the standard protocol during bolus administration of intravenous contrast. Multiplanar CT image reconstructions and MIPs were obtained to evaluate the vascular  anatomy. Carotid stenosis measurements (when applicable) are obtained utilizing NASCET criteria, using the distal internal carotid diameter as the denominator. RADIATION DOSE REDUCTION: This exam was performed according to the departmental dose-optimization program which includes automated exposure control, adjustment of the mA and/or kV according to patient size and/or use of iterative reconstruction technique. CONTRAST:  41m OMNIPAQUE IOHEXOL 350 MG/ML SOLN COMPARISON:  Brain MRI 1 day prior.  MRA head 09/16/2016 FINDINGS: CTA NECK FINDINGS Aortic arch: There is mild calcified plaque in the imaged aortic arch. The origins of the major branch vessels are patent. The subclavian arteries are patent to the level imaged. Right carotid system: The right common, internal, and external carotid arteries are patent, with mild plaque at the bifurcation but no hemodynamically significant stenosis or occlusion there is no evidence of dissection or aneurysm. Left carotid system: The left common, internal, and external carotid arteries are patent, with mild plaque of the bifurcation but no hemodynamically significant stenosis or occlusion there is no evidence of dissection or aneurysm. Vertebral arteries: The vertebral arteries are patent, without hemodynamically significant stenosis or occlusion. There is no evidence of dissection or aneurysm. Skeleton: There is no acute osseous abnormality or suspicious osseous lesion. There is no visible canal hematoma. Other neck: The soft tissues of the neck are unremarkable. Upper chest: There is scarring in the lung apices. The lung apices are otherwise clear. Review of the MIP images confirms the above findings CTA HEAD FINDINGS Anterior circulation: There is calcified plaque in the carotid siphons without significant stenosis or occlusion. The bilateral M1 segments are patent. There is short-segment severe stenosis of the right M3 branch distally in the sylvian fissure (8-111). There  is otherwise overall mild atherosclerotic irregularity and narrowing in the bilateral MCA branches. The left A1 segment is diminutive, likely developmental. There is short-segment moderate stenosis of the left A2 segment (7-65) and focal severe stenosis of the left A3 segment (7-44). There is no aneurysm or AVM. Posterior circulation: The bilateral V4 segments are patent. The basilar artery is patent. The major cerebellar arteries are patent. The PCAs are patent, with mild atherosclerotic irregularity and narrowing of the distal branches but no proximal high-grade stenosis or occlusion. There is a fetal origin of the right PCA. There is no aneurysm or AVM. Venous sinuses: Patent. Anatomic variants: As above. Review of the MIP images confirms the above findings IMPRESSION: 1. Intracranial atherosclerotic disease resulting in short-segment  severe stenosis of a right M3 branch, moderate stenosis of the left A2 segment, and focal severe stenosis of the left A3 segment. Mild atherosclerotic irregularity and narrowing elsewhere in the intracranial vasculature. 2. Mild atherosclerotic plaque at the carotid bifurcations without hemodynamically significant stenosis or occlusion. Electronically Signed   By: Valetta Mole M.D.   On: 02/28/2023 14:57   MR BRAIN W WO CONTRAST  Result Date: 02/28/2023 CLINICAL DATA:  Possible acute infarct on CT. EXAM: MRI HEAD WITHOUT AND WITH CONTRAST TECHNIQUE: Multiplanar, multiecho pulse sequences of the brain and surrounding structures were obtained without and with intravenous contrast. CONTRAST:  51m GADAVIST GADOBUTROL 1 MMOL/ML IV SOLN COMPARISON:  Head CT from earlier today FINDINGS: Brain: Patchy acute infarcts along the right sylvian fissure, subcentimeter. Additional subcentimeter acute infarct in the left parietal cortex. Larger and more confluent area of more weakly restricted diffusion along the left occipital cortex with surface enhancement. Chronic small vessel ischemia in  the cerebral white matter that is confluent. Small remote bifrontal cortically based infarcts. Chronic lacune in the left centrum semiovale. Mild for age cerebral volume loss. Scattered microhemorrhages mainly in the deep brain and attributed to ischemic insults. Small chronic bilateral cerebellar infarcts. Vascular: Major flow voids are preserved. Skull and upper cervical spine: No focal marrow lesion. Sinuses/Orbits: No acute finding IMPRESSION: 1. Small acute infarcts in the right perisylvian and left parietal cortex. 2. Moderate subacute infarct in the left occipital cortex. 3. Advanced chronic ischemic injury with multiple chronic infarcts. Electronically Signed   By: JJorje GuildM.D.   On: 02/28/2023 10:47   CT Head Wo Contrast  Result Date: 02/28/2023 CLINICAL DATA:  Syncope EXAM: CT HEAD WITHOUT CONTRAST CT CERVICAL SPINE WITHOUT CONTRAST TECHNIQUE: Multidetector CT imaging of the head and cervical spine was performed following the standard protocol without intravenous contrast. Multiplanar CT image reconstructions of the cervical spine were also generated. RADIATION DOSE REDUCTION: This exam was performed according to the departmental dose-optimization program which includes automated exposure control, adjustment of the mA and/or kV according to patient size and/or use of iterative reconstruction technique. COMPARISON:  No comparison studies available. FINDINGS: CT HEAD FINDINGS Brain: Diffuse loss of parenchymal volume is consistent with atrophy. Patchy low attenuation in the deep hemispheric and periventricular white matter is nonspecific, but likely reflects chronic microvascular ischemic demyelination. There is new hypodensity in the left occipital lobe inferiorly. Stable lacunar infarct right basal ganglia. Tiny gas locules in the region of the sella turcica presumably in the cavernous sinuses secondary to IV placement. Vascular: No hyperdense vessel or unexpected calcification. Skull: No  evidence for fracture. No worrisome lytic or sclerotic lesion. Sinuses/Orbits: Chronic mucosal thickening noted inferior right maxillary sinus. Visualized portions of the globes and intraorbital fat are unremarkable. Other: None. CT CERVICAL SPINE FINDINGS Alignment: Trace anterolisthesis of C7 on T1. Skull base and vertebrae: No acute fracture. No primary bone lesion or focal pathologic process. Soft tissues and spinal canal: No prevertebral fluid or swelling. No visible canal hematoma. Disc levels: Loss of disc height with endplate degeneration noted C6-7. Upper chest: Biapical pleuroparenchymal scarring. Other: None IMPRESSION: 1. New hypodensity in the left occipital lobe inferiorly. This could be related to acute or subacute infarct. Vasogenic edema considered less likely but not excluded. MRI of the brain without and with contrast recommended to further evaluate. 2. Atrophy with chronic small vessel ischemic disease. 3. No evidence for cervical spine fracture or traumatic subluxation. Electronically Signed   By: EVerda CuminsD.  On: 02/28/2023 08:43   CT Cervical Spine Wo Contrast  Result Date: 02/28/2023 CLINICAL DATA:  Syncope EXAM: CT HEAD WITHOUT CONTRAST CT CERVICAL SPINE WITHOUT CONTRAST TECHNIQUE: Multidetector CT imaging of the head and cervical spine was performed following the standard protocol without intravenous contrast. Multiplanar CT image reconstructions of the cervical spine were also generated. RADIATION DOSE REDUCTION: This exam was performed according to the departmental dose-optimization program which includes automated exposure control, adjustment of the mA and/or kV according to patient size and/or use of iterative reconstruction technique. COMPARISON:  No comparison studies available. FINDINGS: CT HEAD FINDINGS Brain: Diffuse loss of parenchymal volume is consistent with atrophy. Patchy low attenuation in the deep hemispheric and periventricular white matter is nonspecific, but  likely reflects chronic microvascular ischemic demyelination. There is new hypodensity in the left occipital lobe inferiorly. Stable lacunar infarct right basal ganglia. Tiny gas locules in the region of the sella turcica presumably in the cavernous sinuses secondary to IV placement. Vascular: No hyperdense vessel or unexpected calcification. Skull: No evidence for fracture. No worrisome lytic or sclerotic lesion. Sinuses/Orbits: Chronic mucosal thickening noted inferior right maxillary sinus. Visualized portions of the globes and intraorbital fat are unremarkable. Other: None. CT CERVICAL SPINE FINDINGS Alignment: Trace anterolisthesis of C7 on T1. Skull base and vertebrae: No acute fracture. No primary bone lesion or focal pathologic process. Soft tissues and spinal canal: No prevertebral fluid or swelling. No visible canal hematoma. Disc levels: Loss of disc height with endplate degeneration noted C6-7. Upper chest: Biapical pleuroparenchymal scarring. Other: None IMPRESSION: 1. New hypodensity in the left occipital lobe inferiorly. This could be related to acute or subacute infarct. Vasogenic edema considered less likely but not excluded. MRI of the brain without and with contrast recommended to further evaluate. 2. Atrophy with chronic small vessel ischemic disease. 3. No evidence for cervical spine fracture or traumatic subluxation. Electronically Signed   By: Misty Stanley M.D.   On: 02/28/2023 08:43   DG Chest 2 View  Result Date: 02/28/2023 CLINICAL DATA:  Syncope. EXAM: CHEST - 2 VIEW COMPARISON:  05/14/2020 FINDINGS: Normal heart size and mediastinal contours accounting for leftward rotation. No acute infiltrate or edema. No effusion or pneumothorax. No acute osseous findings. Remote lower thoracic compression fracture with cement augmentation. IMPRESSION: No evidence of acute disease. Electronically Signed   By: Jorje Guild M.D.   On: 02/28/2023 08:12       LOS: 1 day   [facility] Pager on [email or web address]  03/01/2023, 9:31 AM

## 2023-03-02 ENCOUNTER — Encounter (HOSPITAL_COMMUNITY)
Admission: EM | Disposition: A | Discharge status: Skilled Nursing Facility | Payer: Self-pay | Source: Skilled Nursing Facility | Attending: Internal Medicine

## 2023-03-02 DIAGNOSIS — M13861 Other specified arthritis, right knee: Secondary | ICD-10-CM | POA: Diagnosis not present

## 2023-03-02 DIAGNOSIS — Z8673 Personal history of transient ischemic attack (TIA), and cerebral infarction without residual deficits: Secondary | ICD-10-CM | POA: Diagnosis not present

## 2023-03-02 DIAGNOSIS — R278 Other lack of coordination: Secondary | ICD-10-CM | POA: Diagnosis not present

## 2023-03-02 DIAGNOSIS — I639 Cerebral infarction, unspecified: Secondary | ICD-10-CM | POA: Diagnosis not present

## 2023-03-02 DIAGNOSIS — I6340 Cerebral infarction due to embolism of unspecified cerebral artery: Secondary | ICD-10-CM | POA: Diagnosis not present

## 2023-03-02 DIAGNOSIS — I63433 Cerebral infarction due to embolism of bilateral posterior cerebral arteries: Secondary | ICD-10-CM | POA: Diagnosis not present

## 2023-03-02 DIAGNOSIS — H04123 Dry eye syndrome of bilateral lacrimal glands: Secondary | ICD-10-CM | POA: Diagnosis not present

## 2023-03-02 DIAGNOSIS — R531 Weakness: Secondary | ICD-10-CM | POA: Diagnosis not present

## 2023-03-02 DIAGNOSIS — R4182 Altered mental status, unspecified: Secondary | ICD-10-CM | POA: Diagnosis not present

## 2023-03-02 DIAGNOSIS — M6281 Muscle weakness (generalized): Secondary | ICD-10-CM | POA: Diagnosis not present

## 2023-03-02 DIAGNOSIS — J309 Allergic rhinitis, unspecified: Secondary | ICD-10-CM | POA: Diagnosis not present

## 2023-03-02 DIAGNOSIS — Z7401 Bed confinement status: Secondary | ICD-10-CM | POA: Diagnosis not present

## 2023-03-02 DIAGNOSIS — M810 Age-related osteoporosis without current pathological fracture: Secondary | ICD-10-CM | POA: Diagnosis not present

## 2023-03-02 DIAGNOSIS — I130 Hypertensive heart and chronic kidney disease with heart failure and stage 1 through stage 4 chronic kidney disease, or unspecified chronic kidney disease: Secondary | ICD-10-CM | POA: Diagnosis not present

## 2023-03-02 DIAGNOSIS — F0390 Unspecified dementia without behavioral disturbance: Secondary | ICD-10-CM | POA: Diagnosis not present

## 2023-03-02 DIAGNOSIS — N183 Chronic kidney disease, stage 3 unspecified: Secondary | ICD-10-CM | POA: Diagnosis not present

## 2023-03-02 DIAGNOSIS — I10 Essential (primary) hypertension: Secondary | ICD-10-CM | POA: Diagnosis not present

## 2023-03-02 DIAGNOSIS — K047 Periapical abscess without sinus: Secondary | ICD-10-CM | POA: Diagnosis not present

## 2023-03-02 DIAGNOSIS — R2681 Unsteadiness on feet: Secondary | ICD-10-CM | POA: Diagnosis not present

## 2023-03-02 DIAGNOSIS — M13852 Other specified arthritis, left hip: Secondary | ICD-10-CM | POA: Diagnosis not present

## 2023-03-02 DIAGNOSIS — E785 Hyperlipidemia, unspecified: Secondary | ICD-10-CM | POA: Diagnosis not present

## 2023-03-02 DIAGNOSIS — R55 Syncope and collapse: Secondary | ICD-10-CM | POA: Diagnosis not present

## 2023-03-02 HISTORY — PX: LOOP RECORDER INSERTION: EP1214

## 2023-03-02 LAB — BASIC METABOLIC PANEL
Anion gap: 8 (ref 5–15)
BUN: 11 mg/dL (ref 8–23)
CO2: 23 mmol/L (ref 22–32)
Calcium: 8.8 mg/dL — ABNORMAL LOW (ref 8.9–10.3)
Chloride: 103 mmol/L (ref 98–111)
Creatinine, Ser: 0.84 mg/dL (ref 0.44–1.00)
GFR, Estimated: >60 mL/min (ref >60)
Glucose, Bld: 99 mg/dL (ref 70–99)
Potassium: 3.7 mmol/L (ref 3.5–5.1)
Sodium: 134 mmol/L — ABNORMAL LOW (ref 135–145)

## 2023-03-02 LAB — CBC
HCT: 32.4 % — ABNORMAL LOW (ref 36.0–46.0)
Hemoglobin: 10.3 g/dL — ABNORMAL LOW (ref 12.0–15.0)
MCH: 27.8 pg (ref 26.0–34.0)
MCHC: 31.8 g/dL (ref 30.0–36.0)
MCV: 87.3 fL (ref 80.0–100.0)
Platelets: 379 10*3/uL (ref 150–400)
RBC: 3.71 MIL/uL — ABNORMAL LOW (ref 3.87–5.11)
RDW: 14 % (ref 11.5–15.5)
WBC: 4.6 10*3/uL (ref 4.0–10.5)
nRBC: 0 % (ref 0.0–0.2)

## 2023-03-02 LAB — HEMOGLOBIN A1C
Hgb A1c MFr Bld: 6 % — ABNORMAL HIGH (ref 4.8–5.6)
Mean Plasma Glucose: 126 mg/dL

## 2023-03-02 LAB — MAGNESIUM: Magnesium: 1.8 mg/dL (ref 1.7–2.4)

## 2023-03-02 SURGERY — LOOP RECORDER INSERTION

## 2023-03-02 MED ORDER — LIDOCAINE-EPINEPHRINE 1 %-1:100000 IJ SOLN
INTRAMUSCULAR | Status: AC
  Filled 2023-03-02: qty 1

## 2023-03-02 MED ORDER — CEFDINIR 300 MG PO CAPS: 300.0000 mg | ORAL_CAPSULE | Freq: Two times a day (BID) | ORAL | 0 refills | Status: AC

## 2023-03-02 MED ORDER — SACCHAROMYCES BOULARDII 250 MG PO CAPS: 250.0000 mg | ORAL_CAPSULE | Freq: Two times a day (BID) | ORAL | Status: AC

## 2023-03-02 MED ORDER — ASPIRIN 81 MG PO TBEC: 81.0000 mg | DELAYED_RELEASE_TABLET | Freq: Every day | ORAL | 2 refills | Status: AC

## 2023-03-02 MED ORDER — ROSUVASTATIN CALCIUM 5 MG PO TABS: 5.0000 mg | ORAL_TABLET | Freq: Every day | ORAL | Status: AC

## 2023-03-02 MED ORDER — METRONIDAZOLE 500 MG PO TABS: 500.0000 mg | ORAL_TABLET | Freq: Two times a day (BID) | ORAL | 0 refills | Status: AC

## 2023-03-02 MED ORDER — LIDOCAINE-EPINEPHRINE 1 %-1:100000 IJ SOLN
INTRAMUSCULAR | Status: DC | PRN
  Administered 2023-03-02: 30 mL

## 2023-03-02 SURGICAL SUPPLY — 2 items
PACK LOOP INSERTION (CUSTOM PROCEDURE TRAY) ×1 IMPLANT
SYSTEM MONITOR REVEAL LINQ II (Prosthesis & Implant Heart) IMPLANT

## 2023-03-02 NOTE — Discharge Instructions (Addendum)
Care After Your Loop Recorder  You have a [employer] Loop Recorder   Monitor your cardiac device site for redness, swelling, and drainage. Call the device clinic at [phone] if you experience these symptoms or fever/chills.  If you notice bleeding from your site, hold firm, but gently pressure with two fingers for 5 minutes. Dried blood on the steri-strips when removing the outer bandage is normal.   Keep the large square bandage on your site for 24 hours and then you may remove it yourself. Keep the steri-strips underneath in place.   You may shower after 72 hours / 3 days from your procedure with the steri-strips in place. They will usually fall off on their own, or may be removed after 10 days. Pat dry.   Avoid lotions, ointments, or perfumes over your incision until it is well-healed.  Please do not submerge in water until your site is completely healed.   Your device is MRI compatible.   Remote monitoring is used to monitor your cardiac device from home. This monitoring is scheduled every month by our office. It allows us to keep an eye on the function of your device to ensure it is working properly.    

## 2023-03-02 NOTE — Consult Note (Addendum)
ELECTROPHYSIOLOGY CONSULT NOTE  Patient ID: [name] MRN: [id number], DOB/AGE: [birth year]   Admit date: 02/28/2023 Date of Consult: 03/02/2023  Primary Physician: Corliss Blacker, MD Primary Cardiologist: None  Primary Electrophysiologist: New to None  Reason for Consultation: Cryptogenic stroke; recommendations regarding Implantable Loop Recorder Insurance: BCBS Medicare  History of Present Illness EP has been asked to evaluate [name] for placement of an implantable loop recorder to monitor for atrial fibrillation by Dr Erlinda Hong.  The patient was admitted on 02/28/2023 with syncope - she got up with walker to use bathroom, stumbled and LOC.  Imaging demonstrated Stroke:  Small acute infarcts in the right perisylvian fissure and left parietal cortex as well as a subacute infarct in the left occipital cortex with some hemorrhagic transformation Etiology:  likely embolic, concerning for cardioembolic source.    She has undergone workup for stroke including:   Code Stroke CT head Left occipital lobe hypodensity CTA head & neck Intracranial atherosclerotic disease resulting in short-segment severe stenosis of a right M3 branch, moderate stenosis of the left A2 segment, and focal severe stenosis of the left A3 segment. Mild atherosclerotic irregularity and narrowing elsewhere in the intracranial vasculature. Mild atherosclerotic plaque at the carotid bifurcations without hemodynamically significant stenosis or occlusion. MRI  Small acute infarcts in the right perisylvian and left parietal cortex and subacute left occipital infarct  2D Echo EF 65%-70% with LV Normal function  Lower extremity duplex- No DVT in lower extremity  LDL 86 HgbA1c pending VTE prophylaxis - Lovenox No antithrombotic prior to admission, now on aspirin 81 mg daily.  No DAPT for now given hemorrhagic transformation. Therapy recommendations:  Return to SNF Disposition:  plan to return to  SNF  The patient has been monitored on telemetry which has demonstrated sinus rhythm with no arrhythmias.  Inpatient stroke work-up will not require a TEE per Neurology.   Echocardiogram as above. Lab work is reviewed.  Prior to admission, the patient denies chest pain, shortness of breath, dizziness, palpitations, or syncope.  She is recovering from her stroke with plans to rehab at SNF  at discharge.  Allergies, Past Medical, Surgical, Social, and Family Histories have been reviewed and are referenced here-in when relevant for medical decision making.   Inpatient Medications:   aspirin  81 mg Oral Daily   cefdinir  300 mg Oral Q12H   enoxaparin (LOVENOX) injection  30 mg Subcutaneous Q24H   fluticasone  2 spray Each Nare Daily   melatonin  3 mg Oral QHS   metroNIDAZOLE  500 mg Oral Q12H   polyvinyl alcohol  1 drop Both Eyes TID   rosuvastatin  5 mg Oral Daily   saccharomyces boulardii  250 mg Oral BID    Physical Exam: Vitals:   03/02/23 0346 03/02/23 0718 03/02/23 0946 03/02/23 1107  BP: 128/66 (!) 120/59 (!) 141/61 (!) 136/53  Pulse: 69 65  67  Resp: '16 16  15  '$ Temp: 97.9 F (36.6 C) 98 F (36.7 C)  98.5 F (36.9 C)  TempSrc: Oral Oral  Oral  SpO2: 98% 98%  96%  Weight:      Height:        GEN- NAD. A&O x 3. Normal affect. HEENT: Normocephalic, atraumatic Lungs- CTAB, Normal effort.  Heart- Regular rate and rhythm rate and rhythm. No M/G/R.  Extremities- No peripheral edema. no clubbing or cyanosis Skin- warm and dry, no rash or lesion. Neuro - alert and oriented, answers questions appropriately  12-lead ECG  (personally reviewed) All prior EKG's in EPIC reviewed with no documented atrial fibrillation  Telemetry SR, no AFib (personally reviewed)  Assessment and Plan:  1. Cryptogenic stroke The patient presents with cryptogenic stroke.  The patient does not have a TEE planned for this AM.  I spoke at length with the patient about monitoring for afib with an  implantable loop recorder.  Risks, benefits, and alteratives to implantable loop recorder were discussed with the patient today.   At this time, the patient is very clear in their decision to proceed with implantable loop recorder.    Wound care was reviewed with the patient (keep incision clean and dry for 3 days). Please call with questions.    Mamie Levers, NP 03/02/2023 1:03 PM

## 2023-03-02 NOTE — Progress Notes (Signed)
Patient being d/c to [facility], son at bedside. Assessment completed see flowsheets. [facility] has arrived to take pt to [facility] discharge packet with AVS given. Vitals stable , pt nor family has any concerns at this time. Belongings packed and sent with patient  03/02/23 1926  Vitals  Temp 99.2 F (37.3 C)  Temp Source Oral  BP (!) 143/62  MAP (mmHg) 85  BP Location Right Arm  BP Method Automatic  Patient Position (if appropriate) Lying  Pulse Rate 76  Pulse Rate Source Dinamap  Resp 16  Level of Consciousness  Level of Consciousness Alert  Oxygen Therapy  SpO2 98 %  O2 Device Room Air

## 2023-03-02 NOTE — TOC Transition Note (Signed)
Transition of Care [facility]) - CM/SW Discharge Note   Patient Details  Name: [name] MRN: [id number] Date of Birth: 06-10-1928  Transition of Care [facility]) CM/SW Contact:  Geralynn Ochs, LCSW Phone Number: 03/02/2023, 2:59 PM   Clinical Narrative:   CSW received insurance approval for SNF, confirmed with MD that patient is medically stable. Patient scheduled for loop placement this afternoon. CSW spoke with patient and son at bedside, they are in agreement to transition back to [facility] after procedure. Transport arranged with [facility] for after 6 pm.  Nurse to call report to [phone].    Final next level of care: Skilled Nursing Facility Barriers to Discharge: Barriers Resolved   Patient Goals and CMS Choice CMS Medicare.gov Compare Post Acute Care list provided to:: Patient Choice offered to / list presented to : Patient  Discharge Placement                Patient chooses bed at: [facility] [facility] Patient to be transferred to facility by: [place] Name of family member notified: Self, son at bedside Patient and family notified of of transfer: 03/02/23  Discharge Plan and Services Additional resources added to the After Visit Summary for       Post Acute Care Choice: [place]                               Social Determinants of Health (SDOH) Interventions SDOH Screenings   Food Insecurity: No Food Insecurity (03/01/2023)  Housing: [place]  (03/01/2023)  Transportation Needs: No Transportation Needs (03/01/2023)  Utilities: Not At Risk (03/01/2023)  Tobacco Use: Low Risk  (02/28/2023)     Readmission Risk Interventions     No data to display

## 2023-03-02 NOTE — Progress Notes (Signed)
Report given to Legrand Como RN at [facility]

## 2023-03-02 NOTE — Progress Notes (Signed)
[facility] PROGRESS NOTE   [name] [id number] DOB: 12/06/1928 DOA: 02/28/2023  PCP: Corliss Blacker, MD  Brief History/Interval Summary: 89 y.o. female with medical history significant of HTN, CVA, osteoporosis, and dementia who presented after having episode of unresponsiveness.  Apparently in January of this year patient had 2 falls and had been sent to [facility].  Currently she is in the skilled nursing part.  At baseline patient ambulates with the use of wheelchair/walker and always with assistance.  Her daughter notes that she wears partials on the top and she has a decaying tooth that is infected that they have been trying to get dental to see her for weeks now. EMS had noted patient had reportedly had episodes of nausea, vomiting, and diarrhea since 6 AM this morning.   Consultants: Neurology  Procedures: Echocardiogram     Subjective/Interval History: Patient complains of feeling cold.  Denies any weakness.  No chest pain or shortness of breath.    Assessment/Plan:  CVA MRI showed acute infarcts.  Patient underwent CT angiogram head and neck. LDL is 86.  HbA1c is 6.0. EKG shows sinus rhythm. Patient is on aspirin and statin.  No dual antiplatelet therapy due to hemorrhagic transformation. PT OT evaluation. Echocardiogram shows normal systolic function of the left ventricle.  No significant valvular abnormalities noted.. Neurology is following.  They have contacted cardiology to consider loop recorder placement.  Syncope Episode of unresponsiveness was mentioned at the time of admission.  Etiology unclear.  Echocardiogram without any significant abnormalities.  Telemetry without any arrhythmias.    Infected tooth/leukocytosis Noted to have elevated WBC.  Apparently has been having tooth ache for several days.  Patient has been started on antibiotics with cefdinir and metronidazole.   WBC noted to be normal. She will need outpatient follow-up  with dentist.  Essential hypertension/hyponatremia Lisinopril on hold.  Can be resumed at discharge. Monitor sodium levels.  Normocytic anemia No evidence for overt bleeding.  Drop in hemoglobin is likely dilutional.  Nausea vomiting and diarrhea Could have been gastroenteritis.  No recurrence here in the hospital.   Dementia Has poor short-term memory.  Stable.  DVT Prophylaxis: Lovenox Code Status: DNR Family Communication: No family at bedside Disposition Plan: Here from skilled nursing facility.  Anticipate discharge back to SNF after being cleared by neurology.  Being assessed for possible loop recorder placement.  Status is: Inpatient Remains inpatient appropriate because: Stroke workup, syncope workup     Medications: Scheduled:  aspirin  81 mg Oral Daily   cefdinir  300 mg Oral Q12H   enoxaparin (LOVENOX) injection  30 mg Subcutaneous Q24H   fluticasone  2 spray Each Nare Daily   melatonin  3 mg Oral QHS   metroNIDAZOLE  500 mg Oral Q12H   polyvinyl alcohol  1 drop Both Eyes TID   rosuvastatin  5 mg Oral Daily   saccharomyces boulardii  250 mg Oral BID   Continuous: [id number] **OR** acetaminophen (TYLENOL) oral liquid 160 mg/5 mL **OR** acetaminophen  Antibiotics: Anti-infectives (From admission, onward)    Start     Dose/Rate Route Frequency Ordered Stop   03/02/23 0000  metroNIDAZOLE (FLAGYL) 500 MG tablet        500 mg Oral Every 12 hours 03/02/23 0952 03/07/23 2359   03/02/23 0000  cefdinir (OMNICEF) 300 MG capsule        300 mg Oral Every 12 hours 03/02/23 0952 03/07/23 2359   02/28/23 2200  cefdinir (OMNICEF) capsule 300  mg        300 mg Oral Every 12 hours 02/28/23 1931     02/28/23 2200  metroNIDAZOLE (FLAGYL) tablet 500 mg        500 mg Oral Every 12 hours 02/28/23 1931         Objective:  Vital Signs  Vitals:   03/01/23 2333 03/02/23 0000 03/02/23 0346 03/02/23 0718  BP: (!) 116/55  128/66 (!) 120/59  Pulse: 74  69 65  Resp: '16  16 16 16  '$ Temp: 98.7 F (37.1 C)  97.9 F (36.6 C) 98 F (36.7 C)  TempSrc: Oral  Oral Oral  SpO2: 97%  98% 98%  Weight:      Height:        Intake/Output Summary (Last 24 hours) at 03/02/2023 0952 Last data filed at 03/02/2023 [id number] Gross per 24 hour  Intake 720 ml  Output --  Net 720 ml    Filed Weights   03/01/23 0800  Weight: 47 kg    General appearance: Awake alert.  In no distress Resp: Clear to auscultation bilaterally.  Normal effort Cardio: S1-S2 is normal regular.  No S3-S4.  No rubs murmurs or bruit GI: Abdomen is soft.  Nontender nondistended.  Bowel sounds are present normal.  No masses organomegaly    Lab Results:  Data Reviewed: I have personally reviewed following labs and reports of the imaging studies  CBC: Recent Labs  Lab 02/28/23 0645 03/01/23 0428 03/02/23 0833  WBC 22.4* 6.5 4.6  NEUTROABS 20.0*  --   --   HGB 11.3* 9.3* 10.3*  HCT 36.0 28.5* 32.4*  MCV 88.9 86.9 87.3  PLT 364 324 379     Basic Metabolic Panel: Recent Labs  Lab 02/28/23 0645 03/01/23 0428  NA 131* 133*  K 4.6 3.8  CL 102 103  CO2 24 17*  GLUCOSE 146* 86  BUN 20 12  CREATININE 0.87 0.82  CALCIUM 8.8* 8.5*     GFR: Estimated Creatinine Clearance: 31.4 mL/min (by C-G formula based on SCr of 0.82 mg/dL).  Liver Function Tests: Recent Labs  Lab 02/28/23 0645  AST 35  ALT 14  ALKPHOS 51  BILITOT 0.9  PROT 6.3*  ALBUMIN 2.8*     Recent Labs  Lab 02/28/23 0645  LIPASE 35     CBG: Recent Labs  Lab 02/28/23 0653  GLUCAP 141*     Lipid Profile: Recent Labs    03/01/23 0428  CHOL 148  HDL 46  LDLCALC 86  TRIG 80  CHOLHDL 3.2      Recent Results (from the past 240 hour(s))  Resp panel by RT-PCR (RSV, Flu A&B, Covid) Anterior Nasal Swab     Status: None   Collection Time: 02/28/23  6:58 AM   Specimen: Anterior Nasal Swab  Result Value Ref Range Status   SARS Coronavirus 2 by RT PCR NEGATIVE NEGATIVE Final   Influenza A by PCR  NEGATIVE NEGATIVE Final   Influenza B by PCR NEGATIVE NEGATIVE Final    Comment: (NOTE) The Xpert Xpress SARS-CoV-2/FLU/RSV plus assay is intended as an aid in the diagnosis of influenza from Nasopharyngeal swab specimens and should not be used as a sole basis for treatment. Nasal washings and aspirates are unacceptable for Xpert Xpress SARS-CoV-2/FLU/RSV testing.  Fact Sheet for Patients: EntrepreneurPulse.com.au  Fact Sheet for Healthcare Providers: IncredibleEmployment.be  This test is not yet approved or cleared by the [place] FDA and has been authorized for detection and/or diagnosis of  SARS-CoV-2 by FDA under an Emergency Use Authorization (EUA). This EUA will remain in effect (meaning this test can be used) for the duration of the COVID-19 declaration under Section 564(b)(1) of the Act, 21 U.S.C. section 360bbb-3(b)(1), unless the authorization is terminated or revoked.     Resp Syncytial Virus by PCR NEGATIVE NEGATIVE Final    Comment: (NOTE) Fact Sheet for Patients: EntrepreneurPulse.com.au  Fact Sheet for Healthcare Providers: IncredibleEmployment.be  This test is not yet approved or cleared by the [place] FDA and has been authorized for detection and/or diagnosis of SARS-CoV-2 by FDA under an Emergency Use Authorization (EUA). This EUA will remain in effect (meaning this test can be used) for the duration of the COVID-19 declaration under Section 564(b)(1) of the Act, 21 U.S.C. section 360bbb-3(b)(1), unless the authorization is terminated or revoked.  Performed at [facility], [place] [address] [place], [place] [address]   Culture, blood (single) w Reflex to ID Panel     Status: None (Preliminary result)   Collection Time: 02/28/23  1:22 PM   Specimen: BLOOD  Result Value Ref Range Status   Specimen Description BLOOD BLOOD RIGHT FOREARM  Final   Special Requests   Final     BOTTLES DRAWN AEROBIC AND ANAEROBIC Blood Culture adequate volume   Culture   Final    NO GROWTH 2 DAYS Performed at [facility], [place] [address], [place],  [address]    Report Status PENDING  Incomplete      Radiology Studies: VAS [place] LOWER EXTREMITY VENOUS (DVT)  Result Date: 03/01/2023  Lower Venous DVT Study Patient Name:  [name]  Date of Exam:   03/01/2023 Medical Rec #: [id number]              Accession #:    [id number] Date of Birth: [birth year]              Patient Gender: F Patient Age:   27 years Exam Location:  [facility] Procedure:      VAS [place] LOWER EXTREMITY VENOUS (DVT) Referring Phys: Cornelius Moras XU --------------------------------------------------------------------------------  Indications: Stroke.  Risk Factors: None identified. Comparison Study: No prior studies. Performing Technologist: Oliver Hum RVT  Examination Guidelines: A complete evaluation includes B-mode imaging, spectral Doppler, color Doppler, and power Doppler as needed of all accessible portions of each vessel. Bilateral testing is considered an integral part of a complete examination. Limited examinations for reoccurring indications may be performed as noted. The reflux portion of the exam is performed with the patient in reverse Trendelenburg.  +---------+---------------+---------+-----------+----------+--------------+ RIGHT    CompressibilityPhasicitySpontaneityPropertiesThrombus Aging +---------+---------------+---------+-----------+----------+--------------+ CFV      Full           Yes      Yes                                 +---------+---------------+---------+-----------+----------+--------------+ SFJ      Full                                                        +---------+---------------+---------+-----------+----------+--------------+ FV Prox  Full                                                         +---------+---------------+---------+-----------+----------+--------------+  FV Mid   Full                                                        +---------+---------------+---------+-----------+----------+--------------+ FV DistalFull                                                        +---------+---------------+---------+-----------+----------+--------------+ PFV      Full                                                        +---------+---------------+---------+-----------+----------+--------------+ POP      Full           Yes      Yes                                 +---------+---------------+---------+-----------+----------+--------------+ PTV      Full                                                        +---------+---------------+---------+-----------+----------+--------------+ PERO     Full                                                        +---------+---------------+---------+-----------+----------+--------------+   +---------+---------------+---------+-----------+----------+--------------+ LEFT     CompressibilityPhasicitySpontaneityPropertiesThrombus Aging +---------+---------------+---------+-----------+----------+--------------+ CFV      Full           Yes      Yes                                 +---------+---------------+---------+-----------+----------+--------------+ SFJ      Full                                                        +---------+---------------+---------+-----------+----------+--------------+ FV Prox  Full                                                        +---------+---------------+---------+-----------+----------+--------------+ FV Mid   Full                                                        +---------+---------------+---------+-----------+----------+--------------+  FV DistalFull                                                         +---------+---------------+---------+-----------+----------+--------------+ PFV      Full                                                        +---------+---------------+---------+-----------+----------+--------------+ POP      Full           Yes      Yes                                 +---------+---------------+---------+-----------+----------+--------------+ PTV      Full                                                        +---------+---------------+---------+-----------+----------+--------------+ PERO     Full                                                        +---------+---------------+---------+-----------+----------+--------------+     Summary: RIGHT: - There is no evidence of deep vein thrombosis in the lower extremity.  - No cystic structure found in the popliteal fossa.  LEFT: - There is no evidence of deep vein thrombosis in the lower extremity.  - No cystic structure found in the popliteal fossa.  *See table(s) above for measurements and observations. Electronically signed by Servando Snare MD on 03/01/2023 at 7:23:47 PM.    Final    DG HIP UNILAT WITH PELVIS 1V LEFT  Result Date: 03/01/2023 CLINICAL DATA:  Pelvis and left hip pain. EXAM: DG HIP (WITH OR WITHOUT PELVIS) 1V*L* COMPARISON:  Pelvis and right hip radiographs 12/31/2022 FINDINGS: There is diffuse decreased bone mineralization. Mild bilateral superomedial femoroacetabular joint space narrowing is unchanged from prior. Mild bilateral sacroiliac subchondral sclerosis without significant joint space narrowing. The pubic symphysis joint space is maintained. No acute fracture is seen. No dislocation. IMPRESSION: 1. No acute fracture. 2. Mild bilateral femoroacetabular osteoarthritis. Electronically Signed   By: Yvonne Kendall M.D.   On: 03/01/2023 16:57   ECHOCARDIOGRAM COMPLETE  Result Date: 03/01/2023    ECHOCARDIOGRAM REPORT   Patient Name:   [name] Date of Exam: 03/01/2023 Medical Rec #:   [id number]             Height:       60.0 in Accession #:    [id number]            Weight:       106.9 lb Date of Birth:  08-10-1928             BSA:          1.430 m Patient Age:    51 years  BP:           101/66 mmHg Patient Gender: F                     HR:           74 bpm. Exam Location:  Inpatient Procedure: 2D Echo Indications:    stroke  History:        Patient has prior history of Echocardiogram examinations, most                 recent 09/18/2016. Stroke; Risk Factors:Hypertension.  Sonographer:    Harvie Junior Referring Phys: Fuller Plan, A IMPRESSIONS  1. Left ventricular ejection fraction, by estimation, is 65 to 70%. The left ventricle has normal function. The left ventricle has no regional wall motion abnormalities. Left ventricular diastolic parameters are indeterminate.  2. Right ventricular systolic function is normal. The right ventricular size is normal. There is normal pulmonary artery systolic pressure.  3. Trivial mitral valve regurgitation.  4. The aortic valve is tricuspid. Aortic valve regurgitation is mild. Aortic valve sclerosis/calcification is present, without any evidence of aortic stenosis. FINDINGS  Left Ventricle: Left ventricular ejection fraction, by estimation, is 65 to 70%. The left ventricle has normal function. The left ventricle has no regional wall motion abnormalities. The left ventricular internal cavity size was normal in size. There is  no left ventricular hypertrophy. Left ventricular diastolic parameters are indeterminate. Right Ventricle: The right ventricular size is normal. Right vetricular wall thickness was not assessed. Right ventricular systolic function is normal. There is normal pulmonary artery systolic pressure. The tricuspid regurgitant velocity is 1.66 m/s, and with an assumed right atrial pressure of 3 mmHg, the estimated right ventricular systolic pressure is 0000000 mmHg. Left Atrium: Left atrial size was normal in size. Right Atrium: Right  atrial size was normal in size. Pericardium: There is no evidence of pericardial effusion. Mitral Valve: Mild mitral annular calcification. Trivial mitral valve regurgitation. Tricuspid Valve: The tricuspid valve is normal in structure. Tricuspid valve regurgitation is trivial. Aortic Valve: The aortic valve is tricuspid. Aortic valve regurgitation is mild. Aortic regurgitation PHT measures 415 msec. Aortic valve sclerosis/calcification is present, without any evidence of aortic stenosis. Aortic valve mean gradient measures 3.0  mmHg. Aortic valve peak gradient measures 5.1 mmHg. Aortic valve area, by VTI measures 2.03 cm. Pulmonic Valve: The pulmonic valve was not well visualized. Pulmonic valve regurgitation is not visualized. No evidence of pulmonic stenosis. Aorta: The aortic root and ascending aorta are structurally normal, with no evidence of dilitation. IAS/Shunts: No atrial level shunt detected by color flow Doppler.  LEFT VENTRICLE PLAX 2D LVIDd:         4.30 cm     Diastology LVIDs:         2.50 cm     LV e' medial:    6.20 cm/s LV PW:         0.80 cm     LV E/e' medial:  15.0 LV IVS:        0.80 cm     LV e' lateral:   6.31 cm/s LVOT diam:     1.80 cm     LV E/e' lateral: 14.8 LV SV:         45 LV SV Index:   31 LVOT Area:     2.54 cm  3D Volume EF: LV Volumes (MOD)           3D EF:        60 % LV vol d, MOD A2C: 43.3 ml LV EDV:       67 ml LV vol d, MOD A4C: 64.7 ml LV ESV:       27 ml LV vol s, MOD A2C: 16.0 ml LV SV:        40 ml LV vol s, MOD A4C: 20.8 ml LV SV MOD A2C:     27.3 ml LV SV MOD A4C:     64.7 ml LV SV MOD BP:      35.9 ml RIGHT VENTRICLE RV Basal diam:  2.50 cm RV Mid diam:    2.40 cm RV S prime:     10.80 cm/s TAPSE (M-mode): 2.1 cm LEFT ATRIUM             Index        RIGHT ATRIUM          Index LA diam:        3.00 cm 2.10 cm/m   RA Area:     7.72 cm LA Vol (A2C):   45.0 ml 31.46 ml/m  RA Volume:   13.30 ml 9.30 ml/m LA Vol (A4C):   35.0 ml 24.47 ml/m  LA Biplane Vol: 40.3 ml 28.17 ml/m  AORTIC VALVE                    PULMONIC VALVE AV Area (Vmax):    1.85 cm     PV Vmax:       0.78 m/s AV Area (Vmean):   1.88 cm     PV Peak grad:  2.4 mmHg AV Area (VTI):     2.03 cm AV Vmax:           113.00 cm/s AV Vmean:          79.100 cm/s AV VTI:            0.219 m AV Peak Grad:      5.1 mmHg AV Mean Grad:      3.0 mmHg LVOT Vmax:         82.10 cm/s LVOT Vmean:        58.300 cm/s LVOT VTI:          0.175 m LVOT/AV VTI ratio: 0.80 AI PHT:            415 msec  AORTA Ao Root diam: 3.10 cm Ao Asc diam:  3.10 cm MITRAL VALVE               TRICUSPID VALVE MV Area (PHT): 3.85 cm    TR Peak grad:   11.0 mmHg MV Decel Time: 197 msec    TR Vmax:        166.00 cm/s MV E velocity: 93.20 cm/s MV A velocity: 78.00 cm/s  SHUNTS MV E/A ratio:  1.19        Systemic VTI:  0.18 m                            Systemic Diam: 1.80 cm Dorris Carnes MD Electronically signed by Dorris Carnes MD Signature Date/Time: 03/01/2023/11:36:11 AM    Final    CT ANGIO HEAD NECK W WO CM  Result Date: 02/28/2023 CLINICAL DATA:  Stroke seen on MRI. EXAM: CT ANGIOGRAPHY HEAD AND NECK TECHNIQUE: Multidetector CT imaging  of the head and neck was performed using the standard protocol during bolus administration of intravenous contrast. Multiplanar CT image reconstructions and MIPs were obtained to evaluate the vascular anatomy. Carotid stenosis measurements (when applicable) are obtained utilizing NASCET criteria, using the distal internal carotid diameter as the denominator. RADIATION DOSE REDUCTION: This exam was performed according to the departmental dose-optimization program which includes automated exposure control, adjustment of the mA and/or kV according to patient size and/or use of iterative reconstruction technique. CONTRAST:  82m OMNIPAQUE IOHEXOL 350 MG/ML SOLN COMPARISON:  Brain MRI 1 day prior.  MRA head 09/16/2016 FINDINGS: CTA NECK FINDINGS Aortic arch: There is mild calcified plaque in the imaged  aortic arch. The origins of the major branch vessels are patent. The subclavian arteries are patent to the level imaged. Right carotid system: The right common, internal, and external carotid arteries are patent, with mild plaque at the bifurcation but no hemodynamically significant stenosis or occlusion there is no evidence of dissection or aneurysm. Left carotid system: The left common, internal, and external carotid arteries are patent, with mild plaque of the bifurcation but no hemodynamically significant stenosis or occlusion there is no evidence of dissection or aneurysm. Vertebral arteries: The vertebral arteries are patent, without hemodynamically significant stenosis or occlusion. There is no evidence of dissection or aneurysm. Skeleton: There is no acute osseous abnormality or suspicious osseous lesion. There is no visible canal hematoma. Other neck: The soft tissues of the neck are unremarkable. Upper chest: There is scarring in the lung apices. The lung apices are otherwise clear. Review of the MIP images confirms the above findings CTA HEAD FINDINGS Anterior circulation: There is calcified plaque in the carotid siphons without significant stenosis or occlusion. The bilateral M1 segments are patent. There is short-segment severe stenosis of the right M3 branch distally in the sylvian fissure (8-111). There is otherwise overall mild atherosclerotic irregularity and narrowing in the bilateral MCA branches. The left A1 segment is diminutive, likely developmental. There is short-segment moderate stenosis of the left A2 segment (7-65) and focal severe stenosis of the left A3 segment (7-44). There is no aneurysm or AVM. Posterior circulation: The bilateral V4 segments are patent. The basilar artery is patent. The major cerebellar arteries are patent. The PCAs are patent, with mild atherosclerotic irregularity and narrowing of the distal branches but no proximal high-grade stenosis or occlusion. There is a  fetal origin of the right PCA. There is no aneurysm or AVM. Venous sinuses: Patent. Anatomic variants: As above. Review of the MIP images confirms the above findings IMPRESSION: 1. Intracranial atherosclerotic disease resulting in short-segment severe stenosis of a right M3 branch, moderate stenosis of the left A2 segment, and focal severe stenosis of the left A3 segment. Mild atherosclerotic irregularity and narrowing elsewhere in the intracranial vasculature. 2. Mild atherosclerotic plaque at the carotid bifurcations without hemodynamically significant stenosis or occlusion. Electronically Signed   By: PValetta MoleM.D.   On: 02/28/2023 14:57   MR BRAIN W WO CONTRAST  Result Date: 02/28/2023 CLINICAL DATA:  Possible acute infarct on CT. EXAM: MRI HEAD WITHOUT AND WITH CONTRAST TECHNIQUE: Multiplanar, multiecho pulse sequences of the brain and surrounding structures were obtained without and with intravenous contrast. CONTRAST:  561mGADAVIST GADOBUTROL 1 MMOL/ML IV SOLN COMPARISON:  Head CT from earlier today FINDINGS: Brain: Patchy acute infarcts along the right sylvian fissure, subcentimeter. Additional subcentimeter acute infarct in the left parietal cortex. Larger and more confluent area of more weakly restricted diffusion along the left  occipital cortex with surface enhancement. Chronic small vessel ischemia in the cerebral white matter that is confluent. Small remote bifrontal cortically based infarcts. Chronic lacune in the left centrum semiovale. Mild for age cerebral volume loss. Scattered microhemorrhages mainly in the deep brain and attributed to ischemic insults. Small chronic bilateral cerebellar infarcts. Vascular: Major flow voids are preserved. Skull and upper cervical spine: No focal marrow lesion. Sinuses/Orbits: No acute finding IMPRESSION: 1. Small acute infarcts in the right perisylvian and left parietal cortex. 2. Moderate subacute infarct in the left occipital cortex. 3. Advanced chronic  ischemic injury with multiple chronic infarcts. Electronically Signed   By: Jorje Guild M.D.   On: 02/28/2023 10:47       LOS: 2 days   [facility] Pager on [email or web address]  03/02/2023, 9:52 AM

## 2023-03-02 NOTE — Discharge Summary (Signed)
[facility]  Physician Discharge Summary   Patient ID: [name] MRN: [id number] DOB/AGE: 87-08-1928 87 y.o.  Admit date: 02/28/2023 Discharge date:   03/02/2023   PCP: Corliss Blacker, MD  DISCHARGE DIAGNOSES:    CVA (cerebral vascular accident) [facility])   Infected tooth   Hypertension   Dementia without behavioral disturbance ([place])   RECOMMENDATIONS FOR OUTPATIENT FOLLOW UP: Cardiology to arrange outpatient follow-up Neurology to arrange outpatient follow-up.  Ambulatory referral has been sent.    Home Health: SNF Equipment/Devices: None  CODE STATUS: DNR  DISCHARGE CONDITION: fair  Diet recommendation: Dysphagia 3 diet with thin liquids  INITIAL HISTORY: 89 y.o. female with medical history significant of HTN, CVA, osteoporosis, and dementia who presented after having episode of unresponsiveness.  Apparently in January of this year patient had 2 falls and had been sent to [facility].  Currently she is in the skilled nursing part.  At baseline patient ambulates with the use of wheelchair/walker and always with assistance.  Her daughter notes that she wears partials on the top and she has a decaying tooth that is infected that they have been trying to get dental to see her for weeks now. EMS had noted patient had reportedly had episodes of nausea, vomiting, and diarrhea since 6 AM this morning.   Consultants: Neurology, cardiology   Procedures: Echocardiogram    HOSPITAL COURSE:   Acute CVA MRI showed acute infarcts.  Patient underwent CT angiogram head and neck. LDL is 86.  HbA1c is 6.0. EKG shows sinus rhythm. Patient is on aspirin and statin.  No dual antiplatelet therapy due to hemorrhagic transformation. PT OT evaluation. Echocardiogram shows normal systolic function of the left ventricle.  No significant valvular abnormalities noted.. Neurology is following.  They have contacted cardiology to consider loop recorder placement or a heart  monitor.  Should be okay for discharge after this has been determined..   Syncope Episode of unresponsiveness was mentioned at the time of admission.  Etiology unclear.  Echocardiogram without any significant abnormalities.  Telemetry without any arrhythmias.     Infected tooth/leukocytosis Noted to have elevated WBC.  Apparently has been having tooth ache for several days.  Patient has been started on antibiotics with cefdinir and metronidazole.   WBC noted to be normal. She will need outpatient follow-up with dentist.   Essential hypertension/hyponatremia Lisinopril on hold.  Can be resumed at discharge. Sodium levels are stable.   Normocytic anemia No evidence for overt bleeding.  Drop in hemoglobin is likely dilutional.  Noted to be stable today.   Nausea vomiting and diarrhea Could have been gastroenteritis.  No recurrence here in the hospital.    Dementia Has poor short-term memory.  Stable.   Patient is stable.  Okay for discharge to SNF after cleared by cardiology and neurology.     PERTINENT LABS:  The results of significant diagnostics from this hospitalization (including imaging, microbiology, ancillary and laboratory) are listed below for reference.    Microbiology: Recent Results (from the past 240 hour(s))  Resp panel by RT-PCR (RSV, Flu A&B, Covid) Anterior Nasal Swab     Status: None   Collection Time: 02/28/23  6:58 AM   Specimen: Anterior Nasal Swab  Result Value Ref Range Status   SARS Coronavirus 2 by RT PCR NEGATIVE NEGATIVE Final   Influenza A by PCR NEGATIVE NEGATIVE Final   Influenza B by PCR NEGATIVE NEGATIVE Final    Comment: (NOTE) The Xpert Xpress SARS-CoV-2/FLU/RSV plus assay is intended  as an aid in the diagnosis of influenza from Nasopharyngeal swab specimens and should not be used as a sole basis for treatment. Nasal washings and aspirates are unacceptable for Xpert Xpress SARS-CoV-2/FLU/RSV testing.  Fact Sheet for  Patients: EntrepreneurPulse.com.au  Fact Sheet for Healthcare Providers: IncredibleEmployment.be  This test is not yet approved or cleared by the [place] FDA and has been authorized for detection and/or diagnosis of SARS-CoV-2 by FDA under an Emergency Use Authorization (EUA). This EUA will remain in effect (meaning this test can be used) for the duration of the COVID-19 declaration under Section 564(b)(1) of the Act, 21 U.S.C. section 360bbb-3(b)(1), unless the authorization is terminated or revoked.     Resp Syncytial Virus by PCR NEGATIVE NEGATIVE Final    Comment: (NOTE) Fact Sheet for Patients: EntrepreneurPulse.com.au  Fact Sheet for Healthcare Providers: IncredibleEmployment.be  This test is not yet approved or cleared by the [place] FDA and has been authorized for detection and/or diagnosis of SARS-CoV-2 by FDA under an Emergency Use Authorization (EUA). This EUA will remain in effect (meaning this test can be used) for the duration of the COVID-19 declaration under Section 564(b)(1) of the Act, 21 U.S.C. section 360bbb-3(b)(1), unless the authorization is terminated or revoked.  Performed at [facility], [place] [address] [place], [place] [address]   Culture, blood (single) w Reflex to ID Panel     Status: None (Preliminary result)   Collection Time: 02/28/23  1:22 PM   Specimen: BLOOD  Result Value Ref Range Status   Specimen Description BLOOD BLOOD RIGHT FOREARM  Final   Special Requests   Final    BOTTLES DRAWN AEROBIC AND ANAEROBIC Blood Culture adequate volume   Culture   Final    NO GROWTH 2 DAYS Performed at [facility], [place] [address], [place], [place] [address]    Report Status PENDING  Incomplete     Labs:   Basic Metabolic Panel: Recent Labs  Lab 02/28/23 0645 03/01/23 0428 03/02/23 0833  NA 131* 133* 134*  K 4.6 3.8 3.7  CL 102 103 103  CO2 24  17* 23  GLUCOSE 146* 86 99  BUN '20 12 11  '$ CREATININE 0.87 0.82 0.84  CALCIUM 8.8* 8.5* 8.8*  MG  --   --  1.8   Liver Function Tests: Recent Labs  Lab 02/28/23 0645  AST 35  ALT 14  ALKPHOS 51  BILITOT 0.9  PROT 6.3*  ALBUMIN 2.8*   Recent Labs  Lab 02/28/23 0645  LIPASE 35    CBC: Recent Labs  Lab 02/28/23 0645 03/01/23 0428 03/02/23 0833  WBC 22.4* 6.5 4.6  NEUTROABS 20.0*  --   --   HGB 11.3* 9.3* 10.3*  HCT 36.0 28.5* 32.4*  MCV 88.9 86.9 87.3  PLT 364 324 379     CBG: Recent Labs  Lab 02/28/23 0653  GLUCAP 141*     IMAGING STUDIES VAS [place] LOWER EXTREMITY VENOUS (DVT)  Result Date: 03/01/2023  Lower Venous DVT Study Patient Name:  DESTINE RIGGSBEE [facility]  Date of Exam:   03/01/2023 Medical Rec #: [id number]              Accession #:    [id number] Date of Birth: Aug 21, 1928              Patient Gender: F Patient Age:   65 years Exam Location:  [facility] Procedure:      VAS [place] LOWER EXTREMITY VENOUS (DVT) Referring  Phys: Rosalin Hawking --------------------------------------------------------------------------------  Indications: Stroke.  Risk Factors: None identified. Comparison Study: No prior studies. Performing Technologist: Oliver Hum RVT  Examination Guidelines: A complete evaluation includes B-mode imaging, spectral Doppler, color Doppler, and power Doppler as needed of all accessible portions of each vessel. Bilateral testing is considered an integral part of a complete examination. Limited examinations for reoccurring indications may be performed as noted. The reflux portion of the exam is performed with the patient in reverse Trendelenburg.  +---------+---------------+---------+-----------+----------+--------------+ RIGHT    CompressibilityPhasicitySpontaneityPropertiesThrombus Aging +---------+---------------+---------+-----------+----------+--------------+ CFV      Full           Yes      Yes                                  +---------+---------------+---------+-----------+----------+--------------+ SFJ      Full                                                        +---------+---------------+---------+-----------+----------+--------------+ FV Prox  Full                                                        +---------+---------------+---------+-----------+----------+--------------+ FV Mid   Full                                                        +---------+---------------+---------+-----------+----------+--------------+ FV DistalFull                                                        +---------+---------------+---------+-----------+----------+--------------+ PFV      Full                                                        +---------+---------------+---------+-----------+----------+--------------+ POP      Full           Yes      Yes                                 +---------+---------------+---------+-----------+----------+--------------+ PTV      Full                                                        +---------+---------------+---------+-----------+----------+--------------+ PERO     Full                                                        +---------+---------------+---------+-----------+----------+--------------+   +---------+---------------+---------+-----------+----------+--------------+  LEFT     CompressibilityPhasicitySpontaneityPropertiesThrombus Aging +---------+---------------+---------+-----------+----------+--------------+ CFV      Full           Yes      Yes                                 +---------+---------------+---------+-----------+----------+--------------+ SFJ      Full                                                        +---------+---------------+---------+-----------+----------+--------------+ FV Prox  Full                                                         +---------+---------------+---------+-----------+----------+--------------+ FV Mid   Full                                                        +---------+---------------+---------+-----------+----------+--------------+ FV DistalFull                                                        +---------+---------------+---------+-----------+----------+--------------+ PFV      Full                                                        +---------+---------------+---------+-----------+----------+--------------+ POP      Full           Yes      Yes                                 +---------+---------------+---------+-----------+----------+--------------+ PTV      Full                                                        +---------+---------------+---------+-----------+----------+--------------+ PERO     Full                                                        +---------+---------------+---------+-----------+----------+--------------+     Summary: RIGHT: - There is no evidence of deep vein thrombosis in the lower extremity.  - No cystic structure found in the popliteal fossa.  LEFT: - There is no evidence of deep vein thrombosis in the lower extremity.  - No  cystic structure found in the popliteal fossa.  *See table(s) above for measurements and observations. Electronically signed by Servando Snare MD on 03/01/2023 at 7:23:47 PM.    Final    DG HIP UNILAT WITH PELVIS 1V LEFT  Result Date: 03/01/2023 CLINICAL DATA:  Pelvis and left hip pain. EXAM: DG HIP (WITH OR WITHOUT PELVIS) 1V*L* COMPARISON:  Pelvis and right hip radiographs 12/31/2022 FINDINGS: There is diffuse decreased bone mineralization. Mild bilateral superomedial femoroacetabular joint space narrowing is unchanged from prior. Mild bilateral sacroiliac subchondral sclerosis without significant joint space narrowing. The pubic symphysis joint space is maintained. No acute fracture is seen. No dislocation. IMPRESSION: 1.  No acute fracture. 2. Mild bilateral femoroacetabular osteoarthritis. Electronically Signed   By: Yvonne Kendall M.D.   On: 03/01/2023 16:57   ECHOCARDIOGRAM COMPLETE  Result Date: 03/01/2023    ECHOCARDIOGRAM REPORT   Patient Name:   BELL BARSANTI Date of Exam: 03/01/2023 Medical Rec #:  [id number]             Height:       60.0 in Accession #:    [id number]            Weight:       106.9 lb Date of Birth:  28-Jan-1928             BSA:          1.430 m Patient Age:    66 years              BP:           101/66 mmHg Patient Gender: F                     HR:           74 bpm. Exam Location:  Inpatient Procedure: 2D Echo Indications:    stroke  History:        Patient has prior history of Echocardiogram examinations, most                 recent 09/18/2016. Stroke; Risk Factors:Hypertension.  Sonographer:    Harvie Junior Referring Phys: Fuller Plan, A IMPRESSIONS  1. Left ventricular ejection fraction, by estimation, is 65 to 70%. The left ventricle has normal function. The left ventricle has no regional wall motion abnormalities. Left ventricular diastolic parameters are indeterminate.  2. Right ventricular systolic function is normal. The right ventricular size is normal. There is normal pulmonary artery systolic pressure.  3. Trivial mitral valve regurgitation.  4. The aortic valve is tricuspid. Aortic valve regurgitation is mild. Aortic valve sclerosis/calcification is present, without any evidence of aortic stenosis. FINDINGS  Left Ventricle: Left ventricular ejection fraction, by estimation, is 65 to 70%. The left ventricle has normal function. The left ventricle has no regional wall motion abnormalities. The left ventricular internal cavity size was normal in size. There is  no left ventricular hypertrophy. Left ventricular diastolic parameters are indeterminate. Right Ventricle: The right ventricular size is normal. Right vetricular wall thickness was not assessed. Right ventricular systolic function is  normal. There is normal pulmonary artery systolic pressure. The tricuspid regurgitant velocity is 1.66 m/s, and with an assumed right atrial pressure of 3 mmHg, the estimated right ventricular systolic pressure is 0000000 mmHg. Left Atrium: Left atrial size was normal in size. Right Atrium: Right atrial size was normal in size. Pericardium: There is no evidence of pericardial effusion. Mitral Valve: Mild mitral annular calcification. Trivial mitral valve regurgitation.  Tricuspid Valve: The tricuspid valve is normal in structure. Tricuspid valve regurgitation is trivial. Aortic Valve: The aortic valve is tricuspid. Aortic valve regurgitation is mild. Aortic regurgitation PHT measures 415 msec. Aortic valve sclerosis/calcification is present, without any evidence of aortic stenosis. Aortic valve mean gradient measures 3.0  mmHg. Aortic valve peak gradient measures 5.1 mmHg. Aortic valve area, by VTI measures 2.03 cm. Pulmonic Valve: The pulmonic valve was not well visualized. Pulmonic valve regurgitation is not visualized. No evidence of pulmonic stenosis. Aorta: The aortic root and ascending aorta are structurally normal, with no evidence of dilitation. IAS/Shunts: No atrial level shunt detected by color flow Doppler.  LEFT VENTRICLE PLAX 2D LVIDd:         4.30 cm     Diastology LVIDs:         2.50 cm     LV e' medial:    6.20 cm/s LV PW:         0.80 cm     LV E/e' medial:  15.0 LV IVS:        0.80 cm     LV e' lateral:   6.31 cm/s LVOT diam:     1.80 cm     LV E/e' lateral: 14.8 LV SV:         45 LV SV Index:   31 LVOT Area:     2.54 cm                             3D Volume EF: LV Volumes (MOD)           3D EF:        60 % LV vol d, MOD A2C: 43.3 ml LV EDV:       67 ml LV vol d, MOD A4C: 64.7 ml LV ESV:       27 ml LV vol s, MOD A2C: 16.0 ml LV SV:        40 ml LV vol s, MOD A4C: 20.8 ml LV SV MOD A2C:     27.3 ml LV SV MOD A4C:     64.7 ml LV SV MOD BP:      35.9 ml RIGHT VENTRICLE RV Basal diam:  2.50 cm RV Mid  diam:    2.40 cm RV S prime:     10.80 cm/s TAPSE (M-mode): 2.1 cm LEFT ATRIUM             Index        RIGHT ATRIUM          Index LA diam:        3.00 cm 2.10 cm/m   RA Area:     7.72 cm LA Vol (A2C):   45.0 ml 31.46 ml/m  RA Volume:   13.30 ml 9.30 ml/m LA Vol (A4C):   35.0 ml 24.47 ml/m LA Biplane Vol: 40.3 ml 28.17 ml/m  AORTIC VALVE                    PULMONIC VALVE AV Area (Vmax):    1.85 cm     PV Vmax:       0.78 m/s AV Area (Vmean):   1.88 cm     PV Peak grad:  2.4 mmHg AV Area (VTI):     2.03 cm AV Vmax:           113.00 cm/s AV Vmean:  79.100 cm/s AV VTI:            0.219 m AV Peak Grad:      5.1 mmHg AV Mean Grad:      3.0 mmHg LVOT Vmax:         82.10 cm/s LVOT Vmean:        58.300 cm/s LVOT VTI:          0.175 m LVOT/AV VTI ratio: 0.80 AI PHT:            415 msec  AORTA Ao Root diam: 3.10 cm Ao Asc diam:  3.10 cm MITRAL VALVE               TRICUSPID VALVE MV Area (PHT): 3.85 cm    TR Peak grad:   11.0 mmHg MV Decel Time: 197 msec    TR Vmax:        166.00 cm/s MV E velocity: 93.20 cm/s MV A velocity: 78.00 cm/s  SHUNTS MV E/A ratio:  1.19        Systemic VTI:  0.18 m                            Systemic Diam: 1.80 cm Dorris Carnes MD Electronically signed by Dorris Carnes MD Signature Date/Time: 03/01/2023/11:36:11 AM    Final    CT ANGIO HEAD NECK W WO CM  Result Date: 02/28/2023 CLINICAL DATA:  Stroke seen on MRI. EXAM: CT ANGIOGRAPHY HEAD AND NECK TECHNIQUE: Multidetector CT imaging of the head and neck was performed using the standard protocol during bolus administration of intravenous contrast. Multiplanar CT image reconstructions and MIPs were obtained to evaluate the vascular anatomy. Carotid stenosis measurements (when applicable) are obtained utilizing NASCET criteria, using the distal internal carotid diameter as the denominator. RADIATION DOSE REDUCTION: This exam was performed according to the departmental dose-optimization program which includes automated exposure control,  adjustment of the mA and/or kV according to patient size and/or use of iterative reconstruction technique. CONTRAST:  73m OMNIPAQUE IOHEXOL 350 MG/ML SOLN COMPARISON:  Brain MRI 1 day prior.  MRA head 09/16/2016 FINDINGS: CTA NECK FINDINGS Aortic arch: There is mild calcified plaque in the imaged aortic arch. The origins of the major branch vessels are patent. The subclavian arteries are patent to the level imaged. Right carotid system: The right common, internal, and external carotid arteries are patent, with mild plaque at the bifurcation but no hemodynamically significant stenosis or occlusion there is no evidence of dissection or aneurysm. Left carotid system: The left common, internal, and external carotid arteries are patent, with mild plaque of the bifurcation but no hemodynamically significant stenosis or occlusion there is no evidence of dissection or aneurysm. Vertebral arteries: The vertebral arteries are patent, without hemodynamically significant stenosis or occlusion. There is no evidence of dissection or aneurysm. Skeleton: There is no acute osseous abnormality or suspicious osseous lesion. There is no visible canal hematoma. Other neck: The soft tissues of the neck are unremarkable. Upper chest: There is scarring in the lung apices. The lung apices are otherwise clear. Review of the MIP images confirms the above findings CTA HEAD FINDINGS Anterior circulation: There is calcified plaque in the carotid siphons without significant stenosis or occlusion. The bilateral M1 segments are patent. There is short-segment severe stenosis of the right M3 branch distally in the sylvian fissure (8-111). There is otherwise overall mild atherosclerotic irregularity and narrowing in the bilateral MCA branches. The left A1 segment  is diminutive, likely developmental. There is short-segment moderate stenosis of the left A2 segment (7-65) and focal severe stenosis of the left A3 segment (7-44). There is no aneurysm or  AVM. Posterior circulation: The bilateral V4 segments are patent. The basilar artery is patent. The major cerebellar arteries are patent. The PCAs are patent, with mild atherosclerotic irregularity and narrowing of the distal branches but no proximal high-grade stenosis or occlusion. There is a fetal origin of the right PCA. There is no aneurysm or AVM. Venous sinuses: Patent. Anatomic variants: As above. Review of the MIP images confirms the above findings IMPRESSION: 1. Intracranial atherosclerotic disease resulting in short-segment severe stenosis of a right M3 branch, moderate stenosis of the left A2 segment, and focal severe stenosis of the left A3 segment. Mild atherosclerotic irregularity and narrowing elsewhere in the intracranial vasculature. 2. Mild atherosclerotic plaque at the carotid bifurcations without hemodynamically significant stenosis or occlusion. Electronically Signed   By: Valetta Mole M.D.   On: 02/28/2023 14:57   MR BRAIN W WO CONTRAST  Result Date: 02/28/2023 CLINICAL DATA:  Possible acute infarct on CT. EXAM: MRI HEAD WITHOUT AND WITH CONTRAST TECHNIQUE: Multiplanar, multiecho pulse sequences of the brain and surrounding structures were obtained without and with intravenous contrast. CONTRAST:  58m GADAVIST GADOBUTROL 1 MMOL/ML IV SOLN COMPARISON:  Head CT from earlier today FINDINGS: Brain: Patchy acute infarcts along the right sylvian fissure, subcentimeter. Additional subcentimeter acute infarct in the left parietal cortex. Larger and more confluent area of more weakly restricted diffusion along the left occipital cortex with surface enhancement. Chronic small vessel ischemia in the cerebral white matter that is confluent. Small remote bifrontal cortically based infarcts. Chronic lacune in the left centrum semiovale. Mild for age cerebral volume loss. Scattered microhemorrhages mainly in the deep brain and attributed to ischemic insults. Small chronic bilateral cerebellar infarcts.  Vascular: Major flow voids are preserved. Skull and upper cervical spine: No focal marrow lesion. Sinuses/Orbits: No acute finding IMPRESSION: 1. Small acute infarcts in the right perisylvian and left parietal cortex. 2. Moderate subacute infarct in the left occipital cortex. 3. Advanced chronic ischemic injury with multiple chronic infarcts. Electronically Signed   By: JJorje GuildM.D.   On: 02/28/2023 10:47   CT Head Wo Contrast  Result Date: 02/28/2023 CLINICAL DATA:  Syncope EXAM: CT HEAD WITHOUT CONTRAST CT CERVICAL SPINE WITHOUT CONTRAST TECHNIQUE: Multidetector CT imaging of the head and cervical spine was performed following the standard protocol without intravenous contrast. Multiplanar CT image reconstructions of the cervical spine were also generated. RADIATION DOSE REDUCTION: This exam was performed according to the departmental dose-optimization program which includes automated exposure control, adjustment of the mA and/or kV according to patient size and/or use of iterative reconstruction technique. COMPARISON:  No comparison studies available. FINDINGS: CT HEAD FINDINGS Brain: Diffuse loss of parenchymal volume is consistent with atrophy. Patchy low attenuation in the deep hemispheric and periventricular white matter is nonspecific, but likely reflects chronic microvascular ischemic demyelination. There is new hypodensity in the left occipital lobe inferiorly. Stable lacunar infarct right basal ganglia. Tiny gas locules in the region of the sella turcica presumably in the cavernous sinuses secondary to IV placement. Vascular: No hyperdense vessel or unexpected calcification. Skull: No evidence for fracture. No worrisome lytic or sclerotic lesion. Sinuses/Orbits: Chronic mucosal thickening noted inferior right maxillary sinus. Visualized portions of the globes and intraorbital fat are unremarkable. Other: None. CT CERVICAL SPINE FINDINGS Alignment: Trace anterolisthesis of C7 on T1. Skull base  and  vertebrae: No acute fracture. No primary bone lesion or focal pathologic process. Soft tissues and spinal canal: No prevertebral fluid or swelling. No visible canal hematoma. Disc levels: Loss of disc height with endplate degeneration noted C6-7. Upper chest: Biapical pleuroparenchymal scarring. Other: None IMPRESSION: 1. New hypodensity in the left occipital lobe inferiorly. This could be related to acute or subacute infarct. Vasogenic edema considered less likely but not excluded. MRI of the brain without and with contrast recommended to further evaluate. 2. Atrophy with chronic small vessel ischemic disease. 3. No evidence for cervical spine fracture or traumatic subluxation. Electronically Signed   By: Misty Stanley M.D.   On: 02/28/2023 08:43   CT Cervical Spine Wo Contrast  Result Date: 02/28/2023 CLINICAL DATA:  Syncope EXAM: CT HEAD WITHOUT CONTRAST CT CERVICAL SPINE WITHOUT CONTRAST TECHNIQUE: Multidetector CT imaging of the head and cervical spine was performed following the standard protocol without intravenous contrast. Multiplanar CT image reconstructions of the cervical spine were also generated. RADIATION DOSE REDUCTION: This exam was performed according to the departmental dose-optimization program which includes automated exposure control, adjustment of the mA and/or kV according to patient size and/or use of iterative reconstruction technique. COMPARISON:  No comparison studies available. FINDINGS: CT HEAD FINDINGS Brain: Diffuse loss of parenchymal volume is consistent with atrophy. Patchy low attenuation in the deep hemispheric and periventricular white matter is nonspecific, but likely reflects chronic microvascular ischemic demyelination. There is new hypodensity in the left occipital lobe inferiorly. Stable lacunar infarct right basal ganglia. Tiny gas locules in the region of the sella turcica presumably in the cavernous sinuses secondary to IV placement. Vascular: No hyperdense vessel  or unexpected calcification. Skull: No evidence for fracture. No worrisome lytic or sclerotic lesion. Sinuses/Orbits: Chronic mucosal thickening noted inferior right maxillary sinus. Visualized portions of the globes and intraorbital fat are unremarkable. Other: None. CT CERVICAL SPINE FINDINGS Alignment: Trace anterolisthesis of C7 on T1. Skull base and vertebrae: No acute fracture. No primary bone lesion or focal pathologic process. Soft tissues and spinal canal: No prevertebral fluid or swelling. No visible canal hematoma. Disc levels: Loss of disc height with endplate degeneration noted C6-7. Upper chest: Biapical pleuroparenchymal scarring. Other: None IMPRESSION: 1. New hypodensity in the left occipital lobe inferiorly. This could be related to acute or subacute infarct. Vasogenic edema considered less likely but not excluded. MRI of the brain without and with contrast recommended to further evaluate. 2. Atrophy with chronic small vessel ischemic disease. 3. No evidence for cervical spine fracture or traumatic subluxation. Electronically Signed   By: Misty Stanley M.D.   On: 02/28/2023 08:43   DG Chest 2 View  Result Date: 02/28/2023 CLINICAL DATA:  Syncope. EXAM: CHEST - 2 VIEW COMPARISON:  05/14/2020 FINDINGS: Normal heart size and mediastinal contours accounting for leftward rotation. No acute infiltrate or edema. No effusion or pneumothorax. No acute osseous findings. Remote lower thoracic compression fracture with cement augmentation. IMPRESSION: No evidence of acute disease. Electronically Signed   By: Jorje Guild M.D.   On: 02/28/2023 08:12    DISCHARGE EXAMINATION: See progress note from earlier today  DISPOSITION: SNF  Discharge Instructions     Ambulatory referral to Neurology   Complete by: As directed    An appointment is requested in approximately: 8 weeks   Call MD for:  difficulty breathing, headache or visual disturbances   Complete by: As directed    Call MD for:  extreme  fatigue   Complete by: As directed  Call MD for:  persistant dizziness or light-headedness   Complete by: As directed    Call MD for:  persistant nausea and vomiting   Complete by: As directed    Call MD for:  severe uncontrolled pain   Complete by: As directed    Call MD for:  temperature >100.4   Complete by: As directed    Diet - low sodium heart healthy   Complete by: As directed    Discharge instructions   Complete by: As directed    Please review instructions on the discharge summary.  You were cared for by a hospitalist during your hospital stay. If you have any questions about your discharge medications or the care you received while you were in the hospital after you are discharged, you can call the unit and asked to speak with the hospitalist on call if the hospitalist that took care of you is not available. Once you are discharged, your primary care physician will handle any further medical issues. Please note that NO REFILLS for any discharge medications will be authorized once you are discharged, as it is imperative that you return to your primary care physician (or establish a relationship with a primary care physician if you do not have one) for your aftercare needs so that they can reassess your need for medications and monitor your lab values. If you do not have a primary care physician, you can call [phone] for a physician referral.   Increase activity slowly   Complete by: As directed           Allergies as of 03/02/2023       Reactions   Amoxicillin Diarrhea, Nausea And Vomiting, Other (See Comments)   c-diff from antibiotics 2009   Ciprofloxacin Diarrhea, Nausea And Vomiting, Other (See Comments)   c-diff from antibiotics 2009   Clindamycin/lincomycin Diarrhea, Nausea And Vomiting, Other (See Comments)   c-diff from antiobiotics 2009   Codeine Nausea And Vomiting   Penicillins Hives, Swelling, Rash, Other (See Comments)   Has patient had a PCN reaction  causing immediate rash, facial/tongue/throat swelling, SOB or lightheadedness with hypotension: swelling of arm, and rash Has patient had a PCN reaction causing severe rash involving mucus membranes or skin necrosis: unknown Has patient had a PCN reaction that required hospitalization : unknown Has patient had a PCN reaction occurring within the last 10 years: no If all of the above answers are "NO", then may proceed with Cephalosporin use.   Tetanus Toxoids Hives   Reaction many years ago   Atorvastatin Diarrhea   Ultracet [tramadol-acetaminophen] Nausea And Vomiting        Medication List     STOP taking these medications    PRESCRIPTION MEDICATION       TAKE these medications    aspirin EC 81 MG tablet Take 1 tablet (81 mg total) by mouth daily. Swallow whole.   calcium carbonate 500 MG chewable tablet Commonly known as: TUMS - dosed in mg elemental calcium Chew 1 tablet by mouth daily.   cefdinir 300 MG capsule Commonly known as: OMNICEF Take 1 capsule (300 mg total) by mouth every 12 (twelve) hours for 5 days.   cyanocobalamin 1000 MCG tablet Commonly known as: VITAMIN B12 Take 1,000 mcg by mouth daily.   fluticasone 50 MCG/ACT nasal spray Commonly known as: FLONASE Place 2 sprays into both nostrils daily.   lactose free nutrition Liqd Take 237 mLs by mouth 2 (two) times daily between meals.   lisinopril  10 MG tablet Commonly known as: ZESTRIL Take 10 mg by mouth daily.   Lutein 6 MG Caps Take 6 mg by mouth daily.   melatonin 3 MG Tabs tablet Take 3 mg by mouth at bedtime as needed (sleep).   metroNIDAZOLE 500 MG tablet Commonly known as: FLAGYL Take 1 tablet (500 mg total) by mouth every 12 (twelve) hours for 5 days.   rosuvastatin 5 MG tablet Commonly known as: CRESTOR Take 1 tablet (5 mg total) by mouth daily.   saccharomyces boulardii 250 MG capsule Commonly known as: FLORASTOR Take 1 capsule (250 mg total) by mouth 2 (two) times daily.    SYSTANE OP Place 1 drop into both eyes 3 (three) times daily.   Vitamin D-3 125 MCG (5000 UT) Tabs Take 5,000 Units by mouth daily.          Contact information for follow-up providers     Garvin Fila, MD Follow up.   Specialties: Neurology, Radiology Contact information: [address] [place] [address] [phone]              Contact information for after-discharge care     Destination     [facility], [place] Preferred SNF .   Service: Skilled Nursing Contact information: [place] [phone]                     TOTAL DISCHARGE TIME: 48 minutes  [place]  [facility] Pager on [email or web address]  03/02/2023, 11:17 AM

## 2023-03-02 NOTE — Progress Notes (Addendum)
STROKE TEAM PROGRESS NOTE   INTERVAL HISTORY She is from [facility], PT/OT recommending continued SNF. She is sitting up to eat lunch on my exam and upset that she does not have her teeth. EP is planning for a loop recorder prior to discharge.  Discussed with patient and her son at the bedside No focal deficits on exam   Vitals:   03/01/23 2333 03/02/23 0000 03/02/23 0346 03/02/23 0718  BP: (!) 116/55  128/66 (!) 120/59  Pulse: 74  69 65  Resp: '16 16 16 16  '$ Temp: 98.7 F (37.1 C)  97.9 F (36.6 C) 98 F (36.7 C)  TempSrc: Oral  Oral Oral  SpO2: 97%  98% 98%  Weight:      Height:       CBC:  Recent Labs  Lab 02/28/23 0645 03/01/23 0428 03/02/23 0833  WBC 22.4* 6.5 4.6  NEUTROABS 20.0*  --   --   HGB 11.3* 9.3* 10.3*  HCT 36.0 28.5* 32.4*  MCV 88.9 86.9 87.3  PLT 364 324 [id number]    Basic Metabolic Panel:  Recent Labs  Lab 02/28/23 0645 03/01/23 0428  NA 131* 133*  K 4.6 3.8  CL 102 103  CO2 24 17*  GLUCOSE 146* 86  BUN 20 12  CREATININE 0.87 0.82  CALCIUM 8.8* 8.5*    Lipid Panel:  Recent Labs  Lab 03/01/23 0428  CHOL 148  TRIG 80  HDL 46  CHOLHDL 3.2  VLDL 16  LDLCALC 86    HgbA1c:  Recent Labs  Lab 02/28/23 1614  HGBA1C 6.0*   Urine Drug Screen: No results for input(s): "LABOPIA", "COCAINSCRNUR", "LABBENZ", "AMPHETMU", "THCU", "LABBARB" in the last 168 hours.  Alcohol Level No results for input(s): "ETH" in the last 168 hours.  IMAGING past 24 hours VAS [place] LOWER EXTREMITY VENOUS (DVT)  Result Date: 03/01/2023  Lower Venous DVT Study Patient Name:  [name] [facility]  Date of Exam:   03/01/2023 Medical Rec #: [id number]              Accession #:    [id number] Date of Birth: 11-04-1928              Patient Gender: F Patient Age:   89 years Exam Location:  [facility] Procedure:      VAS [place] LOWER EXTREMITY VENOUS (DVT) Referring Phys: Cornelius Moras Germaine Shenker --------------------------------------------------------------------------------  Indications:  Stroke.  Risk Factors: None identified. Comparison Study: No prior studies. Performing Technologist: Oliver Hum RVT  Examination Guidelines: A complete evaluation includes B-mode imaging, spectral Doppler, color Doppler, and power Doppler as needed of all accessible portions of each vessel. Bilateral testing is considered an integral part of a complete examination. Limited examinations for reoccurring indications may be performed as noted. The reflux portion of the exam is performed with the patient in reverse Trendelenburg.  +---------+---------------+---------+-----------+----------+--------------+ RIGHT    CompressibilityPhasicitySpontaneityPropertiesThrombus Aging +---------+---------------+---------+-----------+----------+--------------+ CFV      Full           Yes      Yes                                 +---------+---------------+---------+-----------+----------+--------------+ SFJ      Full                                                        +---------+---------------+---------+-----------+----------+--------------+  FV Prox  Full                                                        +---------+---------------+---------+-----------+----------+--------------+ FV Mid   Full                                                        +---------+---------------+---------+-----------+----------+--------------+ FV DistalFull                                                        +---------+---------------+---------+-----------+----------+--------------+ PFV      Full                                                        +---------+---------------+---------+-----------+----------+--------------+ POP      Full           Yes      Yes                                 +---------+---------------+---------+-----------+----------+--------------+ PTV      Full                                                         +---------+---------------+---------+-----------+----------+--------------+ PERO     Full                                                        +---------+---------------+---------+-----------+----------+--------------+   +---------+---------------+---------+-----------+----------+--------------+ LEFT     CompressibilityPhasicitySpontaneityPropertiesThrombus Aging +---------+---------------+---------+-----------+----------+--------------+ CFV      Full           Yes      Yes                                 +---------+---------------+---------+-----------+----------+--------------+ SFJ      Full                                                        +---------+---------------+---------+-----------+----------+--------------+ FV Prox  Full                                                        +---------+---------------+---------+-----------+----------+--------------+  FV Mid   Full                                                        +---------+---------------+---------+-----------+----------+--------------+ FV DistalFull                                                        +---------+---------------+---------+-----------+----------+--------------+ PFV      Full                                                        +---------+---------------+---------+-----------+----------+--------------+ POP      Full           Yes      Yes                                 +---------+---------------+---------+-----------+----------+--------------+ PTV      Full                                                        +---------+---------------+---------+-----------+----------+--------------+ PERO     Full                                                        +---------+---------------+---------+-----------+----------+--------------+     Summary: RIGHT: - There is no evidence of deep vein thrombosis in the lower extremity.  - No cystic structure found in  the popliteal fossa.  LEFT: - There is no evidence of deep vein thrombosis in the lower extremity.  - No cystic structure found in the popliteal fossa.  *See table(s) above for measurements and observations. Electronically signed by Servando Snare MD on 03/01/2023 at 7:23:47 PM.    Final    DG HIP UNILAT WITH PELVIS 1V LEFT  Result Date: 03/01/2023 CLINICAL DATA:  Pelvis and left hip pain. EXAM: DG HIP (WITH OR WITHOUT PELVIS) 1V*L* COMPARISON:  Pelvis and right hip radiographs 12/31/2022 FINDINGS: There is diffuse decreased bone mineralization. Mild bilateral superomedial femoroacetabular joint space narrowing is unchanged from prior. Mild bilateral sacroiliac subchondral sclerosis without significant joint space narrowing. The pubic symphysis joint space is maintained. No acute fracture is seen. No dislocation. IMPRESSION: 1. No acute fracture. 2. Mild bilateral femoroacetabular osteoarthritis. Electronically Signed   By: Yvonne Kendall M.D.   On: 03/01/2023 16:57    PHYSICAL EXAM  Physical Exam  Constitutional: Appears well-developed and well-nourished.   Cardiovascular: Normal rate and regular rhythm.  Respiratory: Effort normal, non-labored breathing  NEURO:  Mental Status: AA&Oto self, place. Correctly stated 74 for age, but 33 for year  Language: speech is clear.  Identifies objects on  her tray accurately Cranial Nerves: PERRL EOMI, visual fields full, no facial asymmetry, facial sensation intact, hearing intact, tongue/uvula/soft palate midline, normal sternocleidomastoid and trapezius muscle strength. No evidence of tongue atrophy or fibrillations Motor: 4/5 in all 4 extremities.  Tone: is normal and bulk is normal Sensation- Intact to light touch bilaterally Coordination: FTN intact bilaterally Gait- deferred    ASSESSMENT/PLAN [name] is a 89 y.o. female with history of memory issues, HTN, prior stroke, benign positional vertigo, osteoporosis presenting with  syncope. MRI brain revealed small acute infarcts in the right perisylvian and left parietal cortex and subacute left occipital infarct. She has no known history of atrial fibrillation.   Stroke:  Small acute infarcts in the right perisylvian fissure and left parietal cortex as well as a subacute infarct in the left occipital cortex with some hemorrhagic transformation Etiology:  likely embolic, concerning for cardioembolic source Code Stroke CT head Left occipital lobe hypodensity CTA head & neck Intracranial atherosclerotic disease resulting in short-segment severe stenosis of a right M3 branch, moderate stenosis of the left A2 segment, and focal severe stenosis of the left A3 segment. Mild atherosclerotic irregularity and narrowing elsewhere in the intracranial vasculature. Mild atherosclerotic plaque at the carotid bifurcations without hemodynamically significant stenosis or occlusion. MRI  Small acute infarcts in the right perisylvian and left parietal cortex and subacute left occipital infarct  2D Echo EF 65%-70% with LV Normal function  Lower extremity duplex- No DVT in lower extremity  Cardiology to place loop recorder prior to discharge LDL 86 HgbA1c 6.0 VTE prophylaxis - Lovenox No antithrombotic prior to admission, now on aspirin 81 mg daily.  No DAPT for now given hemorrhagic transformation. Therapy recommendations:  Return to SNF Disposition:  plan to return to SNF  Syncope Admitted for syncope Patient got up walk with walker to bathroom, became stumbled and loss of consciousness. EF 65 to 70% Orthostatic vitals showed no orthostatic hypotension  History of stroke 08/2016 admitted for right CR/as so infarct.  MRI negative.  Carotid Doppler negative.  2D echo EF 65 to 70%.  LDL 125, A1c 5.7.  Discharged on aspirin and Lipitor 10  Hypertension Home meds:  Lisinopril Stable BP goal less than 160 given hemorrhagic information Long-term BP goal normotensive  Hyperlipidemia LDL  86, goal < 70 Add Crestor '5mg'$   High intensity statin not indicated given advanced age and LDL not far from goal Continue statin at discharge  Other Stroke Risk Factors Advanced Age >/= 10  Hx stroke/TIA  Other Active Problems Decaying tooth Leukocytosis - 22.4->6.5->4.6 Dental follow up outpatient Abx: omnicef, flagyl Dementia Daughter reports poor short term memory Not on any medications for dementia/memory   Hospital day # 2  Patient seen and examined by NP/APP with MD. MD to update note as needed.   Janine Ores, DNP, FNP-BC [facility] Pager: [phone]   ATTENDING NOTE: I reviewed above note and agree with the assessment and plan. Pt was seen and examined.   Son at the bedside. Pt reclining in bed, no complains. Neuro stable. Cardiology EP discussed with pt and family about cardiac monitoring and they requested loop recorder which will be placed prior to discharge. Continue ASA and low dose crestor. Pt/OT recommend back to SNF.   For detailed assessment and plan, please refer to above/below as I have made changes wherever appropriate.   Neurology will sign off. Please call with questions. Pt will follow up with stroke clinic NP at [facility] in about 4  weeks. Thanks for the consult.  Rosalin Hawking, MD PhD Stroke Neurology 03/02/2023 4:00 PM      To contact Stroke Continuity provider, please refer to [email or web address] After hours, contact General Neurology

## 2023-03-02 NOTE — Progress Notes (Signed)
Mobility Specialist: Progress Note   03/02/23 1734  Mobility  Activity Ambulated with assistance to bathroom  Level of Assistance Minimal assist, patient does 75% or more  Assistive Device Front wheel walker  Distance Ambulated (ft) 60 ft  Activity Response Tolerated well  Mobility Referral Yes  $Mobility charge 1 Mobility   Pt received in the bed and agreeable to mobility. Mod I with bed mobility and minA to stand. To BR, void successful. C/o mild neck and Lt hip pain, otherwise asymptomatic. Pt back to bed after session with call bell and phone at her side. Bed alarm is on.   [place] Mobility Specialist Please contact via SecureChat or Rehab office at [phone]

## 2023-03-03 ENCOUNTER — Encounter (HOSPITAL_COMMUNITY): Payer: Self-pay | Admitting: Cardiovascular Disease

## 2023-03-03 ENCOUNTER — Telehealth: Payer: Self-pay

## 2023-03-03 NOTE — Telephone Encounter (Signed)
Jeanne Ivan from [facility] called with questions about the dressing. I let him speak with Benjamine Mola, rn.

## 2023-03-03 NOTE — Telephone Encounter (Signed)
Spoke with [name] informed her that it was normal for the dressing to look bloody, she stated it was not actively bleeding advised her the clear tegarderm could come off tomorrow and there steri-strips should fall off on their own but if not they can use adhesive remover to removed in 7/10 days. Advised her that the patient is not to get the incision site wet. [name] is going to try and send a transmission to ensure monitor connectivity.

## 2023-03-03 NOTE — Progress Notes (Signed)
(  Late entry)  03/02/2023 1400, pt and family educated on [facility] S/S of stroke and 911 activation. Individual risk factors discussed.  Holland Commons RN  Stroke Response

## 2023-03-04 NOTE — Telephone Encounter (Signed)
Pt is connected in [place].  Scheduled monthly remote checks.

## 2023-03-05 LAB — CULTURE, BLOOD (SINGLE)
Culture: NO GROWTH
Special Requests: ADEQUATE

## 2023-03-06 DIAGNOSIS — R531 Weakness: Secondary | ICD-10-CM | POA: Diagnosis not present

## 2023-03-06 DIAGNOSIS — N183 Chronic kidney disease, stage 3 unspecified: Secondary | ICD-10-CM | POA: Diagnosis not present

## 2023-03-06 DIAGNOSIS — I130 Hypertensive heart and chronic kidney disease with heart failure and stage 1 through stage 4 chronic kidney disease, or unspecified chronic kidney disease: Secondary | ICD-10-CM | POA: Diagnosis not present

## 2023-03-06 DIAGNOSIS — I639 Cerebral infarction, unspecified: Secondary | ICD-10-CM | POA: Diagnosis not present

## 2023-04-05 ENCOUNTER — Ambulatory Visit (INDEPENDENT_AMBULATORY_CARE_PROVIDER_SITE_OTHER): Payer: Medicare Other

## 2023-04-05 DIAGNOSIS — I639 Cerebral infarction, unspecified: Secondary | ICD-10-CM

## 2023-04-06 LAB — CUP PACEART REMOTE DEVICE CHECK
Date Time Interrogation Session: 20240408172116
Implantable Pulse Generator Implant Date: 20240305

## 2023-04-07 ENCOUNTER — Encounter: Payer: Self-pay | Admitting: Family Medicine

## 2023-04-07 ENCOUNTER — Ambulatory Visit (INDEPENDENT_AMBULATORY_CARE_PROVIDER_SITE_OTHER): Payer: Medicare Other | Admitting: Family Medicine

## 2023-04-07 VITALS — BP 134/74 | HR 101

## 2023-04-07 DIAGNOSIS — K047 Periapical abscess without sinus: Secondary | ICD-10-CM | POA: Diagnosis not present

## 2023-04-07 DIAGNOSIS — I10 Essential (primary) hypertension: Secondary | ICD-10-CM

## 2023-04-07 DIAGNOSIS — I639 Cerebral infarction, unspecified: Secondary | ICD-10-CM | POA: Diagnosis not present

## 2023-04-07 DIAGNOSIS — F0390 Unspecified dementia without behavioral disturbance: Secondary | ICD-10-CM | POA: Diagnosis not present

## 2023-04-07 NOTE — Patient Instructions (Addendum)
Below is our plan:  Stroke:  Small acute infarcts in the right perisylvian fissure and left parietal cortex as well as a subacute infarct in the left occipital cortex with some hemorrhagic transformation Etiology:  likely embolic, concerning for cardioembolic source : Residual deficit: none. Continue aspirin 81 mg daily  and rosuvastatin 5mg   for secondary stroke prevention.  Discussed secondary stroke prevention measures and importance of close PCP follow up for aggressive stroke risk factor management. I have gone over the pathophysiology of stroke, warning signs and symptoms, risk factors and their management in some detail with instructions to go to the closest emergency room for symptoms of concern. HTN: BP goal <130/90.  Stable on lisinopril per PCP HLD: LDL goal <70. Recent LDL 86. Continue rosuvastatin 5mg  daily per PCP. DMII: A1c goal<7.0. Recent A1c 6. Prediabetes, continue to monitor per PCP.  Tooth decay. Continue close follow up with dentistry.  Dementia. continue to monitor, memory compensation strategies   Loop recorder placement. Continue follow up with cardiology.   Please make sure you are staying well hydrated. I recommend 50-60 ounces daily. Well balanced diet and regular exercise encouraged. Consistent sleep schedule with 6-8 hours recommended.   Please continue follow up with care team as directed.   Follow up with me as needed   You may receive a survey regarding today's visit. I encourage you to leave honest feed back as I do use this information to improve patient care. Thank you for seeing me today!   Management of Memory Problems   There are some general things you can do to help manage your memory problems.  Your memory may not in fact recover, but by using techniques and strategies you will be able to manage your memory difficulties better.   1)  Establish a routine. Try to establish and then stick to a regular routine.  By doing this, you will get used to what to  expect and you will reduce the need to rely on your memory.  Also, try to do things at the same time of day, such as taking your medication or checking your calendar first thing in the morning. Think about think that you can do as a part of a regular routine and make a list.  Then enter them into a daily planner to remind you.  This will help you establish a routine.   2)  Organize your environment. Organize your environment so that it is uncluttered.  Decrease visual stimulation.  Place everyday items such as keys or cell phone in the same place every day (ie.  Basket next to front door) Use post it notes with a brief message to yourself (ie. Turn off light, lock the door) Use labels to indicate where things go (ie. Which cupboards are for food, dishes, etc.) Keep a notepad and pen by the telephone to take messages   3)  Memory Aids A diary or journal/notebook/daily planner Making a list (shopping list, chore list, to do list that needs to be done) Using an alarm as a reminder (kitchen timer or cell phone alarm) Using cell phone to store information (Notes, Calendar, Reminders) Calendar/White board placed in a prominent position Post-it notes   In order for memory aids to be useful, you need to have good habits.  It's no good remembering to make a note in your journal if you don't remember to look in it.  Try setting aside a certain time of day to look in journal.   4)  Improving mood and managing fatigue. There may be other factors that contribute to memory difficulties.  Factors, such as anxiety, depression and tiredness can affect memory. Regular gentle exercise can help improve your mood and give you more energy. Exercise: there are short videos created by the [employer] on Health specially for older adults: https://bit.ly/2I30q97.  Mediterranean diet: which emphasizes fruits, vegetables, whole grains, legumes, fish, and other seafood; unsaturated fats such as olive oils; and low  amounts of red meat, eggs, and sweets. A variation of this, called MIND (Mediterranean-DASH Intervention for Neurodegenerative Delay) incorporates the DASH (Dietary Approaches to Stop Hypertension) diet, which has been shown to lower high blood pressure, a risk factor for Alzheimer's disease. More information at: ExitMarketing.de.  Aerobic exercise that improve heart health is also good for the mind.  [employer] on Aging have short videos for exercises that you can do at home: BlindWorkshop.com.pt Simple relaxation techniques may help relieve symptoms of anxiety Try to get back to completing activities or hobbies you enjoyed doing in the past. Learn to pace yourself through activities to decrease fatigue. Find out about some local support groups where you can share experiences with others. Try and achieve 7-8 hours of sleep at night.   Resources for Family/Caregiver  Online caregiver support groups can be found at WesternTunes.it or call Alzheimer's Association's 24/7 hotline: 601-034-6807. [facility] Memory Counseling Program offers in-person, virtual support groups and individual counseling for both care partners and persons with memory loss. Call for more information at [phone].   Advanced care plan: there are two types of Power of Attorney: healthcare and durable. Healthcare POA is a designated person to make healthcare decisions on your behalf if you were too sick to make them yourself. This person can be selected and documented by your physician. Durable POA has to be set up with a lawyer who takes charge of your finances and estate if you were too sick or cognitively impaired to manage your finances accurately. You can find a local Elder Therapist, art here: NewportRanch.at.  Check out www.planyourlifespan.org, which will help you plan before a crisis and decide who will take care of life considerations in a  circumstance where you may not be able to speak for yourself.   Helpful books (available on [employer] or your local bookstore):  By Dr. Carl Best: Keeping Love Alive as Memories Fade: The 5 Love Languages and the Alzheimer's Journey Sep 28, 2015 The Dementia Care Partner's Workbook: A Guide for Understanding, Education, and [employer] - May 28, 2018.  Both available for less than $15.   "Coping with behavior change in dementia: a family caregiver's guide" by Ricardo Jericho & Valora Piccolo "A Caregiver's Guide to Dementia: Using Activities and Other Strategies to Prevent, Reduce and Manage Behavioral Symptoms" by Mahala Menghini Gitlin and [employer].  Youth worker of Joy for the Person with Alzheimer's or Dementia" 4th edition by [employer] High  Caregiver videos on common behaviors related to dementia: PopulationGame.pl  [facility] Caregiver Portal: free to sign up, links to local resources: [email or web address]

## 2023-04-07 NOTE — Progress Notes (Signed)
[facility] [address] [place]. [place] [address] [phone]       HOSPITAL FOLLOW UP NOTE  Ms. [name] Date of Birth:  [birth year] Medical Record Number:  [id number]   Reason for Referral:  hospital stroke follow up    SUBJECTIVE:   CHIEF COMPLAINT:  Chief Complaint  Patient presents with   Follow-up    Pt in room 16, activity assistant from [facility] in room. Here for hospital follow up for CVA. Pt in in wheelchair lives a [facility]. Pt has headache.    HPI:   [name] is a 89 y.o. who  has a past medical history of Benign positional vertigo, Bursitis (started 10-2011), Clostridium difficile diarrhea, DJD (degenerative joint disease), Family history of adverse reaction to anesthesia, Hypertension, Leg cramps, Osteoporosis, Pneumonia, PONV (postoperative nausea and vomiting), Stroke, and Varicose veins.  Patient presented on 02/28/2023 following an episode of unresponsiveness at SNF (resident of [facility]). Facility reports she was being assisted to bathroom the night prior and suddenly clenched her teeth and lost consciousness. She was caught and did not hit her head. EMS called next morning for nausea/diarrhea and vomiting. CT of the brain and cervical spine showed a new hypodensity in the left occipital lobe inferiorly concerning for acute or subacute infarct. MRI showed small acute infarcts in the right perisylvian and left parietal cortex with moderate subacute infarct in left occipital cortex with evidence of chronic ischemic injury with multiple chronic infarcts noted. Not a candidate for tPA due to time of onset. PT/OT recommended through [facility]. Loop recorder placed 03/02/2023. She was discharged to SNF Personally reviewed hospitalization pertinent progress notes, lab work and imaging.  Evaluated by Roda Shutters.   She presents with Tiny, Lobbyist at [employer]. She is not able to provide much hisotry and patient unable to assist.    [name] reports doing well. Prior to stroke she could ambulate with walker and used wheelchair for longer distances. Since discharge, she is more sedentary. Tiny reports that she likes to stay in the bed. She is working with PT/OT. Ambulates with walker during therapy, uses wheelchair mostly. Short term memory poor. She continues asa 81mg  and rosuvastatin 5mg  daily. She reports sleeping well. Appetite is reduced. Follow up with cardiology scheduled 05/06/2023. Patient tells me infected tooth was removed. She denies pain.    PERTINENT IMAGING/LABS  Code Stroke CT head Left occipital lobe hypodensity CTA head & neck Intracranial atherosclerotic disease resulting in short-segment severe stenosis of a right M3 branch, moderate stenosis of the left A2 segment, and focal severe stenosis of the left A3 segment. Mild atherosclerotic irregularity and narrowing elsewhere in the intracranial vasculature. Mild atherosclerotic plaque at the carotid bifurcations without hemodynamically significant stenosis or occlusion. MRI  Small acute infarcts in the right perisylvian and left parietal cortex and subacute left occipital infarct  2D Echo EF 65%-70% with LV Normal function  Lower extremity duplex- No DVT in lower extremity  loop recorder placed 03/02/2023   A1C Lab Results  Component Value Date   HGBA1C 6.0 (H) 02/28/2023    Lipid Panel     Component Value Date/Time   CHOL 148 03/01/2023 0428   TRIG 80 03/01/2023 0428   HDL 46 03/01/2023 0428   CHOLHDL 3.2 03/01/2023 0428   VLDL 16 03/01/2023 0428   LDLCALC 86 03/01/2023 0428      ROS:   14 system review of systems performed and negative with exception of those listed in HPI  PMH:  Past Medical History:  Diagnosis Date   Benign positional vertigo    Bursitis started 10-2011   left leg and left hip   Clostridium difficile diarrhea    hx of 10 years ago   DJD (degenerative joint disease)    Family history of adverse reaction to anesthesia     daughter has nausea   Hypertension    Leg cramps    Osteoporosis    Pneumonia    as a baby   PONV (postoperative nausea and vomiting)    Stroke    Varicose veins     PSH:  Past Surgical History:  Procedure Laterality Date   ABDOMINAL HYSTERECTOMY     APPENDECTOMY     CATARACT EXTRACTION     right   COLONOSCOPY     HERNIA REPAIR     KYPHOSIS SURGERY     LOOP RECORDER INSERTION N/A 03/02/2023   Procedure: LOOP RECORDER INSERTION;  Surgeon: Maurice SmallMealor, Augustus E, MD;  Location: [facility] INVASIVE CV [facility];  Service: Cardiovascular;  Laterality: N/A;   ORIF WRIST FRACTURE Left 12/24/2016   Procedure: OPEN REDUCTION INTERNAL FIXATION (ORIF) LEFT WRIST FRACTURE;  Surgeon: Dominica SeverinWilliam Gramig, MD;  Location: [facility] OR;  Service: Orthopedics;  Laterality: Left;   TUBAL LIGATION     bilateral    Social History:  Social History   Socioeconomic History   Marital status: Widowed    Spouse name: Not on file   Number of children: 3   Years of education: 312   Highest education level: Not on file  Occupational History    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  Tobacco Use   Smoking status: Never   Smokeless tobacco: Never  Substance and Sexual Activity   Alcohol use: No   Drug use: No   Sexual activity: Not on file  Other Topics Concern   Not on file  Social History Narrative   Lives alone    Social Determinants of Health   Financial Resource Strain: Not on file  Food Insecurity: No Food Insecurity (03/01/2023)   Hunger Vital Sign    Worried About Running Out of Food in the Last Year: Never true    Ran Out of Food in the Last Year: Never true  Transportation Needs: No Transportation Needs (03/01/2023)   PRAPARE - Administrator, Civil ServiceTransportation    Lack of Transportation (Medical): No    Lack of Transportation (Non-Medical): No  Physical Activity: Not on file  Stress: Not on file  Social Connections: Not on file  Intimate Partner Violence: Not At Risk (03/01/2023)   Humiliation, Afraid, Rape, and Kick questionnaire     Fear of Current or Ex-Partner: No    Emotionally Abused: No    Physically Abused: No    Sexually Abused: No    Family History:  Family History  Problem Relation Age of Onset   Alzheimer's disease Mother    Stroke Mother    Coronary artery disease Father    Ulcers Father     Medications:   Current Outpatient Medications on File Prior to Visit  Medication Sig Dispense Refill   aspirin EC 81 MG tablet Take 1 tablet (81 mg total) by mouth daily. Swallow whole. 30 tablet 2   calcium carbonate (TUMS - DOSED IN MG ELEMENTAL CALCIUM) 500 MG chewable tablet Chew 1 tablet by mouth daily.     Cholecalciferol (VITAMIN D-3) 5000 units TABS Take 5,000 Units by mouth daily.     fluticasone (FLONASE) 50 MCG/ACT nasal spray Place 2  sprays into both nostrils daily.     lactose free nutrition (BOOST) LIQD Take 237 mLs by mouth 2 (two) times daily between meals.     lisinopril (PRINIVIL,ZESTRIL) 10 MG tablet Take 10 mg by mouth daily.     Lutein 6 MG CAPS Take 6 mg by mouth daily.       melatonin 3 MG TABS tablet Take 3 mg by mouth at bedtime as needed (sleep).     Polyethyl Glycol-Propyl Glycol (SYSTANE OP) Place 1 drop into both eyes 3 (three) times daily.     rosuvastatin (CRESTOR) 5 MG tablet Take 1 tablet (5 mg total) by mouth daily.     saccharomyces boulardii (FLORASTOR) 250 MG capsule Take 1 capsule (250 mg total) by mouth 2 (two) times daily.     vitamin B-12 (CYANOCOBALAMIN) 1000 MCG tablet Take 1,000 mcg by mouth daily.       No current facility-administered medications on file prior to visit.    Allergies:   Allergies  Allergen Reactions   Amoxicillin Diarrhea, Nausea And Vomiting and Other (See Comments)    c-diff from antibiotics 2009   Ciprofloxacin Diarrhea, Nausea And Vomiting and Other (See Comments)    c-diff from antibiotics 2009   Clindamycin/Lincomycin Diarrhea, Nausea And Vomiting and Other (See Comments)    c-diff from antiobiotics 2009   Codeine Nausea And Vomiting    Penicillins Hives, Swelling, Rash and Other (See Comments)    Has patient had a PCN reaction causing immediate rash, facial/tongue/throat swelling, SOB or lightheadedness with hypotension: swelling of arm, and rash Has patient had a PCN reaction causing severe rash involving mucus membranes or skin necrosis: unknown Has patient had a PCN reaction that required hospitalization : unknown Has patient had a PCN reaction occurring within the last 10 years: no If all of the above answers are "NO", then may proceed with Cephalosporin use.    Tetanus Toxoids Hives    Reaction many years ago   Atorvastatin Diarrhea   Ultracet [Tramadol-Acetaminophen] Nausea And Vomiting      OBJECTIVE:  Physical Exam  Vitals:   04/07/23 0845  BP: 134/74  Pulse: (!) 101   There is no height or weight on file to calculate BMI. No results found.      No data to display           General: well developed, well nourished, seated, in no evident distress Head: head normocephalic and atraumatic.   Neck: supple with no carotid or supraclavicular bruits Cardiovascular: regular rate and rhythm, no murmurs Musculoskeletal: no deformity Skin:  no rash/petichiae Vascular:  Normal pulses all extremities   Neurologic Exam Mental Status: Awake and fully alert.  Fluent speech and language.  Not oriented to place or time. Recent and remote memory impaired. Attention span appropriate, easily distracted and fund of knowledge limited.. Mood and affect appropriate.  Cranial Nerves: Fundoscopic exam reveals sharp disc margins. Pupils equal, briskly reactive to light. Extraocular movements full without nystagmus. Visual fields full to confrontation. Hearing impaired. Facial sensation intact. Face, tongue, palate moves normally and symmetrically.  Motor: Normal bulk and tone. Normal strength in bilateral upper extremity muscles, 3/5 left hip flexion, 4/5 right hip flexion Sensory.: intact to touch  Coordination: unable to  assess due to cognitive deficits Gait and Station: presents in wheelchair, unable to stand unassisted, gait not evaluated   NIHSS  2 Modified Rankin  4    ASSESSMENT: [name] is a 89 y.o. year old female presenting  on 02/28/2023 following an episode of unresponsiveness the night prior and nausea/vomining and diarrhea the morning of. Vascular risk factors include HTN, HLD, prediabetes, prior hx of CVA.    PLAN:  Stroke:  Small acute infarcts in the right perisylvian fissure and left parietal cortex as well as a subacute infarct in the left occipital cortex with some hemorrhagic transformation Etiology:  likely embolic, concerning for cardioembolic source : Residual deficit: none. Continue aspirin 81 mg daily  and rosuvastatin 5mg   for secondary stroke prevention.  Discussed secondary stroke prevention measures and importance of close PCP follow up for aggressive stroke risk factor management. I have gone over the pathophysiology of stroke, warning signs and symptoms, risk factors and their management in some detail with instructions to go to the closest emergency room for symptoms of concern. HTN: BP goal <130/90.  Stable on lisinopril per PCP HLD: LDL goal <70. Recent LDL 86. Continue rosuvastatin 5mg  daily per PCP. DMII: A1c goal<7.0. Recent A1c 6. Prediabetes, continue to monitor per PCP.  Tooth decay. Continue close follow up with dentistry.  Dementia. continue to monitor, memory compensation strategies   Loop recorder placement. Continue follow up with cardiology.    Follow up as needed   CC:  [facility] provider: Dr. Pearlean Brownie PCP: Kimberly Martyr, MD (Inactive)    I spent 45 minutes of face-to-face and non-face-to-face time with patient.  This included previsit chart review including review of recent hospitalization, lab review, study review, order entry, electronic health record documentation, patient education regarding recent stroke including etiology, secondary stroke  prevention measures and importance of managing stroke risk factors, residual deficits and typical recovery time and answered all other questions to patient satisfaction   Shawnie Dapper, [facility]  [facility] [address] [place], [place] [address]  Phone [phone] Fax [phone] Note: This document was prepared with digital dictation and possible smart phrase technology. Any transcriptional errors that result from this process are unintentional.

## 2023-05-06 ENCOUNTER — Ambulatory Visit (INDEPENDENT_AMBULATORY_CARE_PROVIDER_SITE_OTHER): Payer: Medicare Other

## 2023-05-06 DIAGNOSIS — I639 Cerebral infarction, unspecified: Secondary | ICD-10-CM | POA: Diagnosis not present

## 2023-05-10 LAB — CUP PACEART REMOTE DEVICE CHECK
Date Time Interrogation Session: 20240511172110
Implantable Pulse Generator Implant Date: 20240305

## 2023-05-12 NOTE — Progress Notes (Signed)
[facility] Summary Report / Loop Recorder 

## 2023-05-25 NOTE — Progress Notes (Signed)
[facility] Summary Report / Loop Recorder 

## 2023-06-10 ENCOUNTER — Ambulatory Visit (INDEPENDENT_AMBULATORY_CARE_PROVIDER_SITE_OTHER): Payer: Medicare Other

## 2023-06-10 DIAGNOSIS — I639 Cerebral infarction, unspecified: Secondary | ICD-10-CM

## 2023-06-11 LAB — CUP PACEART REMOTE DEVICE CHECK
Date Time Interrogation Session: 20240613172112
Implantable Pulse Generator Implant Date: 20240305

## 2023-06-16 DIAGNOSIS — N183 Chronic kidney disease, stage 3 unspecified: Secondary | ICD-10-CM | POA: Diagnosis not present

## 2023-06-16 DIAGNOSIS — M6281 Muscle weakness (generalized): Secondary | ICD-10-CM | POA: Diagnosis not present

## 2023-06-16 DIAGNOSIS — I130 Hypertensive heart and chronic kidney disease with heart failure and stage 1 through stage 4 chronic kidney disease, or unspecified chronic kidney disease: Secondary | ICD-10-CM | POA: Diagnosis not present

## 2023-07-05 NOTE — Progress Notes (Signed)
[facility] Summary Report / Loop Recorder 

## 2023-07-12 ENCOUNTER — Ambulatory Visit (INDEPENDENT_AMBULATORY_CARE_PROVIDER_SITE_OTHER): Payer: Medicare Other

## 2023-07-12 DIAGNOSIS — I639 Cerebral infarction, unspecified: Secondary | ICD-10-CM

## 2023-07-13 LAB — CUP PACEART REMOTE DEVICE CHECK
Date Time Interrogation Session: 20240714231232
Implantable Pulse Generator Implant Date: 20240305

## 2023-07-26 NOTE — Progress Notes (Signed)
[facility] Summary Report / Loop Recorder 

## 2023-08-12 ENCOUNTER — Ambulatory Visit (INDEPENDENT_AMBULATORY_CARE_PROVIDER_SITE_OTHER): Payer: Medicare Other

## 2023-08-12 DIAGNOSIS — I639 Cerebral infarction, unspecified: Secondary | ICD-10-CM

## 2023-08-12 LAB — CUP PACEART REMOTE DEVICE CHECK
Date Time Interrogation Session: 20240814230938
Implantable Pulse Generator Implant Date: 20240305

## 2023-08-24 NOTE — Progress Notes (Signed)
[facility] Summary Report / Loop Recorder 

## 2023-09-13 ENCOUNTER — Ambulatory Visit (INDEPENDENT_AMBULATORY_CARE_PROVIDER_SITE_OTHER): Payer: Medicare Other

## 2023-09-13 DIAGNOSIS — I639 Cerebral infarction, unspecified: Secondary | ICD-10-CM

## 2023-09-27 NOTE — Progress Notes (Signed)
[facility] Summary Report / Loop Recorder 

## 2023-10-17 LAB — CUP PACEART REMOTE DEVICE CHECK
Date Time Interrogation Session: 20241019231047
Implantable Pulse Generator Implant Date: 20240305

## 2023-10-18 ENCOUNTER — Ambulatory Visit: Payer: Medicare Other

## 2023-10-18 DIAGNOSIS — I639 Cerebral infarction, unspecified: Secondary | ICD-10-CM | POA: Diagnosis not present

## 2023-11-03 NOTE — Progress Notes (Signed)
[facility] Summary Report / Loop Recorder 

## 2023-11-19 LAB — CUP PACEART REMOTE DEVICE CHECK
Date Time Interrogation Session: 20241121231511
Implantable Pulse Generator Implant Date: 20240305

## 2023-11-22 ENCOUNTER — Ambulatory Visit (INDEPENDENT_AMBULATORY_CARE_PROVIDER_SITE_OTHER): Payer: Medicare Other

## 2023-11-22 DIAGNOSIS — I639 Cerebral infarction, unspecified: Secondary | ICD-10-CM | POA: Diagnosis not present

## 2023-12-20 NOTE — Progress Notes (Signed)
[facility] Summary Report / Loop Recorder 

## 2023-12-27 ENCOUNTER — Ambulatory Visit (INDEPENDENT_AMBULATORY_CARE_PROVIDER_SITE_OTHER): Payer: Medicare Other

## 2023-12-27 DIAGNOSIS — I639 Cerebral infarction, unspecified: Secondary | ICD-10-CM | POA: Diagnosis not present

## 2023-12-28 LAB — CUP PACEART REMOTE DEVICE CHECK
Date Time Interrogation Session: 20241229231739
Implantable Pulse Generator Implant Date: 20240305

## 2024-01-31 ENCOUNTER — Ambulatory Visit (INDEPENDENT_AMBULATORY_CARE_PROVIDER_SITE_OTHER): Payer: Medicare Other

## 2024-01-31 DIAGNOSIS — I639 Cerebral infarction, unspecified: Secondary | ICD-10-CM | POA: Diagnosis not present

## 2024-01-31 LAB — CUP PACEART REMOTE DEVICE CHECK
Date Time Interrogation Session: 20250202231922
Implantable Pulse Generator Implant Date: 20240305

## 2024-03-03 ENCOUNTER — Telehealth: Payer: Self-pay

## 2024-03-03 NOTE — Telephone Encounter (Signed)
 Alert remote transmission: tachy Event occurred 3/6 @ 11:11, duration 7sec HR 158-167, NCT - route to triage Follow up as scheduled. LA, CVRS   Looks like SVT w/ some irregularity at the end. Pt is in SNF w/ no OAC following acute CVA. Sent to MD for review. Most of the episode is below detection as seen in the plot graph.

## 2024-03-06 ENCOUNTER — Ambulatory Visit: Payer: Medicare Other

## 2024-03-07 LAB — CUP PACEART REMOTE DEVICE CHECK
Date Time Interrogation Session: 20250309231820
Implantable Pulse Generator Implant Date: 20240305

## 2024-03-07 NOTE — Telephone Encounter (Signed)
 All pt remote appointments has been canceled. Pt marked deceased in [facility] and has been taken out of [facility].

## 2024-03-07 NOTE — Telephone Encounter (Signed)
 Increase note in heart rate trends.   Spoke to patients nurse at facility to inquire further. States patient has passed away. Offered condolences.

## 2024-03-08 NOTE — Progress Notes (Signed)
 [facility] Summary Report / Loop Recorder

## 2024-03-28 DEATH — deceased
